# Patient Record
Sex: Female | Born: 1950 | Race: White | Hispanic: No | Marital: Single | Smoking: Former smoker
Health system: Southern US, Community
[De-identification: ages and names within clinical notes are randomized; demographics above are authoritative.]

## PROBLEM LIST (undated history)

## (undated) DIAGNOSIS — I639 Cerebral infarction, unspecified: Secondary | ICD-10-CM

## (undated) DIAGNOSIS — J189 Pneumonia, unspecified organism: Secondary | ICD-10-CM

## (undated) DIAGNOSIS — I10 Essential (primary) hypertension: Secondary | ICD-10-CM

## (undated) DIAGNOSIS — I219 Acute myocardial infarction, unspecified: Secondary | ICD-10-CM

## (undated) DIAGNOSIS — R092 Respiratory arrest: Secondary | ICD-10-CM

## (undated) DIAGNOSIS — M797 Fibromyalgia: Secondary | ICD-10-CM

## (undated) DIAGNOSIS — M353 Polymyalgia rheumatica: Secondary | ICD-10-CM

## (undated) DIAGNOSIS — Z9289 Personal history of other medical treatment: Secondary | ICD-10-CM

## (undated) DIAGNOSIS — I200 Unstable angina: Secondary | ICD-10-CM

## (undated) DIAGNOSIS — I2510 Atherosclerotic heart disease of native coronary artery without angina pectoris: Secondary | ICD-10-CM

## (undated) DIAGNOSIS — I509 Heart failure, unspecified: Secondary | ICD-10-CM

## (undated) DIAGNOSIS — J449 Chronic obstructive pulmonary disease, unspecified: Secondary | ICD-10-CM

## (undated) DIAGNOSIS — Z720 Tobacco use: Secondary | ICD-10-CM

## (undated) DIAGNOSIS — F329 Major depressive disorder, single episode, unspecified: Secondary | ICD-10-CM

## (undated) DIAGNOSIS — F32A Depression, unspecified: Secondary | ICD-10-CM

## (undated) DIAGNOSIS — I739 Peripheral vascular disease, unspecified: Secondary | ICD-10-CM

## (undated) DIAGNOSIS — K219 Gastro-esophageal reflux disease without esophagitis: Secondary | ICD-10-CM

## (undated) DIAGNOSIS — H9190 Unspecified hearing loss, unspecified ear: Secondary | ICD-10-CM

## (undated) HISTORY — DX: Peripheral vascular disease, unspecified: I73.9

## (undated) HISTORY — DX: Personal history of other medical treatment: Z92.89

## (undated) HISTORY — PX: EYE SURGERY: SHX253

## (undated) HISTORY — DX: Tobacco use: Z72.0

---

## 1993-03-14 HISTORY — PX: CORONARY ANGIOPLASTY: SHX604

## 1993-04-03 HISTORY — PX: CORONARY ARTERY BYPASS GRAFT: SHX141

## 1996-03-14 HISTORY — PX: CORONARY ANGIOPLASTY: SHX604

## 1997-03-14 HISTORY — PX: CORONARY ANGIOPLASTY: SHX604

## 1997-09-25 ENCOUNTER — Inpatient Hospital Stay (HOSPITAL_COMMUNITY): Admission: EM | Admit: 1997-09-25 | Discharge: 1997-09-27 | Payer: Self-pay | Admitting: Emergency Medicine

## 1998-07-27 ENCOUNTER — Encounter: Payer: Self-pay | Admitting: Gastroenterology

## 1998-07-27 ENCOUNTER — Ambulatory Visit (HOSPITAL_COMMUNITY): Admission: RE | Admit: 1998-07-27 | Discharge: 1998-07-27 | Payer: Self-pay | Admitting: Gastroenterology

## 1998-07-28 ENCOUNTER — Encounter: Payer: Self-pay | Admitting: Gastroenterology

## 1998-07-28 ENCOUNTER — Ambulatory Visit (HOSPITAL_COMMUNITY): Admission: RE | Admit: 1998-07-28 | Discharge: 1998-07-28 | Payer: Self-pay | Admitting: Gastroenterology

## 2000-09-07 ENCOUNTER — Inpatient Hospital Stay (HOSPITAL_COMMUNITY): Admission: EM | Admit: 2000-09-07 | Discharge: 2000-09-09 | Payer: Self-pay | Admitting: Emergency Medicine

## 2000-09-08 HISTORY — PX: CORONARY ANGIOPLASTY: SHX604

## 2000-10-19 ENCOUNTER — Other Ambulatory Visit: Admission: RE | Admit: 2000-10-19 | Discharge: 2000-10-19 | Payer: Self-pay | Admitting: *Deleted

## 2001-07-18 ENCOUNTER — Encounter: Payer: Self-pay | Admitting: *Deleted

## 2001-07-18 ENCOUNTER — Encounter: Admission: RE | Admit: 2001-07-18 | Discharge: 2001-07-18 | Payer: Self-pay | Admitting: *Deleted

## 2001-10-24 ENCOUNTER — Encounter: Admission: RE | Admit: 2001-10-24 | Discharge: 2001-10-24 | Payer: Self-pay | Admitting: Pulmonary Disease

## 2002-06-06 ENCOUNTER — Encounter: Payer: Self-pay | Admitting: Specialist

## 2002-06-10 ENCOUNTER — Ambulatory Visit (HOSPITAL_COMMUNITY): Admission: RE | Admit: 2002-06-10 | Discharge: 2002-06-10 | Payer: Self-pay | Admitting: Specialist

## 2002-10-28 ENCOUNTER — Encounter (HOSPITAL_COMMUNITY): Admission: RE | Admit: 2002-10-28 | Discharge: 2003-01-26 | Payer: Self-pay | Admitting: Cardiovascular Disease

## 2002-12-03 ENCOUNTER — Ambulatory Visit (HOSPITAL_COMMUNITY): Admission: RE | Admit: 2002-12-03 | Discharge: 2002-12-04 | Payer: Self-pay | Admitting: Cardiovascular Disease

## 2002-12-03 HISTORY — PX: CORONARY ANGIOPLASTY: SHX604

## 2004-06-15 ENCOUNTER — Emergency Department (HOSPITAL_COMMUNITY): Admission: EM | Admit: 2004-06-15 | Discharge: 2004-06-15 | Payer: Self-pay | Admitting: Emergency Medicine

## 2004-09-15 ENCOUNTER — Observation Stay (HOSPITAL_COMMUNITY): Admission: EM | Admit: 2004-09-15 | Discharge: 2004-09-18 | Payer: Self-pay | Admitting: Emergency Medicine

## 2004-09-17 HISTORY — PX: CORONARY ANGIOPLASTY: SHX604

## 2004-11-19 ENCOUNTER — Encounter: Admission: RE | Admit: 2004-11-19 | Discharge: 2004-11-19 | Payer: Self-pay | Admitting: Internal Medicine

## 2004-12-06 ENCOUNTER — Ambulatory Visit: Payer: Self-pay | Admitting: Gastroenterology

## 2004-12-10 ENCOUNTER — Ambulatory Visit: Payer: Self-pay | Admitting: Gastroenterology

## 2004-12-10 ENCOUNTER — Encounter (INDEPENDENT_AMBULATORY_CARE_PROVIDER_SITE_OTHER): Payer: Self-pay | Admitting: Specialist

## 2004-12-17 ENCOUNTER — Ambulatory Visit: Payer: Self-pay | Admitting: Gastroenterology

## 2004-12-21 ENCOUNTER — Ambulatory Visit: Payer: Self-pay | Admitting: Gastroenterology

## 2005-02-22 ENCOUNTER — Ambulatory Visit: Payer: Self-pay | Admitting: Gastroenterology

## 2005-04-02 ENCOUNTER — Emergency Department (HOSPITAL_COMMUNITY): Admission: EM | Admit: 2005-04-02 | Discharge: 2005-04-02 | Payer: Self-pay | Admitting: *Deleted

## 2005-04-19 ENCOUNTER — Ambulatory Visit: Payer: Self-pay | Admitting: Gastroenterology

## 2005-09-27 ENCOUNTER — Other Ambulatory Visit: Admission: RE | Admit: 2005-09-27 | Discharge: 2005-09-27 | Payer: Self-pay | Admitting: Obstetrics and Gynecology

## 2005-10-17 ENCOUNTER — Encounter
Admission: RE | Admit: 2005-10-17 | Discharge: 2005-10-17 | Payer: Self-pay | Admitting: Physical Medicine and Rehabilitation

## 2005-11-22 ENCOUNTER — Encounter
Admission: RE | Admit: 2005-11-22 | Discharge: 2005-11-22 | Payer: Self-pay | Admitting: Physical Medicine and Rehabilitation

## 2005-11-29 ENCOUNTER — Encounter
Admission: RE | Admit: 2005-11-29 | Discharge: 2005-11-29 | Payer: Self-pay | Admitting: Physical Medicine and Rehabilitation

## 2006-03-18 ENCOUNTER — Emergency Department (HOSPITAL_COMMUNITY): Admission: EM | Admit: 2006-03-18 | Discharge: 2006-03-18 | Payer: Self-pay | Admitting: Emergency Medicine

## 2006-03-21 ENCOUNTER — Inpatient Hospital Stay (HOSPITAL_COMMUNITY): Admission: EM | Admit: 2006-03-21 | Discharge: 2006-03-23 | Payer: Self-pay | Admitting: Emergency Medicine

## 2006-03-22 HISTORY — PX: CORONARY ANGIOPLASTY: SHX604

## 2006-07-17 ENCOUNTER — Ambulatory Visit: Payer: Self-pay | Admitting: Gastroenterology

## 2006-07-17 LAB — CONVERTED CEMR LAB
ALT: 17 units/L (ref 0–40)
AST: 26 units/L (ref 0–37)
Alkaline Phosphatase: 100 units/L (ref 39–117)
BUN: 9 mg/dL (ref 6–23)
Bilirubin, Direct: 0.1 mg/dL (ref 0.0–0.3)
Eosinophils Relative: 0.6 % (ref 0.0–5.0)
Glucose, Bld: 202 mg/dL — ABNORMAL HIGH (ref 70–99)
HCT: 36.6 % (ref 36.0–46.0)
Lymphocytes Relative: 42.7 % (ref 12.0–46.0)
MCHC: 34.8 g/dL (ref 30.0–36.0)
Monocytes Absolute: 0.7 10*3/uL (ref 0.2–0.7)
Monocytes Relative: 8.6 % (ref 3.0–11.0)
Neutro Abs: 3.8 10*3/uL (ref 1.4–7.7)
Platelets: 415 10*3/uL — ABNORMAL HIGH (ref 150–400)
Potassium: 4.2 meq/L (ref 3.5–5.1)
Sodium: 138 meq/L (ref 135–145)
TSH: 5.02 microintl units/mL (ref 0.35–5.50)
Total Bilirubin: 0.6 mg/dL (ref 0.3–1.2)

## 2006-07-19 ENCOUNTER — Ambulatory Visit: Payer: Self-pay | Admitting: Gastroenterology

## 2006-07-26 ENCOUNTER — Ambulatory Visit: Payer: Self-pay | Admitting: Gastroenterology

## 2006-08-08 ENCOUNTER — Ambulatory Visit: Payer: Self-pay | Admitting: Gastroenterology

## 2006-08-14 ENCOUNTER — Ambulatory Visit: Payer: Self-pay | Admitting: Cardiovascular Disease

## 2006-09-12 ENCOUNTER — Ambulatory Visit (HOSPITAL_COMMUNITY): Admission: RE | Admit: 2006-09-12 | Discharge: 2006-09-12 | Payer: Self-pay | Admitting: Internal Medicine

## 2006-10-04 ENCOUNTER — Ambulatory Visit: Payer: Self-pay | Admitting: Gastroenterology

## 2006-10-12 ENCOUNTER — Ambulatory Visit (HOSPITAL_COMMUNITY): Admission: RE | Admit: 2006-10-12 | Discharge: 2006-10-12 | Payer: Self-pay | Admitting: Gastroenterology

## 2006-10-12 ENCOUNTER — Encounter: Payer: Self-pay | Admitting: Gastroenterology

## 2006-10-16 ENCOUNTER — Ambulatory Visit: Payer: Self-pay | Admitting: Gastroenterology

## 2007-02-13 ENCOUNTER — Ambulatory Visit: Payer: Self-pay | Admitting: Gastroenterology

## 2007-04-10 ENCOUNTER — Ambulatory Visit (HOSPITAL_COMMUNITY): Admission: AD | Admit: 2007-04-10 | Discharge: 2007-04-11 | Payer: Self-pay | Admitting: Cardiovascular Disease

## 2007-04-10 HISTORY — PX: CARDIAC CATHETERIZATION: SHX172

## 2007-04-20 ENCOUNTER — Inpatient Hospital Stay (HOSPITAL_COMMUNITY): Admission: EM | Admit: 2007-04-20 | Discharge: 2007-04-21 | Payer: Self-pay | Admitting: Emergency Medicine

## 2007-04-20 HISTORY — PX: CORONARY ANGIOPLASTY: SHX604

## 2008-07-17 ENCOUNTER — Inpatient Hospital Stay (HOSPITAL_COMMUNITY): Admission: AD | Admit: 2008-07-17 | Discharge: 2008-07-19 | Payer: Self-pay | Admitting: Cardiovascular Disease

## 2008-07-18 HISTORY — PX: CORONARY ANGIOPLASTY: SHX604

## 2008-07-24 ENCOUNTER — Telehealth: Payer: Self-pay | Admitting: Gastroenterology

## 2008-07-29 ENCOUNTER — Encounter (INDEPENDENT_AMBULATORY_CARE_PROVIDER_SITE_OTHER): Payer: Self-pay | Admitting: *Deleted

## 2008-08-05 ENCOUNTER — Encounter: Payer: Self-pay | Admitting: Gastroenterology

## 2009-01-08 ENCOUNTER — Encounter: Admission: RE | Admit: 2009-01-08 | Discharge: 2009-01-08 | Payer: Self-pay | Admitting: Cardiovascular Disease

## 2009-04-20 IMAGING — CR DG CHEST 2V
2 series · 2 of 2 positions shown · non-contrast
Comparison: 03/21/2006

CLINICAL DATA: Precardiac catheterization. Dyspnea. Chest pain.

CHEST - 2 VIEW

[view not recorded (1 of 2)]
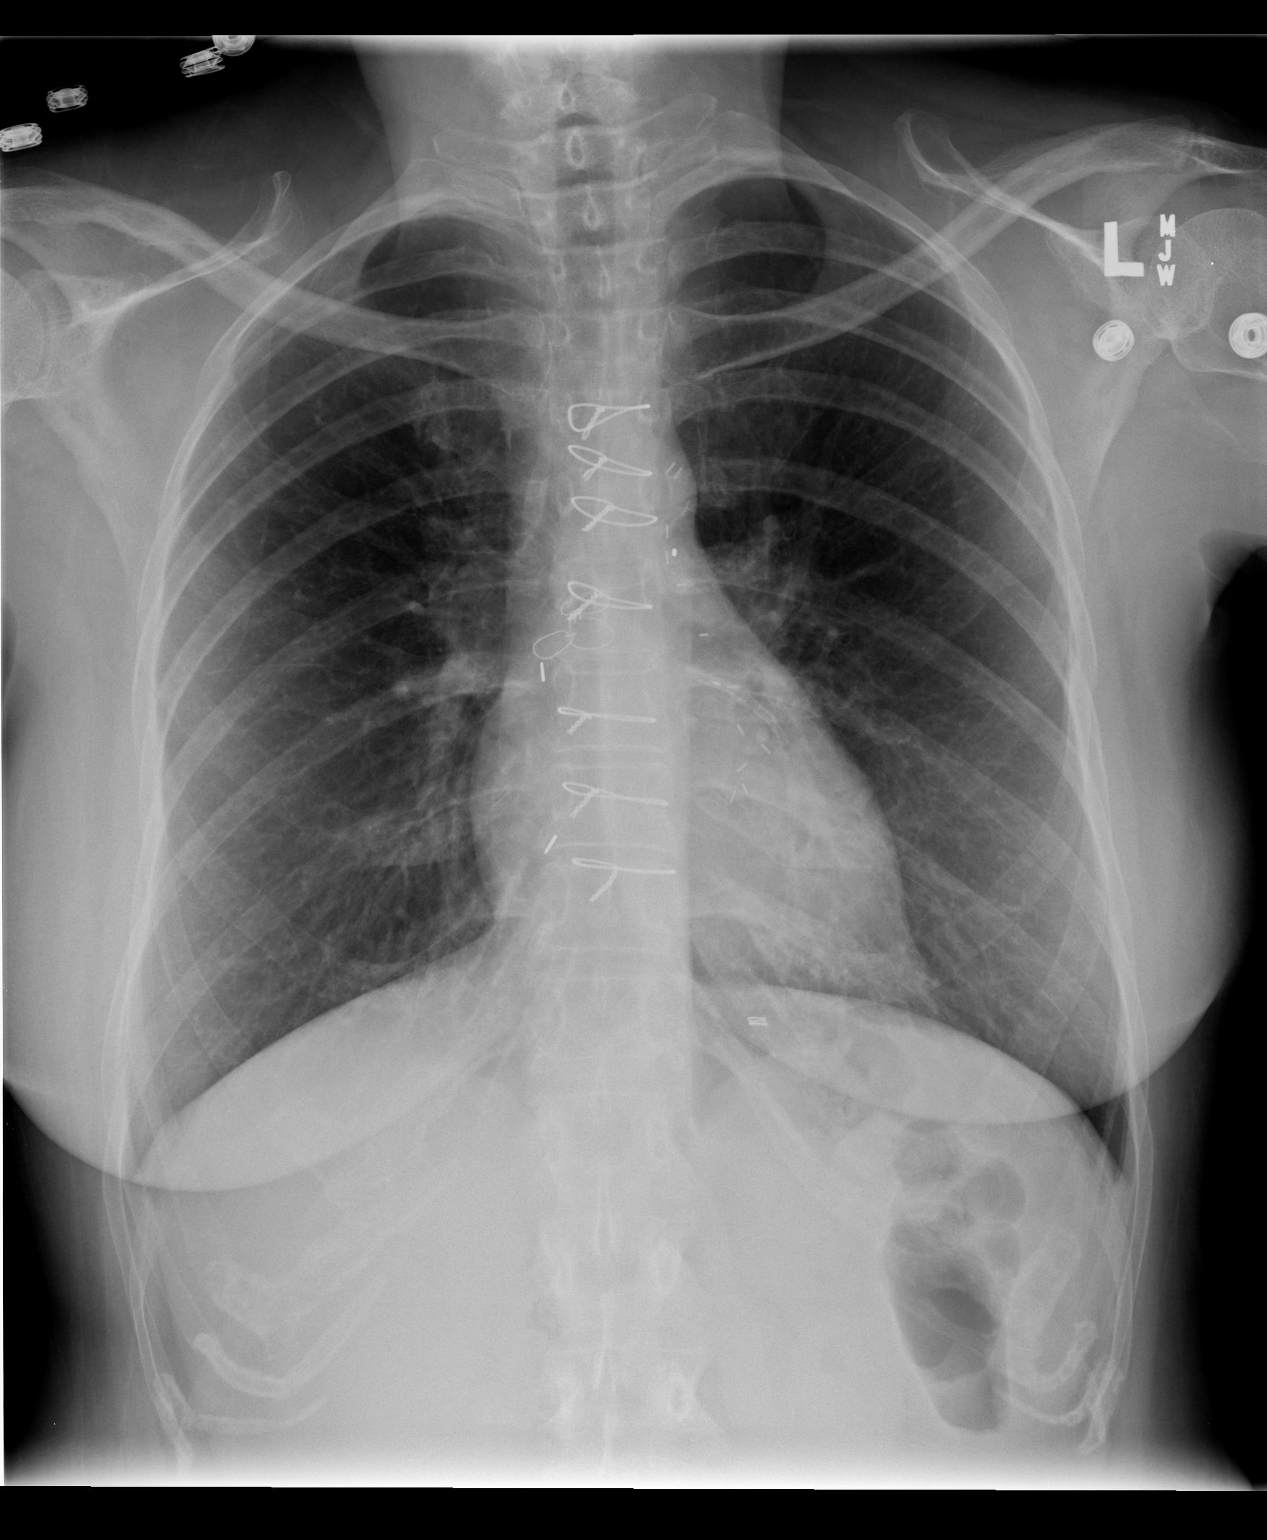

[view not recorded (2 of 2)]
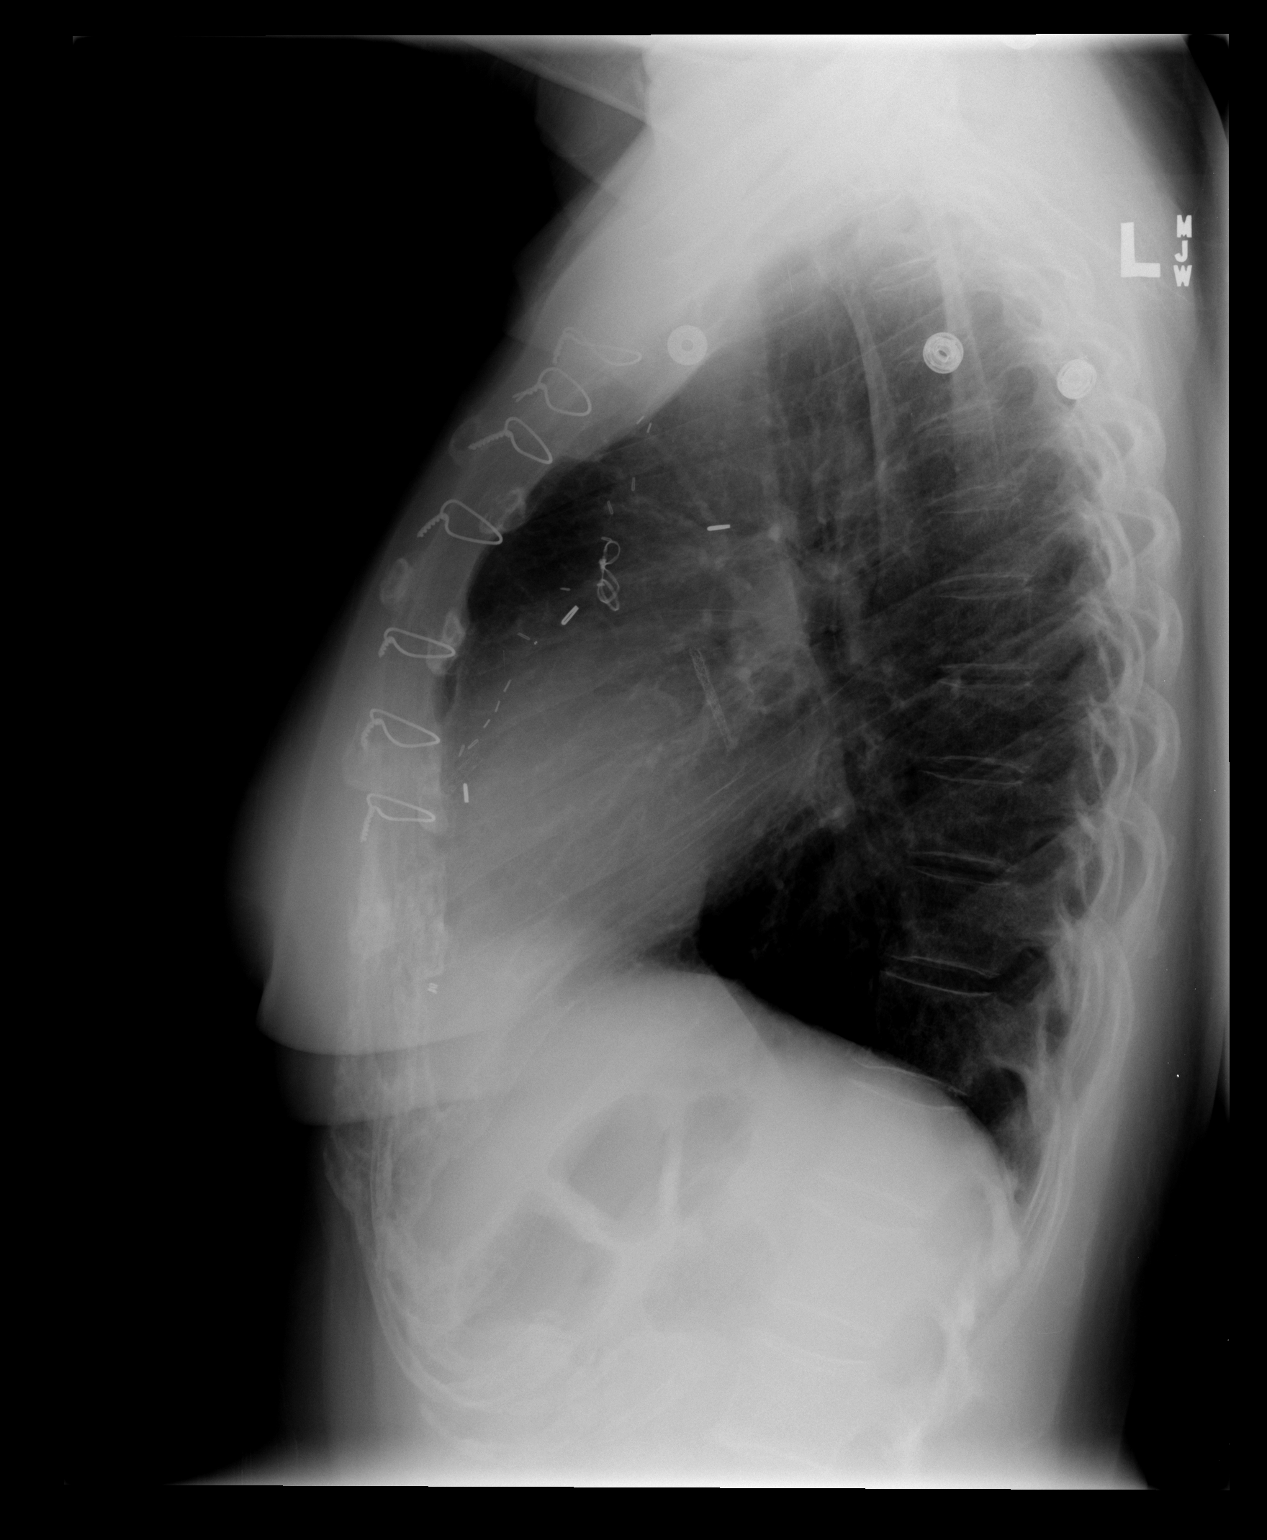

[2 of 2 positions shown; findings below may reference images not displayed]

FINDINGS: Prior median sternotomy and CABG. Mild peribronchial thickening.
Minimal scar at the left lung base.

Midline trachea. Normal heart size and mediastinal contours. Sharp costophrenic
angles. No pneumothorax or congestive failure. 

IMPRESSION

1. No acute cardiopulmonary disease.

## 2009-05-14 ENCOUNTER — Ambulatory Visit (HOSPITAL_COMMUNITY): Admission: RE | Admit: 2009-05-14 | Discharge: 2009-05-14 | Payer: Self-pay | Admitting: Orthopedic Surgery

## 2009-05-15 ENCOUNTER — Encounter: Admission: RE | Admit: 2009-05-15 | Discharge: 2009-08-07 | Payer: Self-pay | Admitting: Orthopedic Surgery

## 2009-11-26 ENCOUNTER — Encounter: Admission: RE | Admit: 2009-11-26 | Discharge: 2009-11-26 | Payer: Self-pay | Admitting: Endocrinology

## 2009-12-02 ENCOUNTER — Emergency Department (HOSPITAL_COMMUNITY): Admission: EM | Admit: 2009-12-02 | Discharge: 2009-12-02 | Payer: Self-pay | Admitting: Emergency Medicine

## 2010-04-04 ENCOUNTER — Encounter: Payer: Self-pay | Admitting: Pulmonary Disease

## 2010-04-04 ENCOUNTER — Encounter: Payer: Self-pay | Admitting: Cardiovascular Disease

## 2010-04-04 ENCOUNTER — Encounter: Payer: Self-pay | Admitting: Physical Medicine and Rehabilitation

## 2010-04-15 NOTE — Letter (Signed)
Summary: Office Note/[facility]  Office Note/[facility]   Imported By: Sherian Rein 08/12/2008 10:28:03  _____________________________________________________________________  External Attachment:    Type:   Image     Comment:   External Document

## 2010-05-19 ENCOUNTER — Emergency Department (HOSPITAL_COMMUNITY): Payer: Medicare Other

## 2010-05-19 ENCOUNTER — Inpatient Hospital Stay (HOSPITAL_COMMUNITY)
Admission: EM | Admit: 2010-05-19 | Discharge: 2010-05-21 | DRG: 287 | Disposition: A | Discharge status: Home / Self Care | Payer: Medicare Other | Attending: Cardiovascular Disease | Admitting: Cardiovascular Disease

## 2010-05-19 DIAGNOSIS — I10 Essential (primary) hypertension: Secondary | ICD-10-CM | POA: Diagnosis present

## 2010-05-19 DIAGNOSIS — I2510 Atherosclerotic heart disease of native coronary artery without angina pectoris: Principal | ICD-10-CM | POA: Diagnosis present

## 2010-05-19 DIAGNOSIS — H409 Unspecified glaucoma: Secondary | ICD-10-CM | POA: Diagnosis present

## 2010-05-19 DIAGNOSIS — Z7902 Long term (current) use of antithrombotics/antiplatelets: Secondary | ICD-10-CM

## 2010-05-19 DIAGNOSIS — F17200 Nicotine dependence, unspecified, uncomplicated: Secondary | ICD-10-CM | POA: Diagnosis present

## 2010-05-19 DIAGNOSIS — F329 Major depressive disorder, single episode, unspecified: Secondary | ICD-10-CM | POA: Diagnosis present

## 2010-05-19 DIAGNOSIS — Z951 Presence of aortocoronary bypass graft: Secondary | ICD-10-CM

## 2010-05-19 DIAGNOSIS — E1149 Type 2 diabetes mellitus with other diabetic neurological complication: Secondary | ICD-10-CM | POA: Diagnosis present

## 2010-05-19 DIAGNOSIS — E785 Hyperlipidemia, unspecified: Secondary | ICD-10-CM | POA: Diagnosis present

## 2010-05-19 DIAGNOSIS — I200 Unstable angina: Secondary | ICD-10-CM | POA: Diagnosis present

## 2010-05-19 DIAGNOSIS — F3289 Other specified depressive episodes: Secondary | ICD-10-CM | POA: Diagnosis present

## 2010-05-19 DIAGNOSIS — E1142 Type 2 diabetes mellitus with diabetic polyneuropathy: Secondary | ICD-10-CM | POA: Diagnosis present

## 2010-05-19 DIAGNOSIS — F411 Generalized anxiety disorder: Secondary | ICD-10-CM | POA: Diagnosis present

## 2010-05-19 DIAGNOSIS — Z7982 Long term (current) use of aspirin: Secondary | ICD-10-CM

## 2010-05-19 LAB — COMPREHENSIVE METABOLIC PANEL
Alkaline Phosphatase: 67 U/L (ref 39–117)
BUN: 14 mg/dL (ref 6–23)
CO2: 29 mEq/L (ref 19–32)
Chloride: 99 mEq/L (ref 96–112)
Glucose, Bld: 362 mg/dL — ABNORMAL HIGH (ref 70–99)
Potassium: 3.9 mEq/L (ref 3.5–5.1)
Total Bilirubin: 0.2 mg/dL — ABNORMAL LOW (ref 0.3–1.2)

## 2010-05-19 LAB — POCT CARDIAC MARKERS: Myoglobin, poc: 36.4 ng/mL (ref 12–200)

## 2010-05-19 LAB — CBC
Hemoglobin: 11.8 g/dL — ABNORMAL LOW (ref 12.0–15.0)
MCH: 28.6 pg (ref 26.0–34.0)
MCHC: 34.3 g/dL (ref 30.0–36.0)
Platelets: 411 10*3/uL — ABNORMAL HIGH (ref 150–400)
RBC: 4.12 MIL/uL (ref 3.87–5.11)
WBC: 11.8 10*3/uL — ABNORMAL HIGH (ref 4.0–10.5)

## 2010-05-19 LAB — DIFFERENTIAL
Eosinophils Relative: 0 % (ref 0–5)
Lymphocytes Relative: 41 % (ref 12–46)
Neutro Abs: 6.1 10*3/uL (ref 1.7–7.7)

## 2010-05-20 LAB — LIPID PANEL: HDL: 89 mg/dL (ref >39)

## 2010-05-20 LAB — CK TOTAL AND CKMB (NOT AT ARMC)
Relative Index: INVALID (ref 0.0–2.5)
Total CK: 40 U/L (ref 7–177)

## 2010-05-20 LAB — GLUCOSE, CAPILLARY
Glucose-Capillary: 486 mg/dL — ABNORMAL HIGH (ref 70–99)
Glucose-Capillary: 370 mg/dL — ABNORMAL HIGH (ref 70–99)

## 2010-05-20 LAB — HEPATIC FUNCTION PANEL
ALT: 14 U/L (ref 0–35)
Alkaline Phosphatase: 66 U/L (ref 39–117)
Total Protein: 6.1 g/dL (ref 6.0–8.3)

## 2010-05-20 LAB — CARDIAC PANEL(CRET KIN+CKTOT+MB+TROPI)
Relative Index: INVALID (ref 0.0–2.5)
Troponin I: 0.02 ng/mL (ref 0.00–0.06)

## 2010-05-20 LAB — MAGNESIUM: Magnesium: 2.2 mg/dL (ref 1.5–2.5)

## 2010-05-20 LAB — POCT CARDIAC MARKERS: Myoglobin, poc: 42.2 ng/mL (ref 12–200)

## 2010-05-20 LAB — TSH: TSH: 3.484 u[IU]/mL (ref 0.350–4.500)

## 2010-05-20 LAB — TROPONIN I: Troponin I: 0.03 ng/mL (ref 0.00–0.06)

## 2010-05-20 LAB — PROTIME-INR: INR: 0.99 (ref 0.00–1.49)

## 2010-05-21 HISTORY — PX: CORONARY ANGIOPLASTY: SHX604

## 2010-05-21 LAB — CBC
Hemoglobin: 11.9 g/dL — ABNORMAL LOW (ref 12.0–15.0)
MCHC: 35.7 g/dL (ref 30.0–36.0)
Platelets: 366 10*3/uL (ref 150–400)
RDW: 13.1 % (ref 11.5–15.5)

## 2010-05-21 LAB — BASIC METABOLIC PANEL
BUN: 17 mg/dL (ref 6–23)
Calcium: 8.8 mg/dL (ref 8.4–10.5)
GFR calc non Af Amer: >60 mL/min (ref >60)
Glucose, Bld: 302 mg/dL — ABNORMAL HIGH (ref 70–99)

## 2010-05-21 LAB — PROTIME-INR
INR: 0.96 (ref 0.00–1.49)
Prothrombin Time: 13 seconds (ref 11.6–15.2)

## 2010-05-26 NOTE — Procedures (Signed)
NAMESAMARIYA, ROCKHOLD                   ACCOUNT NO.:  [id number]  MEDICAL RECORD NO.:  [id number]           PATIENT TYPE:  I  LOCATION:  3741                         FACILITY:  [facility]  PHYSICIAN:  Italy Hilty, MD         DATE OF BIRTH:  05/08/1950  DATE OF PROCEDURE:  05/21/2010 DATE OF DISCHARGE:  05/21/2010                           CARDIAC CATHETERIZATION   PROCEDURE:  This is a left heart catheterization.  OPERATOR:  Italy Hilty, MD  INDICATION:  Chest pain.  HISTORY OF PRESENT ILLNESS:  Ms. Reller is a 60 year old female with a significant history of coronary disease status post CABG approximately 15 years ago with  LIMA to LAD, saphenous vein graft to OM1, saphenous vein graft to a diagonal, and saphenous vein graft to the right coronary artery.  She had undergone cardiac catheterization in 2010, which showed essentially patent grafts.  Today, she is again referred for cardiac catheterization.  PROCEDURE:  The patient was brought to the cardiac catheterization lab, sterilely prepped and draped in usual fashion after procedural and radiation time-out.  Area around the right femoral artery was identified and anesthetized with about a 5 mL of 1% lidocaine.  The patient was given 1 mg of Versed and 50 mcg fentanyl for sedation.  The 5-French femoral artery was accessed with a straight wire and needle and after placement of a 5-French sheath, sequential catheterization using a pigtail catheter, 5-French JR-4, JL-4, and the internal mammary catheter was performed.  A left ventriculogram was obtained.  Estimated blood loss was less than 10 mL.  There were no acute complications.  The patient did receive 0.22 tablets of 0.4 mg sublingual nitro for a hypertensive blood pressure of 180 systolic.  FINDINGS:  Native vessels - 1. Left main - patent. 2. LAD - occluded at distal end of the proximal stent. 3. Left circumflex - flush occluded at left main. 4. Right coronary artery - proximal  100% occluded. 5. LVEDP = 14 mmHg. 6. LV gram - EF 60%, 1+ MR, no wall motion abnormalities.  Grafts - 1. LIMA to LAD - patent with good distal runoff to a small vessel     toward the apex of the left ventricle. 2. SVG to OM1 - patent with good distal runoff. 3. SVG to D1 - patent, good distal runoff. 4. SVG to RCA - patent with a mid graft stent and no significant in-     stent restenosis with good distal runoff.  The distal PDA has a     long tubular 50% lesion of the vessel that is approximately 1.5 mm     and unable to be intervened upon.  IMPRESSION: 1. A patent LIMA, SVG to OM1, RCA, D1. 2. LVEDP = 14 mmHg. 3. Hypertensive with blood pressure 180 systolic.  PLAN:  I suspect her pain is likely due to hypertension and microvascular angina.  We will attempt to improve blood pressure control and would suggest a trial of Ranexa 500 b.i.d. provided she has not been intolerant to this medication in the past.     Italy Hilty, MD  CH/MEDQ  D:  05/21/2010  T:  05/22/2010  Job:  [id number]  cc:   Gerlene Burdock A. Alanda Amass, M.D. Edward L. Juanetta Gosling, M.D.  Electronically Signed by Kirtland Bouchard. HILTY M.D. on 05/26/2010 08:11:01 AM

## 2010-05-26 NOTE — Discharge Summary (Signed)
NAMESANTANA, GOSDIN                   ACCOUNT NO.:  [id number]  MEDICAL RECORD NO.:  [id number]           PATIENT TYPE:  I  LOCATION:  3741                         FACILITY:  [facility]  PHYSICIAN:  Italy Staphanie Harbison, MD         DATE OF BIRTH:  03-16-1950  DATE OF ADMISSION:  05/19/2010 DATE OF DISCHARGE:  05/21/2010                              DISCHARGE SUMMARY   DISCHARGE DIAGNOSES: 1. Unstable angina, negative cardiac enzymes. 2. History of CAD with five-vessel coronary artery bypass grafting in     1995. 3. Diabetes mellitus type 2. 4. Tobacco abuse. 5. Depression. 6. Anxiety disorder. 7. Glaucoma.  HOSPITAL COURSE:  Ms. [name] is a 60 year old Caucasian female with significant coronary history including five-vessel coronary artery bypass grafting in 1995 as well as multiple cardiac catheterizations since.  She also has a history of current tobacco use, diabetes mellitus type 2, depression, anxiety, glaucoma.  Ms. Soderman presents to the [facility] ED via EMS after having crushing 10/10 substernal chest pain with radiation to her jaw.  She describes two episodes.  First lasted 5 minutes and was decreased with sublingual nitro x2.  Second episode lasted 35-40 minutes, promptly ED evaluation.  A 2-D echocardiogram was ordered as well as admission cardiac enzymes were cycled.  Hemoglobin A1c was checked with lipid assay.  Smoking cessation was ordered.  She was started on subcu Lovenox for DVT prophylaxis.  She was scheduled for left heart catheterization on March 9.  A 2-D echocardiogram showed an ejection fraction of 55%-60% stage II diastolic dysfunction.  No regional wall motion abnormalities and normal systolic function.  Peak PA pressure 34 mmHg.  Left heart cath showed patent LIMA graft, SVG to the OM, RCA diagonal.  Left ventricular end-diastolic pressure was 14 mmHg and she was hypertensive with a systolic pressure of 180.  SVG to the diagonal and RCA were all patent.  Suspected this was a  case of microvascular angina.  At this time, the patient feels much better.  She wants to go home.  She had been seen by Dr. Rennis Golden, who feel she is stable to do so.  She will follow up with Dr. Alanda Amass in approximately 2 weeks for adjustment of blood pressure medications.  DISCHARGE LABS:  WBC is 14.7, hemoglobin 11.9, hematocrit 33.3, platelets 366.  PT 13.0, INR 0.96.  Sodium 134, potassium 4.3, chloride 96, carbon dioxide 32, glucose 302, BUN 17, creatinine 0.92, total bilirubin 0.3, direct bilirubin less than 0.1, alk phos 66, AST 18, ALT 14, total protein 6.1, albumin 3.6, calcium 8.8, magnesium 2.2, hemoglobin A1c 10.5, mean plasma glucose 255.  Cardiac enzymes negative. BNP was 109.  Total cholesterol 181, triglycerides 117, HDL 89, LDL 69, total cholesterol-HDL ratio was 2.0.  Thyroid stimulating hormone was 3.484.  She was MRSA by PCR negative.  STUDIES/PROCEDURES: 1. Chest x-ray, March 7, bibasilar atelectasis or scarring. 2. Echocardiogram, May 20, 2010, left ventricle cavity size was     normal with mild concentric hypertrophy.  Systolic function was     normal.  Ejection fraction was in the range of  55%-60%.  Wall     motion was normal.  There was no wall motion abnormalities.     Doppler parameters were consistent with an abnormal left     ventricular relaxation (stage II diastolic dysfunction).  The E/E     prime is greater than 15 suggesting elevated LV filling pressure.     Aortic valve sclerosis without significant stenosis.  Peak and mean     gradients across the valve are 15 and 9 mmHg respectively.  There     was trace of mild aortic insufficiency.  Mitral valve showed     calcified annulus and mild regurgitation.  Left atrium was mildly     dilated.  Pulmonary artery peak pressure was 34 mmHg.  The inferior     vena cava vessel was normal in size. 3. Cardiac catheterization, May 21, 2010, shows patent LIMA to the     LAD as well as patent SVGs to the OM1, D1,  and the RCA, also patent     stent in the RCA.  DISCHARGE MEDICATIONS: 1. Aspirin 325 mg enteric-coated 1 tablet by mouth daily. 2. Lumigan ophthalmic solution 0.03% 1 drop in both eyes daily. 3. Famciclovir 500 mg enteric-coated, 1 tablet by mouth three times a     day. 4. Pantoprazole 40 mg enteric-coated, 1 tablet by mouth twice daily     before meals. 5. Simvastatin 40 mg 1 tablet by mouth daily at bedtime. 6. Timolol 0.25%  1 drop in both eyes daily. 7. Amitriptyline 100 mg 1 tablet by mouth every evening. 8. Calcium carbonate over-the-counter 1 tablet by mouth daily. 9. Coreg 25 mg 1 tablet by mouth twice daily. 10.Diovan 40 mg 1 tablet by mouth daily at bedtime. 11.Fish oil over-the-counter 1 tablet by mouth daily. 12.Humulin R sliding scale 5-12 units subcutaneously three times a day     before meals as needed. 13.Vicodin 7.5/325 mg 1 tablet by mouth every 4 hours as needed for     pain. 14.Levemir 26 units in the morning, 10 units in the evening     subcutaneously. 15.Imdur XR 60 mg 1 tablet by mouth daily. 16.Lasix 20 mg 1 tablet by mouth daily. 17.Lyrica 50 mg 1 tablet by mouth twice daily. 18.Multivitamin over-the-counter 1 tablet by mouth daily. 19.Plavix 75 mg 1 tablet by mouth daily. 20.Prednisone 10 mg 3 tablets by mouth daily.  The patient is on a     dose taper pack. 21.Xanax 0.5 mg 1 tablet by mouth daily as needed for anxiety. 22.Zoloft 50 mg 1 tablet mouth daily.  DISPOSITION:  Ms. Schauer will be discharged home in stable condition.  She is to increase her activity slowly.  She may shower and bathe.  No lifting or driving for 2 days.  She is to eat a low-sodium heart-healthy diet.  If the catheter site becomes red, painful, swollen, or discharges fluids, she is to call our office, [phone].  She will follow up with Dr. Alanda Amass on Monday March 19 at 1:30 p.m.    ______________________________ Wilburt Finlay, PA   ______________________________ [place]  Leala Bryand, MD   BH/MEDQ  D:  05/21/2010  T:  05/22/2010  Job:  [id number]  cc:   Gerlene Burdock A. Alanda Amass, M.D.  Electronically Signed by Wilburt Finlay PA on 05/25/2010 03:23:52 PM Electronically Signed by Kirtland Bouchard. Tavoris Brisk M.D. on 05/26/2010 08:11:07 AM

## 2010-05-27 LAB — DIFFERENTIAL
Basophils Relative: 0 % (ref 0–1)
Monocytes Absolute: 1 10*3/uL (ref 0.1–1.0)
Monocytes Relative: 4 % (ref 3–12)
Neutro Abs: 19.4 10*3/uL — ABNORMAL HIGH (ref 1.7–7.7)

## 2010-05-27 LAB — BASIC METABOLIC PANEL
CO2: 24 mEq/L (ref 19–32)
GFR calc non Af Amer: >60 mL/min (ref >60)
Glucose, Bld: 265 mg/dL — ABNORMAL HIGH (ref 70–99)
Potassium: 3.3 mEq/L — ABNORMAL LOW (ref 3.5–5.1)
Sodium: 132 mEq/L — ABNORMAL LOW (ref 135–145)

## 2010-05-27 LAB — URINALYSIS, ROUTINE W REFLEX MICROSCOPIC
Bilirubin Urine: NEGATIVE
Hgb urine dipstick: NEGATIVE
Specific Gravity, Urine: 1.009 (ref 1.005–1.030)
pH: 7 (ref 5.0–8.0)

## 2010-05-27 LAB — CBC
HCT: 36.2 % (ref 36.0–46.0)
Hemoglobin: 13.2 g/dL (ref 12.0–15.0)
MCHC: 36.5 g/dL — ABNORMAL HIGH (ref 30.0–36.0)

## 2010-05-27 LAB — POCT CARDIAC MARKERS
CKMB, poc: <1 ng/mL — ABNORMAL LOW (ref 1.0–8.0)
Myoglobin, poc: 31.9 ng/mL (ref 12–200)
Troponin i, poc: <0.05 ng/mL (ref 0.00–0.09)

## 2010-06-02 LAB — BASIC METABOLIC PANEL
CO2: 28 mEq/L (ref 19–32)
Calcium: 9.4 mg/dL (ref 8.4–10.5)
GFR calc Af Amer: >60 mL/min (ref >60)
GFR calc non Af Amer: >60 mL/min (ref >60)
Sodium: 133 mEq/L — ABNORMAL LOW (ref 135–145)

## 2010-06-02 LAB — CBC
Hemoglobin: 11.9 g/dL — ABNORMAL LOW (ref 12.0–15.0)
RBC: 3.73 MIL/uL — ABNORMAL LOW (ref 3.87–5.11)
WBC: 11.6 10*3/uL — ABNORMAL HIGH (ref 4.0–10.5)

## 2010-06-04 LAB — PROTIME-INR: INR: 1.01 (ref 0.00–1.49)

## 2010-06-04 LAB — GLUCOSE, CAPILLARY
Glucose-Capillary: 233 mg/dL — ABNORMAL HIGH (ref 70–99)
Glucose-Capillary: 257 mg/dL — ABNORMAL HIGH (ref 70–99)

## 2010-06-04 LAB — APTT: aPTT: 34 seconds (ref 24–37)

## 2010-06-04 LAB — URINALYSIS, ROUTINE W REFLEX MICROSCOPIC
Nitrite: NEGATIVE
Protein, ur: NEGATIVE mg/dL
Specific Gravity, Urine: 1.006 (ref 1.005–1.030)
Urobilinogen, UA: 0.2 mg/dL (ref 0.0–1.0)

## 2010-06-22 LAB — CBC
HCT: 35.3 % — ABNORMAL LOW (ref 36.0–46.0)
MCHC: 34.1 g/dL (ref 30.0–36.0)
MCV: 92.9 fL (ref 78.0–100.0)
MCV: 93.4 fL (ref 78.0–100.0)
Platelets: 313 10*3/uL (ref 150–400)
RBC: 3.8 MIL/uL — ABNORMAL LOW (ref 3.87–5.11)
RBC: 3.93 MIL/uL (ref 3.87–5.11)
RDW: 12.9 % (ref 11.5–15.5)
RDW: 13.1 % (ref 11.5–15.5)
WBC: 10.2 10*3/uL (ref 4.0–10.5)
WBC: 10.5 10*3/uL (ref 4.0–10.5)

## 2010-06-22 LAB — CARDIAC PANEL(CRET KIN+CKTOT+MB+TROPI)
CK, MB: 0.8 ng/mL (ref 0.3–4.0)
CK, MB: 0.9 ng/mL (ref 0.3–4.0)
Relative Index: INVALID (ref 0.0–2.5)
Total CK: 42 U/L (ref 7–177)
Troponin I: 0.03 ng/mL (ref 0.00–0.06)
Troponin I: 0.03 ng/mL (ref 0.00–0.06)

## 2010-06-22 LAB — BASIC METABOLIC PANEL
BUN: 3 mg/dL — ABNORMAL LOW (ref 6–23)
BUN: 6 mg/dL (ref 6–23)
Calcium: 8.7 mg/dL (ref 8.4–10.5)
Calcium: 9.4 mg/dL (ref 8.4–10.5)
Chloride: 103 mEq/L (ref 96–112)
Creatinine, Ser: 0.57 mg/dL (ref 0.4–1.2)
Creatinine, Ser: 0.59 mg/dL (ref 0.4–1.2)
GFR calc non Af Amer: >60 mL/min (ref >60)
Glucose, Bld: 143 mg/dL — ABNORMAL HIGH (ref 70–99)
Glucose, Bld: 286 mg/dL — ABNORMAL HIGH (ref 70–99)
Potassium: 3.8 mEq/L (ref 3.5–5.1)
Potassium: 4.3 mEq/L (ref 3.5–5.1)

## 2010-06-22 LAB — COMPREHENSIVE METABOLIC PANEL
AST: 24 U/L (ref 0–37)
Albumin: 3.5 g/dL (ref 3.5–5.2)
Alkaline Phosphatase: 68 U/L (ref 39–117)
CO2: 27 mEq/L (ref 19–32)
Chloride: 97 mEq/L (ref 96–112)
Creatinine, Ser: 0.64 mg/dL (ref 0.4–1.2)
GFR calc Af Amer: >60 mL/min (ref >60)
GFR calc non Af Amer: >60 mL/min (ref >60)
Potassium: 3.9 mEq/L (ref 3.5–5.1)
Total Bilirubin: 0.5 mg/dL (ref 0.3–1.2)

## 2010-06-22 LAB — LIPID PANEL
Cholesterol: 203 mg/dL — ABNORMAL HIGH (ref 0–200)
HDL: 62 mg/dL (ref >39)
LDL Cholesterol: 106 mg/dL — ABNORMAL HIGH (ref 0–99)
Total CHOL/HDL Ratio: 3.3 RATIO

## 2010-06-22 LAB — GLUCOSE, CAPILLARY
Glucose-Capillary: 125 mg/dL — ABNORMAL HIGH (ref 70–99)
Glucose-Capillary: 127 mg/dL — ABNORMAL HIGH (ref 70–99)
Glucose-Capillary: 330 mg/dL — ABNORMAL HIGH (ref 70–99)
Glucose-Capillary: 398 mg/dL — ABNORMAL HIGH (ref 70–99)
Glucose-Capillary: 75 mg/dL (ref 70–99)
Glucose-Capillary: 96 mg/dL (ref 70–99)

## 2010-06-22 LAB — HEPARIN LEVEL (UNFRACTIONATED): Heparin Unfractionated: 0.78 IU/mL — ABNORMAL HIGH (ref 0.30–0.70)

## 2010-06-22 LAB — APTT: aPTT: >200 seconds (ref 24–37)

## 2010-06-28 ENCOUNTER — Emergency Department (HOSPITAL_COMMUNITY): Payer: Medicare Other

## 2010-06-28 ENCOUNTER — Inpatient Hospital Stay (HOSPITAL_COMMUNITY)
Admission: EM | Admit: 2010-06-28 | Discharge: 2010-07-04 | DRG: 871 | Disposition: A | Discharge status: Home Health | Payer: Medicare Other | Attending: Cardiovascular Disease | Admitting: Cardiovascular Disease

## 2010-06-28 DIAGNOSIS — F341 Dysthymic disorder: Secondary | ICD-10-CM | POA: Diagnosis present

## 2010-06-28 DIAGNOSIS — Z9861 Coronary angioplasty status: Secondary | ICD-10-CM

## 2010-06-28 DIAGNOSIS — I059 Rheumatic mitral valve disease, unspecified: Secondary | ICD-10-CM | POA: Diagnosis present

## 2010-06-28 DIAGNOSIS — I214 Non-ST elevation (NSTEMI) myocardial infarction: Secondary | ICD-10-CM | POA: Diagnosis present

## 2010-06-28 DIAGNOSIS — F17200 Nicotine dependence, unspecified, uncomplicated: Secondary | ICD-10-CM | POA: Diagnosis present

## 2010-06-28 DIAGNOSIS — E1142 Type 2 diabetes mellitus with diabetic polyneuropathy: Secondary | ICD-10-CM | POA: Diagnosis present

## 2010-06-28 DIAGNOSIS — I2510 Atherosclerotic heart disease of native coronary artery without angina pectoris: Secondary | ICD-10-CM | POA: Diagnosis present

## 2010-06-28 DIAGNOSIS — Z7982 Long term (current) use of aspirin: Secondary | ICD-10-CM

## 2010-06-28 DIAGNOSIS — J189 Pneumonia, unspecified organism: Secondary | ICD-10-CM | POA: Diagnosis present

## 2010-06-28 DIAGNOSIS — R040 Epistaxis: Secondary | ICD-10-CM | POA: Diagnosis not present

## 2010-06-28 DIAGNOSIS — D72829 Elevated white blood cell count, unspecified: Secondary | ICD-10-CM | POA: Diagnosis present

## 2010-06-28 DIAGNOSIS — I10 Essential (primary) hypertension: Secondary | ICD-10-CM | POA: Diagnosis present

## 2010-06-28 DIAGNOSIS — Z794 Long term (current) use of insulin: Secondary | ICD-10-CM

## 2010-06-28 DIAGNOSIS — E1149 Type 2 diabetes mellitus with other diabetic neurological complication: Secondary | ICD-10-CM | POA: Diagnosis present

## 2010-06-28 DIAGNOSIS — E785 Hyperlipidemia, unspecified: Secondary | ICD-10-CM | POA: Diagnosis present

## 2010-06-28 DIAGNOSIS — Z951 Presence of aortocoronary bypass graft: Secondary | ICD-10-CM

## 2010-06-28 DIAGNOSIS — H9190 Unspecified hearing loss, unspecified ear: Secondary | ICD-10-CM | POA: Diagnosis present

## 2010-06-28 DIAGNOSIS — H409 Unspecified glaucoma: Secondary | ICD-10-CM | POA: Diagnosis present

## 2010-06-28 DIAGNOSIS — A419 Sepsis, unspecified organism: Principal | ICD-10-CM | POA: Diagnosis present

## 2010-06-28 DIAGNOSIS — Z7902 Long term (current) use of antithrombotics/antiplatelets: Secondary | ICD-10-CM

## 2010-06-28 DIAGNOSIS — Z79899 Other long term (current) drug therapy: Secondary | ICD-10-CM

## 2010-06-28 DIAGNOSIS — I5031 Acute diastolic (congestive) heart failure: Secondary | ICD-10-CM | POA: Diagnosis present

## 2010-06-28 LAB — BASIC METABOLIC PANEL
BUN: 16 mg/dL (ref 6–23)
Calcium: 8.5 mg/dL (ref 8.4–10.5)
GFR calc non Af Amer: 57 mL/min — ABNORMAL LOW (ref >60)
Glucose, Bld: 507 mg/dL — ABNORMAL HIGH (ref 70–99)
Potassium: 4.4 mEq/L (ref 3.5–5.1)

## 2010-06-28 LAB — COMPREHENSIVE METABOLIC PANEL
BUN: 19 mg/dL (ref 6–23)
CO2: 25 mEq/L (ref 19–32)
Calcium: 8.8 mg/dL (ref 8.4–10.5)
Creatinine, Ser: 1.05 mg/dL (ref 0.4–1.2)
GFR calc Af Amer: >60 mL/min (ref >60)
GFR calc non Af Amer: 54 mL/min — ABNORMAL LOW (ref >60)
Glucose, Bld: 324 mg/dL — ABNORMAL HIGH (ref 70–99)
Total Bilirubin: 0.7 mg/dL (ref 0.3–1.2)

## 2010-06-28 LAB — POCT CARDIAC MARKERS
CKMB, poc: 2.1 ng/mL (ref 1.0–8.0)
Myoglobin, poc: 352 ng/mL (ref 12–200)
Troponin i, poc: 0.29 ng/mL (ref 0.00–0.09)

## 2010-06-28 LAB — URINALYSIS, ROUTINE W REFLEX MICROSCOPIC
Ketones, ur: NEGATIVE mg/dL
Leukocytes, UA: NEGATIVE
Nitrite: NEGATIVE
Protein, ur: NEGATIVE mg/dL
Urobilinogen, UA: 0.2 mg/dL (ref 0.0–1.0)
pH: 5.5 (ref 5.0–8.0)

## 2010-06-28 LAB — PROTIME-INR: INR: 1.08 (ref 0.00–1.49)

## 2010-06-28 LAB — HEPARIN LEVEL (UNFRACTIONATED): Heparin Unfractionated: >2 IU/mL — ABNORMAL HIGH (ref 0.30–0.70)

## 2010-06-28 LAB — MRSA PCR SCREENING: MRSA by PCR: NEGATIVE

## 2010-06-28 LAB — GLUCOSE, CAPILLARY
Glucose-Capillary: 389 mg/dL — ABNORMAL HIGH (ref 70–99)
Glucose-Capillary: 285 mg/dL — ABNORMAL HIGH (ref 70–99)
Glucose-Capillary: 193 mg/dL — ABNORMAL HIGH (ref 70–99)
Glucose-Capillary: 182 mg/dL — ABNORMAL HIGH (ref 70–99)

## 2010-06-28 LAB — MAGNESIUM: Magnesium: 1.9 mg/dL (ref 1.5–2.5)

## 2010-06-28 LAB — POCT I-STAT, CHEM 8
Chloride: 100 mEq/L (ref 96–112)
HCT: 32 % — ABNORMAL LOW (ref 36.0–46.0)
Hemoglobin: 10.9 g/dL — ABNORMAL LOW (ref 12.0–15.0)
Potassium: 4.5 mEq/L (ref 3.5–5.1)
Sodium: 131 mEq/L — ABNORMAL LOW (ref 135–145)

## 2010-06-28 LAB — DIFFERENTIAL
Basophils Absolute: 0.1 10*3/uL (ref 0.0–0.1)
Eosinophils Relative: 0 % (ref 0–5)
Lymphocytes Relative: 8 % — ABNORMAL LOW (ref 12–46)
Lymphs Abs: 1.6 10*3/uL (ref 0.7–4.0)
Neutrophils Relative %: 87 % — ABNORMAL HIGH (ref 43–77)

## 2010-06-28 LAB — CBC
HCT: 29.8 % — ABNORMAL LOW (ref 36.0–46.0)
MCV: 85.1 fL (ref 78.0–100.0)
Platelets: 326 10*3/uL (ref 150–400)
RBC: 3.5 MIL/uL — ABNORMAL LOW (ref 3.87–5.11)
RDW: 14 % (ref 11.5–15.5)
WBC: 20.8 10*3/uL — ABNORMAL HIGH (ref 4.0–10.5)

## 2010-06-28 LAB — BRAIN NATRIURETIC PEPTIDE: Pro B Natriuretic peptide (BNP): 240 pg/mL — ABNORMAL HIGH (ref 0.0–100.0)

## 2010-06-28 LAB — CK TOTAL AND CKMB (NOT AT ARMC): CK, MB: 2.7 ng/mL (ref 0.3–4.0)

## 2010-06-28 LAB — CARDIAC PANEL(CRET KIN+CKTOT+MB+TROPI): Troponin I: 6.42 ng/mL (ref 0.00–0.06)

## 2010-06-29 ENCOUNTER — Inpatient Hospital Stay (HOSPITAL_COMMUNITY): Payer: Medicare Other

## 2010-06-29 LAB — CBC
Hemoglobin: 9.3 g/dL — ABNORMAL LOW (ref 12.0–15.0)
MCH: 29.3 pg (ref 26.0–34.0)
MCV: 84.2 fL (ref 78.0–100.0)
RBC: 3.17 MIL/uL — ABNORMAL LOW (ref 3.87–5.11)
WBC: 18.4 10*3/uL — ABNORMAL HIGH (ref 4.0–10.5)

## 2010-06-29 LAB — BASIC METABOLIC PANEL
BUN: 15 mg/dL (ref 6–23)
CO2: 24 mEq/L (ref 19–32)
Chloride: 99 mEq/L (ref 96–112)
Creatinine, Ser: 0.79 mg/dL (ref 0.4–1.2)
GFR calc Af Amer: >60 mL/min (ref >60)
Potassium: 3.5 mEq/L (ref 3.5–5.1)

## 2010-06-29 LAB — GLUCOSE, CAPILLARY
Glucose-Capillary: 271 mg/dL — ABNORMAL HIGH (ref 70–99)
Glucose-Capillary: 141 mg/dL — ABNORMAL HIGH (ref 70–99)
Glucose-Capillary: 253 mg/dL — ABNORMAL HIGH (ref 70–99)
Glucose-Capillary: 172 mg/dL — ABNORMAL HIGH (ref 70–99)

## 2010-06-29 LAB — URINE CULTURE
Culture  Setup Time: 201204161242
Culture: NO GROWTH

## 2010-06-29 LAB — CARDIAC PANEL(CRET KIN+CKTOT+MB+TROPI)
Relative Index: 3.2 — ABNORMAL HIGH (ref 0.0–2.5)
Total CK: 265 U/L — ABNORMAL HIGH (ref 7–177)
Troponin I: 4.5 ng/mL (ref 0.00–0.06)

## 2010-06-29 LAB — HEPARIN LEVEL (UNFRACTIONATED): Heparin Unfractionated: 0.14 IU/mL — ABNORMAL LOW (ref 0.30–0.70)

## 2010-06-30 ENCOUNTER — Inpatient Hospital Stay (HOSPITAL_COMMUNITY): Payer: Medicare Other

## 2010-06-30 LAB — GLUCOSE, CAPILLARY
Glucose-Capillary: 139 mg/dL — ABNORMAL HIGH (ref 70–99)
Glucose-Capillary: 162 mg/dL — ABNORMAL HIGH (ref 70–99)
Glucose-Capillary: 202 mg/dL — ABNORMAL HIGH (ref 70–99)
Glucose-Capillary: 346 mg/dL — ABNORMAL HIGH (ref 70–99)

## 2010-06-30 LAB — BASIC METABOLIC PANEL
CO2: 25 mEq/L (ref 19–32)
Chloride: 101 mEq/L (ref 96–112)
GFR calc Af Amer: >60 mL/min (ref >60)
Potassium: 4.1 mEq/L (ref 3.5–5.1)
Sodium: 135 mEq/L (ref 135–145)

## 2010-06-30 LAB — HEPARIN LEVEL (UNFRACTIONATED): Heparin Unfractionated: 0.44 IU/mL (ref 0.30–0.70)

## 2010-06-30 LAB — CBC
HCT: 26.1 % — ABNORMAL LOW (ref 36.0–46.0)
Hemoglobin: 9.2 g/dL — ABNORMAL LOW (ref 12.0–15.0)
MCHC: 35.2 g/dL (ref 30.0–36.0)
RBC: 3.09 MIL/uL — ABNORMAL LOW (ref 3.87–5.11)

## 2010-07-01 LAB — CBC
HCT: 25.9 % — ABNORMAL LOW (ref 36.0–46.0)
Hemoglobin: 8.9 g/dL — ABNORMAL LOW (ref 12.0–15.0)
MCV: 84.9 fL (ref 78.0–100.0)
RBC: 3.05 MIL/uL — ABNORMAL LOW (ref 3.87–5.11)
RDW: 13.8 % (ref 11.5–15.5)
WBC: 10.7 10*3/uL — ABNORMAL HIGH (ref 4.0–10.5)

## 2010-07-01 LAB — GLUCOSE, CAPILLARY
Glucose-Capillary: 346 mg/dL — ABNORMAL HIGH (ref 70–99)
Glucose-Capillary: 125 mg/dL — ABNORMAL HIGH (ref 70–99)
Glucose-Capillary: 239 mg/dL — ABNORMAL HIGH (ref 70–99)

## 2010-07-02 ENCOUNTER — Inpatient Hospital Stay (HOSPITAL_COMMUNITY): Payer: Medicare Other

## 2010-07-02 LAB — CBC
HCT: 24.7 % — ABNORMAL LOW (ref 36.0–46.0)
MCH: 29.5 pg (ref 26.0–34.0)
MCHC: 34.8 g/dL (ref 30.0–36.0)
RDW: 13.7 % (ref 11.5–15.5)

## 2010-07-02 LAB — GLUCOSE, CAPILLARY
Glucose-Capillary: 177 mg/dL — ABNORMAL HIGH (ref 70–99)
Glucose-Capillary: 271 mg/dL — ABNORMAL HIGH (ref 70–99)
Glucose-Capillary: 180 mg/dL — ABNORMAL HIGH (ref 70–99)
Glucose-Capillary: 99 mg/dL (ref 70–99)
Glucose-Capillary: 195 mg/dL — ABNORMAL HIGH (ref 70–99)

## 2010-07-02 LAB — BASIC METABOLIC PANEL
BUN: 15 mg/dL (ref 6–23)
CO2: 27 mEq/L (ref 19–32)
Calcium: 8.8 mg/dL (ref 8.4–10.5)
Creatinine, Ser: 0.79 mg/dL (ref 0.4–1.2)
GFR calc Af Amer: >60 mL/min (ref >60)
Glucose, Bld: 232 mg/dL — ABNORMAL HIGH (ref 70–99)

## 2010-07-03 ENCOUNTER — Inpatient Hospital Stay (HOSPITAL_COMMUNITY): Payer: Medicare Other

## 2010-07-03 LAB — IRON AND TIBC
Iron: 42 ug/dL (ref 42–135)
Saturation Ratios: 17 % — ABNORMAL LOW (ref 20–55)
TIBC: 251 ug/dL (ref 250–470)
UIBC: 209 ug/dL

## 2010-07-03 LAB — BASIC METABOLIC PANEL
BUN: 12 mg/dL (ref 6–23)
Calcium: 8.9 mg/dL (ref 8.4–10.5)
Creatinine, Ser: 0.71 mg/dL (ref 0.4–1.2)
GFR calc Af Amer: >60 mL/min (ref >60)

## 2010-07-03 LAB — CBC
MCH: 29.7 pg (ref 26.0–34.0)
MCHC: 35.5 g/dL (ref 30.0–36.0)
Platelets: 431 10*3/uL — ABNORMAL HIGH (ref 150–400)
RDW: 13.5 % (ref 11.5–15.5)

## 2010-07-03 LAB — HEPARIN LEVEL (UNFRACTIONATED): Heparin Unfractionated: 0.4 IU/mL (ref 0.30–0.70)

## 2010-07-03 LAB — FERRITIN: Ferritin: 251 ng/mL (ref 10–291)

## 2010-07-03 LAB — GLUCOSE, CAPILLARY
Glucose-Capillary: 161 mg/dL — ABNORMAL HIGH (ref 70–99)
Glucose-Capillary: 138 mg/dL — ABNORMAL HIGH (ref 70–99)

## 2010-07-03 MED ORDER — IOHEXOL 300 MG/ML  SOLN: 100.0000 mL | Freq: Once | INTRAMUSCULAR | Status: AC | PRN

## 2010-07-04 LAB — CROSSMATCH: Unit division: 0

## 2010-07-04 LAB — CBC
HCT: 26.8 % — ABNORMAL LOW (ref 36.0–46.0)
MCH: 30 pg (ref 26.0–34.0)
MCHC: 35.4 g/dL (ref 30.0–36.0)
MCV: 84.5 fL (ref 78.0–100.0)
Platelets: 484 10*3/uL — ABNORMAL HIGH (ref 150–400)
RDW: 13.6 % (ref 11.5–15.5)

## 2010-07-04 LAB — CULTURE, BLOOD (ROUTINE X 2)
Culture  Setup Time: 201204161617
Culture: NO GROWTH

## 2010-07-06 NOTE — H&P (Signed)
NAMEADRIANNA, [name]                   ACCOUNT NO.:  [id number]  MEDICAL RECORD NO.:  [id number]           PATIENT TYPE:  E  LOCATION:  [facility]                         FACILITY:  [facility]  PHYSICIAN:  Thurmon Fair, MD     DATE OF BIRTH:  [birth year]  DATE OF ADMISSION:  06/28/2010 DATE OF DISCHARGE:                             HISTORY & PHYSICAL   CHIEF COMPLAINT:  Weakness and chest pain.  HISTORY OF PRESENT ILLNESS:  [name] is a 60 year old female who has been followed by Dr. Alanda Amass and Dr. Adrian Prince.  She has a long history of coronary artery disease.  She had bypass grafting in 1995. She has had several catheterizations since.  Her last intervention was a stent to the vein graft to the RCA in January 2009.  She was just restudied in March 2012.  At that time, the LIMA to the LAD was patent, the SVG to the first diagonal was patent, the SVG to the OM was patent, and the SVG to the RCA was patent with a patent stent.  Plan is for medical therapy for microvascular angina.  Symptoms have always been hard to sort out, she has chronic anxiety and depression and chronic chest pain.  She did have good LV function by echocardiogram in March 2012.  Since discharge, she has had some intermittent weakness, but not a lot of angina.  Over the last 48 hours, she has become more weak. Over the weekend, she had some chills.  This morning, she called out to her roommate that she needed help.  When her roommate got there, she was short of breath and having chest pain and looking pale.  She was given aspirin and then nitroglycerin and then more nitroglycerin and then became diaphoretic.  EMS was called.  When they arrived, she was barely responsive.  There was an initial attempt at an airway placement, but then she regained consciousness enough to avoid that.  She is now in the emergency room.  She is mildly hypotensive, but fairly comfortable at the moment, although she is short of breath with  any activity.  Her initial chest x-ray shows heart failure.  She is febrile with a temperature of 100 and a white count of 20,000.  She is admitted now for further evaluation.  PAST MEDICAL HISTORY:  Remarkable for: 1. 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 2. She has a history of anxiety and depression as noted above. 3. She has hypertension and was hypertensive in the hospital when she     had her catheterization in March 2012. 4. She has diastolic dysfunction by echo. 5. She has dyslipidemia. 6. She has treated glaucoma. 7. She had left shoulder surgery in March 2011, and tolerated this     well. 8. 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 Ezzard Standing for this.  It is not clear with the etiology     was, it apparently was possibly viral.  She did take a course of     antiviral medications as well as steroids, she has not had steroids     for a month.  Her current medications as of discharge are: 1. Aspirin 325 a day. 2. Lumigan ophthalmic drops. 3. Timolol ophthalmic drops. 4. Protonix 40 b.i.d. 5. Zocor 40 mg nightly. 6. Elavil 100 mg nightly. 7. Coreg 25 mg b.i.d. 8. Diovan 40 mg a day. 9. Levemir insulin 26 units in the morning, 10 units in the evening. 10.Imdur 60 mg a day. 11.Lasix 20 mg a day. 12.Plavix. 13.Zoloft.  She is intolerant to [facility] and other STATINS.  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  She lives with her significant other, Mardy.  She has no children.  She has been a smoker, although she smokes less than a half pack a day.  FAMILY HISTORY:  Unremarkable.  REVIEW OF SYSTEMS:  Essentially unremarkable except for noted above, she denies any dysuria.  PHYSICAL EXAMINATION:  VITAL SIGNS:  Blood pressure 87/56, pulse 96, temperature 100. GENERAL:  She is a pale, ill-appearing female in no acute  distress. HEENT:  Normocephalic.  She has some perioral dry blood from a traumatic intubation attempt. NECK:  Without JVD. CHEST:  Crackles halfway up bilaterally. CARDIAC:  Regular rate and rhythm without obvious murmur or rub. ABDOMEN:  Not distended, nontender.  There is no obvious hepatosplenomegaly. EXTREMITIES:  Trace edema bilaterally. NEUROLOGIC:  Grossly intact.  She is awake and alert and cooperative. SKIN:  Cool and dry.  EKG showed sinus rhythm with T-wave inversion in lead III and aVF as well as V4, V5, and V6 which apparently is new from her previous EKG. Sodium is 129, potassium 4.4, BUN 16, creatinine 1.0.  White count 20,000.8, hemoglobin 10.4, hematocrit 29.8, platelets 326, troponin 0.29.  Chest x-ray shows pulmonary edema.  IMPRESSION: 1. Unstable angina, possible non-ST elevation myocardial infarction. 2. Fever and leukocytosis, rule out sepsis. 3. Known coronary artery disease with coronary artery bypass grafting     in 1995, last intervention was a stent to the vein graft of the     right coronary artery in January 2009, this was patent as well as     the other grafts at catheterization in March 2012, with plans for     medical therapy. 4. Hypertension and diastolic dysfunction, the patient is now     hypotensive. 5. Insulin-dependent diabetes with neuropathy. 6. Preserved left ventricular function by echocardiogram in March     2012, with diastolic dysfunction. 7. Anxiety and depression. 8. Treated dyslipidemia with a history of intolerance to other     statins. 9. History of glaucoma.  PLAN:  The patient is seen by Dr. Royann Shivers and myself in the emergency room.  She will be admitted to the intensive care unit for further evaluation.     Abelino Derrick, P.A.   ______________________________ Thurmon Fair, MD    LKK/MEDQ  D:  06/28/2010  T:  06/28/2010  Job:  [id number]  cc:   Gerlene Burdock A. Alanda Amass, M.D. Tera Mater. Evlyn Kanner, M.D.  Electronically  Signed by Corine Shelter P.A. on 06/28/2010 04:12:41 PM Electronically Signed by Thurmon Fair M.D. on 07/06/2010 01:48:26 PM

## 2010-07-27 NOTE — Discharge Summary (Signed)
[name], [name]                   ACCOUNT NO.:  [id number]   MEDICAL RECORD NO.:  [id number]          PATIENT TYPE:  INP   LOCATION:  6529                         FACILITY:  [facility]   PHYSICIAN:  Richard A. Alanda Amass, M.D.DATE OF BIRTH:  1951/02/05   DATE OF ADMISSION:  04/10/2007  DATE OF DISCHARGE:  04/11/2007                               DISCHARGE SUMMARY   DISCHARGE DIAGNOSES:  1. Crescendo angina.  2. Coronary artery disease  with history of bypass grafting now with      instant restenosis of the saphenous vein graft to the RCA      undergoing PTCA and stenting balloon angioplasty for other stenosis      and the saphenous vein graft to the RCA.  3. On maximal medical therapy as much as tolerable.      a.     Unable to titrate beta blockers any higher due to extreme       fatigue.      b.     Increased LFTs.  4. He has had ECT but insurance has not approved further __________      appointments.  5. Diabetes mellitus, currently controlled secondary to next number.  6. For recent pruritus is on steroids and has had elevated      glycohemoglobin secondary to steroids.  7. Statin intolerance.  We will __________  8. Hyperlipidemia.  9. __________   PROCEDURES:  __________   By Dr. Cristy Hilts. Jacinto Halim, MD.  Normal LV function EF 60 percent on __________   HISTORY OF PRESENT ILLNESS:  A 60 year old single white female lives  with her significant other in __________  with a quite complex coronary  artery disease with remote interventions, remote bypass grafting, and  now with new crescendo angina.  Pleas see Dr. Kandis Cocking chart H and P  for complete cardiac interventions.   Over the last several weeks, [name] has increased frequency and severity  of her anginal attacks.  They were coming very minimal __________  worse  in cold weather.  Described as substernal chest pressure with some  radiation to neck with associated with shortness of breath and weakness.  Instantly relieved with  1 to 3 nitroglycerin and she will take an extra  1 to 3 baby aspirin along with that and also had to stop her regular  exercise secondary to the angina.  She managed this at home as long as  she could. We did attempt to increase her Imdur from 60 in the morning  and 30 in the evening to 60 b.i.d. to see if that would help but she  continued with increasing pain.  Dr. Alanda Amass saw her in the office  __________  and arranged for cardiac catheterization __________   PAST MEDICAL HISTORY:  __________  she does have a history of colitis,  recently on steroids which elevated glycohemoglobin. She had negative  biopsies on colonoscopy in August 2008.  She does continue to smoke,  only  3 or 4 cigarettes but she knows __________  Intolerance to  statins.  Dr. Alanda Amass has discussed that  with her on several times but  he will again re-attempt __________   For the rest of family history, social history, review of systems see  dictated H and P.   DISCHARGE MEDICATIONS:  1. Plavix 75 mg 1 daily.  2. Coreg 25 mg 1 twice a day.  3. Aspirin  325 mg daily until she is seen by Dr. Alanda Amass.  4. Nexium 40 mg daily.  5. Imdur 60 mg once in the morning and 30 in the evening.  6. Xanax 0.5 mg p.r.n.  7. Amitriptyline 100 mg at bedtime.  8. Zoloft 50 mg daily.  9. Diovan increased to 80 mg daily.  10.Levemir insulin subcutaneous daily as before 25 units.  11.Novolin R AC 5 units 3 times a day with meals.  12.Lumigan eye drops 1 drop both eyes at bedtime.  13.Pravachol 20 mg daily.  14.She uses nitroglycerin sublingually as needed at home though it is      not on her discharge instructions she does have that.   ALLERGIES:  1. CODEINE.  2. BIAXIN.  3. ALTACE.  4. LIPITOR.  5. __________  6. FLAGYL.  NOT SO MUCH ALLERGY TO STATINS BUT INTOLERANCE.   DISCHARGE INSTRUCTIONS:  1. Follow up with Dr.  Alanda Amass April 24, 2007 at 59:15 a.m.  2. Follow up with Dr.  Evlyn Kanner.  3. Low-sodium heart  healthy diabetic diet.  4. Increase activity slowly.  5. May shower and bathe but no lifting for 2 days.  6. No driving for 2 days.   HOSPITAL COURSE:  Ms. Bonenfant was brought in for cardiac catheterization  and intervention as needed which she underwent PCI and saphenous vein  graft to the RCA __________  did well.  Her troponin did peak at 0.13.  That was felt to be just an __________  procedure and insignificant.  She had actually felt very good at discharge.  No chest pain or if she  did it was nothing compared to what she has had and what her chronic  pain is.  She was stable and ready for discharge to home.   OTHER LABORATORY VALUES:  Hemoglobin 11.1, hematocrit 32.5, WBC 9.8,  platelets 293, MCV 91.  Chemistry:  Sodium 138, potassium 3.5, she did  get 20 of potassium before discharge.  Chloride 103, CO2 of 30, BUN 6,  creatinine 0.60, glucose 123. Troponin initially post procedure 0.11 up  to 0.13 but CKs were all negative 51 and 66 and __________  and 2.7.  Calcium 8.9, glycohemoglobin was elevated at 9.1 which we discussed.   VITAL SIGNS AT DISCHARGE:  Blood pressure 121/54.  Pulse 67.  Respirations  16. Temperature 97.7.  SAO2 at 98 percent on room air.   Accu-Cheks ranged from 115 to 314 here in the hospital.   Chest x-ray, no acute cardiopulmonary  disease.   HOSPITAL COURSE:  As stated.  She was seen and discharged home by Dr.  Elsie Lincoln. She will follow up as an outpatient.   I did go ahead and decrease her Imdur back to pre crescendo angina dose  so that if she did develop further problems, we would have medication  therapy for her.  Hopefully she can tolerate the Pravachol as well.      Darcella Gasman. [place], N.P.      Richard A. Alanda Amass, M.D.  Electronically Signed    LRI/MEDQ  D:  04/11/2007  T:  04/12/2007  Job:  [id number]   cc:   Gerlene Burdock A. Alanda Amass, M.D.  Tera Mater. Evlyn Kanner, M.D.  Valetta Fuller, MD  Oneal Deputy. Juanetta Gosling, M.D.

## 2010-07-27 NOTE — Assessment & Plan Note (Signed)
[name]                         GASTROENTEROLOGY OFFICE NOTE   TORRI, MICHALSKI                          MRN:          [id number]  DATE:07/19/2006                            DOB:          [birth year]    PRIMARY CARE PHYSICIAN:  Dr. Kari Baars.   GASTROINTESTINAL PROBLEM LIST:  Intermittent diarrhea began Fall 2006.  Stool cultures negative including C. difficile x2, TSH normal, Giardia  and Cryptosporidium negative, no white cells.  Colonoscopy, September  2006, showed normal-appearing colon, biopsies questionable for  lymphocytic colitis.  The patient had responded to empiric vancomycin,  as there was a suspicion of C. difficile prior to the colonoscopy.  The  patient recurred with voluminous watery diarrhea after stopping  vancomycin, but she quickly responded to repeat course of vancomycin,  tried Entocort and after one pill, her blood sugars went up into the  600s, so she stopped.   INTERVAL HISTORY:  I last saw [name] over a year ago.  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  She also did not respond to Xifaxan and her Cipro that could may have  helped if she had bacterial overgrowth.  I tried her empirically on  tincture of opium and she said it made her too euphoric and she stopped.  She was going to see me again 2 or 3 weeks after that last appointment,  but she did not return for visit until now.  She has been apparently  doing very well for many months.  She thinks Flora-Q was the treatment  that finally helped her.  However, for the past 4 weeks, she has had a  return of watery, voluminous, non-bloody diarrhea.  She is feeling  fatigued.  She has tried the [employer] again and that is not helping.  She  is on Imodium and Lomotil and those are not helping.  She had labs  tested just prior to this visit showing a normal  CBC, a blood glucose of  202, otherwise normal basic metabolic profile and liver tests, normal  thyroid testing.  Stool studies were also done and those are pending.   CURRENT MEDICINES:  Zoloft, Plavix, aspirin, Imdur, Xanax, Darvocet,  Diovan, Coreg, Nexium, amitriptyline.   PHYSICAL EXAM:  Weight 142 pounds.  Blood pressure 102/60, pulse 84.  CONSTITUTIONAL:  Chronically ill-appearing.  LUNGS:  Clear to auscultation bilaterally.  HEART:  Regular rate and rhythm.  ABDOMEN:  Soft, nontender and non-distended.  Normal bowel sounds.   ASSESSMENT AND PLAN:  [age]-year-old woman with chronic  intermittent watery diarrhea.   She has had no recent antibiotics.  Looking back at her biopsies done in  2006, pathologists were fairly specific that she may have microscopic  colitis.  I tried her on Entocort at one point, but she said her blood  sugars shot up very quickly after a single pill; she had been reluctant  to restart that.  Now, however, I think we should try it again with her  just simply monitoring her blood sugars very closely over the next  several days.  She will therefore start Entocort 9 mg a day.  She will  return to see me in 1 week.  Obviously, if stool cultures show anything,  we will proceed accordingly.  I did not mention above, but she has also  had rare intermittent solid food dysphagia and she should be evaluated  by esophagogastroduodenoscopy at some point.  I would like to get her  diarrhea under control before proceeding with that.     Erika Fee, MD  Electronically Signed    DPJ/MedQ  DD: 07/19/2006  DT: 07/19/2006  Job #: [id number]   cc:   Ramon Dredge L. Juanetta Gosling, M.D.  Tera Mater. Evlyn Kanner, M.D.

## 2010-07-27 NOTE — Assessment & Plan Note (Signed)
[facility]                         GASTROENTEROLOGY OFFICE NOTE   SKILAR, MARCOU                          MRN:          [id number]  DATE:02/13/2007                            DOB:          Feb 07, 1951    PRIMARY CARE PHYSICIAN:  Dr. Kari Baars.   ENDOCRINOLOGIST:  Dr. Adrian Prince.   CARDIOLOGIST:  Susa Griffins.   GI PROBLEM LIST:  1. Intermittent diarrhea began Fall 2006.  Stool cultures negative      including C. difficile x2, TSH normal, Giardia and Cryptosporidium      negative, no white cells.  Colonoscopy, September 2006, showed      normal-appearing colon, biopsies questionable for lymphocytic      colitis.  The patient had responded to empiric vancomycin, as there      was a suspicion of C. difficile prior to the colonoscopy.  The      patient recurred with voluminous watery diarrhea after stopping      vancomycin, but she quickly responded to repeat course of      vancomycin, tried Entocort and after one pill, her blood sugars      went up into the 600s, so she stopped.  Recurrence of dramatic      diarrhea late April 2008, responded very quickly to Entocort 9 mg      daily.  She was having intermittent abdominal pains, so CT scan      with IV normal contrast was arranged.  This showed some      questionable mild thickening of the distal ileal wall, subtle.      Colonoscopy, July 2008, was normal exam to the terminal ileum.      Random biopsies showed no mucosal abnormalities.   INTERVAL HISTORY:  I last saw Jessina at the time of her colonoscopy 4  months ago.  She has had some heart issues and has not been able to  followup since then.  She weaned herself off of the Entocort completely,  and has used it episodically when she has flares of diarrhea.  She has  had 2 to 3 flares each lasting 7 to 8 days.  She describes these as  perfuse, watery diarrhea that is extremely responsive to the Entocort.  She will usually give it a  couple of days before committing to Entocort.  She will take 3 pills for 2 to 3 days, 2 pills for 2 to 3 days, and then  1 pill for 2 to 3 days.  In doing this episodic regimen, she noticed  that her diarrhea gets under very good control.  She does, of course,  have issues with her blood sugars at that time.  She has had no  bleeding.  She actually feels very well from a cardiac perspective with  careful balancing of her medicines that Dr. Alanda Amass has accomplished.   CURRENT MEDICINES:  1. Zoloft.  2. Plavix.  3. Aspirin.  4. Imdur.  5. Xanax.  6. Darvocet.  7. Diovan.  8. Coreg.  9. Amitriptyline.  10.Levemir.  11.Novolin.  12.Nitroglycerin.  13.Entocort periodically.  14.Eye drops.  15.Lorazepam.   PHYSICAL EXAMINATION:  Weight 139 pounds, which is stable from 4 months  ago.  Blood pressure 110/60, pulse 86.  CONSTITUTIONAL:  Very well appearing.  She does appear chronically ill,  but in much spirits then when I have normally seen her.  NEUROLOGIC:  Alert and oriented x3.  ABDOMEN:  Soft, nontender, nondistended.  Normal bowel sounds.   ASSESSMENT AND PLAN:  A 60 year old woman with intermittent diarrhea,  serious coronary artery disease, and difficult-to-manage diabetes.   The biopsies I performed from normal colon mucosa in July of this year  were all normal.  That being said, she continues to be very responsive  to Entocort when she does have diarrhea episodes.  I recommended she  continue to do the periodic Entocort treatments, and I think 2 to 3 days  of 3 mg tapering down quickly, if it works, is a great solution to her  diarrhea problem.  She had some trouble getting an appointment with me  for unclear reasons, and I do not see any documentation of difficulties  with our front office, but I have given her my nurse's phone number for  her to have more direct access with squeeze-in appointments as she  needs.  She will, otherwise, return to see me in 2 months'  time.     Rachael Fee, MD  Electronically Signed    DPJ/MedQ  DD: 02/13/2007  DT: 02/13/2007  Job #: [id number]   cc:   Ramon Dredge L. Juanetta Gosling, M.D.  Tera Mater. Evlyn Kanner, M.D.  Richard A. Alanda Amass, M.D.

## 2010-07-27 NOTE — Assessment & Plan Note (Signed)
[facility]                         GASTROENTEROLOGY OFFICE NOTE   [name], [name]                          MRN:          [id number]  DATE:08/08/2006                            DOB:          23-Jul-1950    PRIMARY CARE PHYSICIAN:  Dr. Mikey Bussing.   ENDOCRINOLOGIST:  Dr. Adrian Prince.   CARDIOLOGIST:  Dr. Susa Griffins.   GI PROBLEM LIST:  Intermittent diarrhea began Fall 2006.  Stool cultures  negative including C. difficile x2, TSH normal, Giardia and  Cryptosporidium negative, no white cells.  Colonoscopy, September 2006,  showed normal-appearing colon, biopsies questionable for lymphocytic  colitis.  The patient had responded to empiric vancomycin, as there was  a suspicion of C. difficile prior to the colonoscopy.  The patient  recurred with voluminous watery diarrhea after stopping vancomycin, but  she quickly responded to repeat course of vancomycin, tried Entocort and  after one pill, her blood sugars went up into the 600s, so she stopped.  Recurrence of dramatic diarrhea late April 2008, responded very quickly  to Entocort 9 mg daily.   INTERVAL HISTORY:  I last saw [name] 2 weeks ago.  I began to taper her  off her Entocort to 6 mg a day, and she, within a week or so of this,  started having a recurrence of the watery diarrhea so we went back up to  9 mg a day.  After several days of this, she actually became  constipated.  She began having more and more abdominal discomfort.  She  backed herself down to 6 mg a day.  Over the same time, she is  increasingly uncomfortable in her right lower quadrant.  She has had no  fevers or chills.  She is having some recurrent chest discomforts and is  due to see her cardiologist shortly.  She is having difficulty  maintaining her blood sugars and starting the Entocort, and will be  seeing Dr. Evlyn Kanner soon as well.   CURRENT MEDICATIONS:  1. Zoloft.  2. Plavix.  3. Aspirin.  4. Imdur.  5.  Xanax.  6. Darvocet.  7. Diovan.  8. Coreg.  9. Amitriptyline.  10.Levemir.  11.Novolin.  12.Nitroglycerin.  13.Entocort.   PHYSICAL EXAMINATION:  Weight 138 pounds, blood pressure 122/54, pulse  92.  CONSTITUTIONAL:  Chronically ill appearing.  ABDOMEN:  Soft.  Mildly tender in the right lower quadrant.  Nondistended.  Normal bowel sounds.  EXTREMITIES:  No lower extremity edema.   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   Hopefully, her diarrhea will stay resolved on 6 mg a day.  If so, after  about a month, I will back her down to 3 mg a day.  This is a not much  absorbed steroid, but enough for her to give her quite a lot of trouble  with her already difficult to control diabetes.  She is working very  closely with her endocrinologist to keep her sugars under best control  as possible.  She will get a CT scan, IV  and oral contrast, to look into  this new right lower quadrant discomfort.     Rachael Fee, MD  Electronically Signed    DPJ/MedQ  DD: 08/08/2006  DT: 08/08/2006  Job #: [id number]   cc:   Ramon Dredge L. Juanetta Gosling, M.D.  Tera Mater. Evlyn Kanner, M.D.  Richard A. Alanda Amass, M.D.

## 2010-07-27 NOTE — H&P (Signed)
[name], [name]                   ACCOUNT NO.:  [id number]   MEDICAL RECORD NO.:  [id number]          PATIENT TYPE:  INP   LOCATION:  1827                         FACILITY:  [facility]   PHYSICIAN:  Richard A. Alanda Amass, M.D.DATE OF BIRTH:  Jun 13, 1950   DATE OF ADMISSION:  04/20/2007  DATE OF DISCHARGE:                              HISTORY & PHYSICAL   CHIEF COMPLAINT:  Chest pain, pressure 10/10 radiating to the left arm,  neck, choking sensation in the throat, severe shortness of breath and  nausea with clamminess.   HISTORY OF PRESENT ILLNESS:  This is a 60 year old Caucasian female, a  long-time patient of Dr. Kandis Cocking who was recently discharged from  [name] after she underwent stenting of the SVG to the RCA,  who presented back to the emergency room this morning.   The patient had a long history of coronary artery disease and is status  post CABG in 1995.  Recently, she was admitted to [name], and  after discharge has been doing great.  She says for the last three  years, she says that right after the stent she had been feeling the best  she has ever felt.  At the end of the last three mornings, she  experienced a set of chest pains that were relieved by nitroglycerin.  Usually, this pain occurs with exertion.  The patient at times takes few  nitroglycerin sublingual to relieve the pain.  Yesterday, she was on a  stationary bicycle due to her bursitis when she experienced onset of  chest discomfort, nausea, clamminess.  She took a few nitroglycerin,  went to bed, pain dissipated but later it kept coming back, and the  patient had to take extra nitroglycerin.   This morning, she woke up and ate her breakfast but then a little  exertion made her pain reoccur.  By this time, the pain was the worst  she had during those three days.  Along with that, she experienced a  jerking sensation in her throat.  She was severely short of breath,  nauseated, clammy.  The  pain radiated to the neck and left arm.  She  called EMS and was delivered to [name].  In route, the  patient was given morphine injection with some relief of her pain.   PAST MEDICAL HISTORY:  1. Coronary artery disease, and she status post CABG x4 in 1995 done      by Dr. Tyrone Sage, outlined in the last dictation, and to my best      understanding, they are as follows:  Lumen to the LAD, SVG to RCA      and PDA.  SVG __________ is marginal, SVG to diagonal 1.  She had      multiple interventions in the past, and he examined the  the SVG to      RCA.  The last one was done on January 27, and the patient received      bare metal stent.  2. History of insulin-dependent diabetes mellitus and diabetic      neuropathy.  3. Severe hyperlipidemia with statin in tolerance.  4. Anxiety/depression.  5. History significant for hypertension.   SOCIAL HISTORY:  She does not drink alcohol.  Tries to exercise as much  as she can.  Unfortunately continues to smoke.   ALLERGIES:  CODEINE, FLAGYL, BIAXIN, DEMEROL.   MEDICATIONS:  1. Coreg 25 mg b.i.d.  2. Zoloft 50 mg q.i.d.  3. Nexium 40 mg daily.  4. Plavix 75 mg daily.  5. Aspirin 325 mg daily.  6. Imdur 30 mg daily.  7. Amitriptyline 100 mg q.h.s.  8. Xanax 0.5 mg q.12-48 h p.r.n.  9. Lumigan 1 gtt OU.  10.Lantus 25 units p.m. subcu.  11.Humulin R 5 units q.a.c.  12.Sliding scale insulin is needed.  13.Diovan 80 mg q.24 h.   REVIEW OF SYSTEMS:  As in HPI.   PHYSICAL EXAMINATION:  VITAL SIGNS:  Temperature 97.6, blood pressure  initially 148/62, repeat measurement 122/74, heart rate 72, respirations  16, O2 saturation 100% on 2 liters.  HEENT:  Normocephalic, atraumatic.  Extraocular movements intact.  NECK:  Without any JVD or carotid bruits.  LUNGS:  Clear to auscultation bilaterally.  HEART:  Regular rate and rhythm.  There are no murmurs, gallops, rubs.  ABDOMEN:  Soft, nontender, nondistended with positive bowel  sounds x4.  EXTREMITIES:  No signs of edema.  NEUROLOGICAL: Alert, oriented x3.  No focal abnormalities.   STUDIES:  EKG shows sinus rhythm without any abnormalities.  No  significant changes compared to the previous tracing.  Labs are pending  at this time.   IMPRESSION:  Unstable angina pectoris.  Will admit and rule out MI  protocol to CCU.  The patient will be taken to cath lab to rule out  early stent re-stenosis.  Will start her on heparin, nitroglycerin,  continue all other medications except with Imdur on hold while the  patient is on IV nitroglycerin, and will recycle enzymes, all basic  labs.  Also, the recommendations per M.D.      Raymon Mutton, P.A.      Richard A. Alanda Amass, M.D.  Electronically Signed    MK/MEDQ  D:  04/20/2007  T:  04/20/2007  Job:  [id number]   cc:   [name]  Richard A. Alanda Amass, M.D.

## 2010-07-27 NOTE — Cardiovascular Report (Signed)
NAMESITARA, CASHWELL NO.:  [id number]   MEDICAL RECORD NO.:  [id number]          PATIENT TYPE:  INP   LOCATION:  2107                         FACILITY:  [facility]   PHYSICIAN:  Ritta Slot, MD     DATE OF BIRTH:  11-08-1950   DATE OF PROCEDURE:  DATE OF DISCHARGE:  04/21/2007                            CARDIAC CATHETERIZATION   DIAGNOSTIC CARDIAC CATHETERIZATION   PROCEDURES PERFORMED:  1. Retrograde coronary angiography by the Judkins technique.  2. Selective coronary angiography of the native coronary artery.  3. Selective visualization of multiple saphenous vein grafts.  4. Selective visualization of the left internal mammary artery to the      left anterior descending.   PATIENT PROFILE:  This is a 60 year old lady status post PCI to the  saphenous vein graft to the RCA last week by Dr. Yates Decamp.  She has a  past medical history of significant coronary artery disease with  coronary artery bypass grafting by Dr. Tyrone Sage x5 September, 1995.  She  has had multiple PCIs to both the native LAD and RCA in the past but  most recently underwent stenting by my colleague, Dr. Jacinto Halim, with a perm  stent to the SVG to the PDA.  She comes back in now complaining of  increasing angina on exertion; specifically, today it was not relieved  by nitroglycerin.  She reported to the emergency room where she was  continuing to be in pain.  She had no ECG changes.  However, it was  decided for definitive diagnosis to bring her straight to Cardiac  Catheterization Laboratory.  After informed consent the patient was  brought to the 2nd Floor of [facility] to the Cardiac  Catheterization Laboratory.  The patient's right groin was prepped and  draped in the usual sterile fashion.  For sedation, she was given 2 mg  of Versed, 25 mcg of fentanyl.  For local anesthesia, 15 mL of 1%  lidocaine was used through the RFA.   APPROACH:  RFA.   COMPLICATIONS:  None.   PROCEDURE  PERFORMED:  The right femoral artery was accessed using a Cook  needle.  Through the [facility] needle was passed a J-wire and over which was  placed a 6-French sheath.  The 6-French was placed into RFA.  Through  the 6-French sheath was passed the JL4 catheter.  This was inserted in  the left main coronary artery.  Visualization of the left coronary  artery was taken in the LAO cranial view and the RAO cranial view.  The  JL4 catheter was then exchanged out for the JR4 catheter.  The JR4  catheter was attempted to be inserted into the RCA.  This was  unsuccessful but selective visualization of the saphenous vein grafts to  the OM and the diagonal were obtained using RCA catheter.  The RCA  catheter was then exchanged over the exchange wire for the AR2 catheter.  The AR2 catheter was then successfully engaged into the saphenous vein  graft to the RCA.  Selective visualization of the vein graft to the  RCA  was taken in RAO cranial and LAO cranial views.  The AR2 catheter was  then used to engage into the native right coronary artery.  Visualization of the RCA was obtained in the RAO view.  The AR2 catheter  was then exchanged over the regular J-wire for the JR4 catheter.  The  JR4 catheter was then inserted into the left subclavian artery.  An  exchange wire was placed over the JR4 catheter, it was then exchanged  out over the exchange wire.  Over the exchange wire was then placed the  LIMA catheter.  This was inserted into the left internal mammary artery  without difficulty.  Selective visualization of the LIMA to the LAD was  taken in the AP view.   FINDINGS:  1. Pressures.  AO pressure was 140/62.  2. Coronaries.  The native right coronary artery has 100% occlusion      which was old.  The native anterior descending artery had 100      percent in-stent stenosis, thought this was old too.  There was      100% occlusion of the circumflex to its origin, this was old as      well.  Saphenous  vein graft to the diagonal was patent and free of      any disease.  Saphenous vein graft to the obtuse marginal-1 was      also patent and free of any disease, although the obtuse marginal      was diffusely diseased distally.  Saphenous vein graft to the right      coronary artery was patent and free of any disease.  In particular,      the stents that were placed last week were free of any disease and      had good TIMI-3 flow.  There is no evidence of significant      obstructive atherosclerosis that would be causing her angina.      Films were reviewed by Dr. Jacinto Halim.   FINAL DIAGNOSES:  1. Severe three vessel native coronary artery disease.  2. Status post coronary artery bypass grafting with patent saphenous      vein graft to the diagonal, patent saphenous vein graft to obtuse      marginal, patent saphenous vein graft to the right coronary artery,      and particularly patent stents in the saphenous vein graft to the      right coronary artery.  Patent left internal mammary artery to the      left anterior descending.   PLAN:  She will be admitted for overnight observation and then  discharged home the following day.      Ritta Slot, MD  Electronically Signed     HS/MEDQ  D:  04/20/2007  T:  04/22/2007  Job:  [id number]

## 2010-07-27 NOTE — Cardiovascular Report (Signed)
NAMEJADEYN, [name] NO.:  [id number]   MEDICAL RECORD NO.:  [id number]          PATIENT TYPE:  INP   LOCATION:  2506                         FACILITY:  [facility]   PHYSICIAN:  Cristy Hilts. Jacinto Halim, MD       DATE OF BIRTH:  February 23, 1951   DATE OF PROCEDURE:  07/18/2008  DATE OF DISCHARGE:                            CARDIAC CATHETERIZATION   INDICATIONS:  She was admitted to the hospital with chest pains typical  for unstable angina.  She has undergone multiple cardiac  catheterizations in the past.  She has stents in the saphenous vein  graft to the right coronary artery.  She also has native vessel stents;  however, all the native vessels are occluded except for a AV groove  circumflex.  Because of her high-risk features and due to symptoms  suggestive of unstable angina, she was actually started on heparin and  Integrilin and she was ruled out for myocardial infarction, now brought  to the cardiac catheterization lab to reevaluate his coronary anatomy.   HEMODYNAMIC DATA:  The left ventricular pressure was 160/5 with end-  diastolic pressure of 17 mmHg.  Aortic pressure was 148/56 with a mean  of 85 mmHg.  There is no significant pressure gradient across the aortic  valve.   ANGIOGRAPHIC DATA:  Left ventricle:  Left ventricular systolic function  was normal with an ejection fraction of 60%.  There was no regional wall  motion abnormality.  There was no significant mitral regurgitation.   Native left main coronary artery and right coronary artery are occluded.  The left main coronary artery continues as a small circumflex AV nodal  branch.   SVG to RV acute marginal and PDA.  The SVG to PDA and acute marginal is  widely patent.  The previously placed stents are widely patent.  There  are extensive collaterals noted to the left system from the right  coronary artery.   SVG to D1 is widely patent.   SVG to obtuse marginal 1 is widely patent.  Again, extensive  collaterals  were noted through the obtuse marginal 1 to the left and the right  system.   LIMA to LAD:  LIMA to LAD is widely patent.   IMPRESSION:  Widely patent saphenous vein graft to acute marginal branch  and posterior descending artery, saphenous vein graft to obtuse marginal  1, saphenous vein graft to diagonal 1, and left internal mammary artery  to left anterior descending.  There was significant improvement in the  flow with intracoronary nitroglycerin administration.   RECOMMENDATIONS:  The patient states that she is unable to tolerate  Ranexa.  For coronary vasospasm, we will add Norvasc to her present  regimen.  She will be discharged home in the morning if she remains  stable.   A total of 100 mL of contrast was utilized for diagnostic angiography.   TECHNIQUE OF PROCEDURE:  A 6-French multipurpose B2 catheter was  advanced over a J-wire into the left ventricle, left ventriculography  was performed both in LAO and RAO projection.  Same catheter was  utilized to  engage the left main coronary artery and also right coronary artery and  also the same catheter was utilized to engage selectively the saphenous  vein graft and left internal mammary artery.  The catheter then pulled  out of the body in the usual fashion.  The patient tolerated the  procedure well.  No immediate complications were noted.      Cristy Hilts. Jacinto Halim, MD  Electronically Signed     JRG/MEDQ  D:  07/18/2008  T:  07/19/2008  Job:  [id number]   cc:   Ramon Dredge L. Juanetta Gosling, M.D.

## 2010-07-27 NOTE — Discharge Summary (Signed)
[name], GOATLEY NO.:  [id number]   MEDICAL RECORD NO.:  [id number]          PATIENT TYPE:  INP   LOCATION:  2107                         FACILITY:  [facility]   PHYSICIAN:  Ritta Slot, MD     DATE OF BIRTH:  01-Aug-1950   DATE OF ADMISSION:  04/20/2007  DATE OF DISCHARGE:  04/21/2007                               DISCHARGE SUMMARY   DISCHARGE DIAGNOSES:  1. Unstable angina, status post cath yesterday, no early in-stent      restenosis.  No significant change in the catheter.  No significant      change in the coronaries according to catheter.  2. Known history of coronary artery disease, status post recent      percutaneous coronary intervention to the saphenous vein graft      right coronary artery in January 2009.  3. A known history of coronary artery bypass graft surgery x5 in 1995.  4. Hyperlipidemia with statin intolerance.  5. Diabetes mellitus, suboptimally controlled.  6. Diabetes neuropathy.  7. Ongoing tobacco use.  8. Abnormally elevated liver enzymes.  9. Anxiety.  10.Depression.   HISTORY:  This is a 60 year old Caucasian female patient of Dr.  Alanda Amass who presented to the emergency room with increasing chest  pain.  And she has been having this discomfort for 3 days and it was  increasing over time, occurred with exertion.  The patient had severe  shortness of breath, nausea, clamminess, complaints of a choking  sensation in her throat.  Dr. Lynnea Ferrier evaluated the patient in the  emergency room and decided to take her to the cath lab for a coronary  angiography to rule out early in-stent restenosis.   HOSPITAL COURSE:  The patient was taken emergently to the cath lab and  underwent catheterization. It did not show any significant changes  compared to the previous studies and the newly inserted stent to  the  SVG to RCA was patent.   LABORATORY:  Her sodium was 132, potassium 4.5, chloride 99, CO2 26, BUN  3, creatinine 0.7 and glucose  142.  Hemoglobin 11.3, hematocrit 32.9,  white blood cell count 9.1, platelet count 350,000.  TSH 3.1023.  Magnesium 1.9.  Cardiac markers negative.   DISCHARGE MEDICATIONS:  1. Coreg 25 mg b.i.d.  2. Nexium 40 mg b.i.d.  3. Plavix 75 mg daily.  4. Aspirin 325 mg daily.  5. Zoloft 50 mg daily.  6. Imdur 60 mg b.i.d.  7. Amitriptyline 100 mg at bedtime.  8. Xanax 0.5 mg q.12 h p.r.n.  9. Levemir 25 units daily.  10.Lumigan as before.  11.Humulin 5 units t.i.d.  12.Diovan 80 mg daily.  13.Sublingual nitroglycerin p.r.n.   The patient was instructed not to drive or lift heavy weights for 3 days  post catheter.  Increase activity slowly.  She will return to see Dr.  Alanda Amass as scheduled after her previous visit before the her previous  fistulization      Raymon Mutton, P.A.      Ritta Slot, MD  Electronically Signed    MK/MEDQ  D:  04/21/2007  T:  04/23/2007  Job:  [id number]   cc:   Gerlene Burdock A. Alanda Amass, M.D.

## 2010-07-27 NOTE — Cardiovascular Report (Signed)
[name], [name]                   ACCOUNT NO.:  [id number]   MEDICAL RECORD NO.:  [id number]          PATIENT TYPE:  OIB   LOCATION:  6529                         FACILITY:  [facility]   PHYSICIAN:  Richard A. Alanda Amass, M.D.DATE OF BIRTH:  05-Apr-1950   DATE OF PROCEDURE:  04/10/2007  DATE OF DISCHARGE:                            CARDIAC CATHETERIZATION   PROCEDURE:  Retrograde central aortic catheterization, selective  coronary angiography by Judkins technique, selective saphenous vein  graft angiography, pre and post intracoronary nitroglycerin  administration, selective LIMA, LV angiogram RAO/LAO projection,  abdominal aortic angiogram, PA projection, hand injection.   OPERATING PHYSICIAN:  Alanda Amass.   COMPLICATIONS:  None.   Dictation ended at this point.      Richard A. Alanda Amass, M.D.  Electronically Signed     RAW/MEDQ  D:  04/10/2007  T:  04/11/2007  Job:  [id number]   cc:   Gerlene Burdock A. Alanda Amass, M.D.  CP Lab  Eldora R. Jacinto Halim, MD  Tera Mater Evlyn Kanner, M.D.  Edward L. Juanetta Gosling, M.D.  Rachael Fee, MD

## 2010-07-27 NOTE — Assessment & Plan Note (Signed)
[facility]                         GASTROENTEROLOGY OFFICE NOTE   [name], [name]                          MRN:          [id number]  DATE:07/26/2006                            DOB:          06/14/1950    PRIMARY CARE PHYSICIAN:  Dr. Kari Baars.   GASTROINTESTINAL PROBLEM LIST:  Intermittent diarrhea began Fall 2006.  Stool cultures negative including C. difficile x2, TSH normal, Giardia  and Cryptosporidium negative, no white cells.  Colonoscopy, September  2006, showed normal-appearing colon, biopsies questionable for  lymphocytic colitis.  The patient had responded to empiric vancomycin,  as there was a suspicion of C. difficile prior to the colonoscopy.  The  patient recurred with voluminous watery diarrhea after stopping  vancomycin, but she quickly responded to repeat course of vancomycin,  tried Entocort and after one pill, her blood sugars went up into the  600s, so she stopped.  Recurrence of dramatic diarrhea late April 2008,  responded very quickly to Entocort 9 mg daily.   INTERVAL HISTORY:  I last saw [name] one week ago. She was about 3 weeks  into a recurrence of extreme diarrhea. I had been concerned that she has  had clostridium difficile with perhaps bacterial overgrowth. 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 She had had a  bad response to Entocort in the past with her blood sugars shooting way  up, but I thought it was probably best to try this again. She started 9  mg of Entocort, and within 2 to 3 days, her diarrhea completely  resolved. 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 Her sugars are a concern. She has had blood sugars in the 400s  but is modifying her insulin to cope with that.   CURRENT MEDICATIONS:  1. Zoloft.  2. Plavix.  3. Aspirin.  4. Imdur.  5. Xanax.  6. Darvocet.  7. Diovan.  8. Coreg.  9. Amitriptyline.  10.Levemir.  11.Novolin.  12.Nitroglycerin.  13.Entocort.   PHYSICAL EXAMINATION:  Weight 147 pounds, blood pressure 120/52, pulse  60.  CONSTITUTIONAL:  Generally well appearing, obviously feeling much  better.  ABDOMEN:  Soft, nontender, nondistended. Normal bowel sounds.   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   Normally, I would keep someone on Entocort 9 mg a day for at least a  month with this condition and then start a slow taper, but her sugars  are of great concern. She is already having some blurry vision and  having trouble with her blood sugars, so I think I will decrease her to  2 pills a day; that will be 6 mg once a day for the next 2 weeks. She  will return to see me in 2 weeks' time. At that point, if she is still  doing well, I decrease her to 1 pill a day and hopefully off after a  week or two of that. Microscopic colitis can have a waxing and waning  course. Normally, it is very responsive to Entocort.  Rachael Fee, MD  Electronically Signed    DPJ/MedQ  DD: 07/26/2006  DT: 07/26/2006  Job #: [id number]   cc:   Ramon Dredge L. Juanetta Gosling, M.D.  Richard A. Alanda Amass, M.D.

## 2010-07-27 NOTE — Cardiovascular Report (Signed)
[name], [name]                   ACCOUNT NO.:  [id number]   MEDICAL RECORD NO.:  [id number]          PATIENT TYPE:  INP   LOCATION:  6529                         FACILITY:  [facility]   PHYSICIAN:  Cristy Hilts. Jacinto Halim, MD       DATE OF BIRTH:  1950-08-23   DATE OF PROCEDURE:  04/10/2007  DATE OF DISCHARGE:                            CARDIAC CATHETERIZATION   ATTENDING CARDIOLOGIST:  Gerlene Burdock A. Alanda Amass, M.D.   PROCEDURE PERFORMED:  Intravascular ultrasound-guided percutaneous  transluminal coronary angioplasty and stenting of saphenous vein graft  to the right coronary artery.   INDICATIONS:  [name] is a 60 year old female with significant  cardiac comorbidities, and has had coronary artery bypass grafting in  the past.  She underwent diagnostic cardiac catheterization for  recurrent chest discomfort by Dr. Susa Griffins, earlier, and was  found to have a borderline stenosis of the saphenous vein graft to the  right coronary artery.  Given her significant symptoms of chest  discomfort which was suggestive of angina pectoris, it was felt that  intravascular ultrasound interrogation, and probable angioplasty to this  lesion would be optimal to proceed.   After I reviewed the cine angiograms with Dr. Alanda Amass, and I did agree  that this lesion appeared to be hazy, and appeared to be around 60%-70%  within the stent, and also an eccentric plaque proximal to the stent by  about 60%-70%.   IVUS DATA:  The intravascular ultrasound interrogation of the saphenous  vein graft to the right coronary artery revealed the previously placed  stent in the mid-to-distal segment of the body was patent with a 3.0 mm  lumen.  However, in the mid segment of the stent, there was a calcified  63% stenosis.  The area lumen measured around 3.9-4.1 mm sq at the  tightest stenotic segment at the calcific spot.  Otherwise the stent,  itself, was widely patent.   Proximal to the previously placed stent,  in the mid-to-distal segment of  the saphenous vein graft, the eccentric plaque that appeared to be 60%,  was actually high-grade with around 60% stenosis followed by a  significant plaque burden that encroached upon the whole catheter.  The  stenosis at least was around 85%.  Hence, I decided to proceed with  stenting for the saphenous vein graft to the right coronary artery.   INTERVENTION DATA:  Successful PTCA and stenting of the saphenous vein  graft to the right coronary artery with implantation of a 3.0 x 23 mm  Promus stent deployed at the 14 atmospheres pressure and postdilated to  a 3.25 x 15-mm Quantum Maverick at 16 atmospheres of pressure.  This  stent, when deployed, overlapped with the previously placed stent.  Post  stent implantation had excellent results noted.  However, the stenosis  with the previously placed old stent from 2006 appeared to be have  significant 70% stenosis.  I felt that we should proceed with the  intervention to this, hence the same balloon was utilized, and a 16  atmosphere pressure inflation for 45 seconds was performed.  Post  balloon angioplasty, angiography revealed excellent results.   Post angioplasty IVUS interrogation revealed excellent wall apposition  of the stents with no residual stenosis.   RECOMMENDATIONS:  The patient will be transferred to 6500 and will be  observed overnight.  She will be continued on Integrilin for at least a  period of 12-18 hours.  Further management is per Dr. Susa Griffins.   TECHNIQUE AND PROCEDURE:  Please see diagnostic cardiac catheterization  report by Dr. Susa Griffins.  Exchanging the 6-French sheath to a 7-  [place] sheath.  A 7-French AL-1 guide was utilized to cannulate the  saphenous vein graft to the right coronary artery.  Using the [facility] for a guidewire and heparin for anticoagulation, I was able to  wire the right coronary artery stenosis.  I performed an IVUS  interrogation  using Galaxy IVUS catheter.  After having performed the  interrogation, I decided to proceed with direct stenting.  A 3.0 x 23 mm  Promus stent was advanced, and overlapped with the previously placed  stent, and deployed at 14 atmospheres pressure for 60 seconds.   Following this a 3.25 x 15 mm Quantum Maverick balloon was utilized, and  balloon inflation was performed at 12 and 16 atmospheres of pressure for  around 30 seconds.  Having performed this, I realized that the calcified  stenosis within the previously placed stent was severe, and hence I  decided to balloon this also with the same Quantum balloon at 6.5%  pressure for 60 seconds and post balloon angioplasty angiography with  excellent results.   The balloon was withdrawn, and IVUS catheter was reintroduced and  intravascular ultrasound interrogation was performed.  Excellent results  were noted.  Then the guide was withdrawn, angiography, guide catheter  disengaged and pulled out of body.  The patient tolerated the procedure  well.  No immediate complication noted.      Cristy Hilts. Jacinto Halim, MD  Electronically Signed     JRG/MEDQ  D:  04/10/2007  T:  04/11/2007  Job:  [id number]   cc:   Ramon Dredge L. Juanetta Gosling, M.D.  Tera Mater. Evlyn Kanner, M.D.

## 2010-07-27 NOTE — Cardiovascular Report (Signed)
[name], [name]                   ACCOUNT NO.:  [id number]   MEDICAL RECORD NO.:  [id number]          PATIENT TYPE:  INP   LOCATION:  6529                         FACILITY:  [facility]   PHYSICIAN:  Richard A. Alanda Amass, M.D.DATE OF BIRTH:  June 23, 1950   DATE OF PROCEDURE:  DATE OF DISCHARGE:                            CARDIAC CATHETERIZATION   CONTINUATION:  As mentioned, this is a very difficult situation with  recurrent accelerated and unstable angina.  In some views, the eccentric  mid SVG to RCA lesion looks approximately 70% or greater.  There is  haziness throughout the DES RCA stent beyond the RV marginal and this  may be in part related to calcification and/or stenosis of this area.  My guess is that there is good residual lumen within the stent.  I am  concerned about the lesion proximal to this.  I cannot be certain  whether this is the cause of her unstable symptoms, but angiographically  it is the only new lesions visible.   I would recommend that she have IVUS interrogation of the SVG to the PLA  for further evaluation and then make final judgments about need for PCI  in this setting.   I have reviewed the case angiographically with Dr. Jacinto Halim, and he agrees  to go ahead with IVUS interrogation of the PLA.   Further report will be dictated by him, but preliminary reviewing the  IVUS, there is concentric calcification around the stent in the mid SVG  to RCA with about 50% narrowing in the reasonable residual lumen of  about 2.5 centimeters or greater in diameter.   There is, however significant eccentric stenosis of the mid graft of  probably greater than 70% to 80%.  In view of her clinical history and  these IVUS finding.  Dr. Jacinto Halim feels that we should proceed with PCI and  probable DES stenting of the midportion of the graft and medical therapy  of the noncritical in-stent restenosis.   CATHETERIZATION DIAGNOSES:  1. 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  2. Chronic coronary artery disease dating back to 1995.      a.     Emergency RCA PTCA with ongoing DMI.      b.     Abrupt occlusion of RCA treated with POBA and subsequent DCA       of RCA next day, Jul 24, 1993.      c.     Restenosis native RCA with PTCA July 1995.  3. PTCA of mid LAD 1995.  4. Progression of disease with subsequent multivessel CABG by Dr.      Tyrone Sage x5 September 1995.  5. Subsequent circumflex and LAD PTCA 1998 and 1999.  Redilatation      with stenting 2004 with a ventral restenosis and occlusion.  6. Last catheterization unchanged at last intervention September 17, 2004,      with a POBA to mid LAD with 2-0 balloon, good result and no long      term re-stenosis on this study  or subsequent.  7. DES stent mid SVG to RCA for high-grade stenosis 2.5/13 Cypher post      dilated with 3-0 balloon high-pressure September 17, 2004, no restenosis      on last study March 22, 2006, moderate restenosis, noncritical on      current study.  8. Progression of disease mid SVG to PLA proximal to RV branch      anastomosis.  Eccentric new lesion - possibly culprit lesion.  9. Patent LIMA to LAD with no LAD restenosis.  10.Patent SVG to bifurcating small diagonal, no significant disease.  11.Patent SVG to bifurcating OM, no change since last study.  12.Collaterals to PDA from SVG to OM unchanged.  13.Well-preserved LV function.  14.Accelerated hypertension, recent  15.IDDM.  16.Severe hyperlipidemia.  17.Multiple statin and medication intolerances as outlined above.  18.Normal renal arteries with normal renal function.  19.Chronic anxiety and depression.  20.Diabetic peripheral.      Richard A. Alanda Amass, M.D.  Electronically Signed     RAW/MEDQ  D:  04/10/2007  T:  04/11/2007  Job:  [id number]   cc:   Jeannett Senior A. Evlyn Kanner, M.D.  Edward L. Juanetta Gosling, M.D.  Cristy Hilts. Jacinto Halim, MD  Rachael Fee, MD

## 2010-07-27 NOTE — Assessment & Plan Note (Signed)
[facility]                         GASTROENTEROLOGY OFFICE NOTE   [name], HANOVER                          MRN:          [id number]  DATE:10/04/2006                            DOB:          1951-03-12    PRIMARY CARE PHYSICIAN:  Ramon Dredge L. Juanetta Gosling, M.D.  Endocrinologist, Dr.  Adrian Prince.  Cardiologist, Susa Griffins, M.D.   GI PROBLEM LIST:  1. Intermittent diarrhea began Fall 2006.  Stool cultures negative      including C. difficile x2, TSH normal, Giardia and Cryptosporidium      negative, no white cells.  Colonoscopy, September 2006, showed      normal-appearing colon, biopsies questionable for lymphocytic      colitis.  The patient had responded to empiric vancomycin, as there      was a suspicion of C. difficile prior to the colonoscopy.  The      patient recurred with voluminous watery diarrhea after stopping      vancomycin, but she quickly responded to repeat course of      vancomycin, tried Entocort and after one pill, her blood sugars      went up into the 600s, so she stopped.  Recurrence of dramatic      diarrhea late April 2008, responded very quickly to Entocort 9 mg      daily.  She was having intermittent abdominal pains, so CT scan      with IV normal contrast was arranged.  This showed some      questionable mild thickening of the distal ileal wall, subtle.   INTERVAL HISTORY:  I last saw Haasini 2 months ago.  She had a CT scan  summarized above and there is a question of some ileal thickening and  this raises the question of chronic underlying inflammation such as  Crohn's disease.  Of note, I did a colonoscopy on her 2 years ago and  her terminal ileum was normal.  She weaned herself of Anticort and was  off completely for several weeks until about 10 days ago when she had a  recurrence of the dramatic watery diarrhea and urgency.  She restarted  the Anticort at three pills a day and within 2-3 days, her symptoms had  responded so well that she was actually constipated and she was taking  Senokot.  She canceled an appointment with me 3 weeks ago.  Currently,  she is on one Anticort a day.   CURRENT MEDICATIONS:  Zoloft, Plavix, Imdur, Xanax, Darvocet, Diovan,  Coreg, amitriptyline, Levemir, Novolin, nitroglycerin, Anticort one pill  a day.   PHYSICAL EXAMINATION:  VITAL SIGNS:  Weight 139 pounds, which is up 1  pound since her last visit, blood pressure 132/60, pulse 72.  GENERAL:  Chronically, ill-appearing.  ABDOMEN:  Soft, mildly tender in right lower quadrant.  Nondistended.  Normal bowel sounds.   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   There is a question raised on her recent CT scan last month of terminal  ileal thickening.  With this and  her ongoing problems, I would like to  repeat her colonoscopy.  If she does have terminal ileitis, this raises  the real possibility that she actually as Crohn's disease which could be  treated quite differently than periodic steroid boluses like I have been  doing with her microscopic colitis.  I will get another look at colon  and repeat biopsies there as well.     Rachael Fee, MD  Electronically Signed    DPJ/MedQ  DD: 10/04/2006  DT: 10/05/2006  Job #: [id number]   cc:   Ramon Dredge L. Juanetta Gosling, M.D.  Tera Mater. Evlyn Kanner, M.D.  Richard A. Alanda Amass, M.D.

## 2010-07-30 NOTE — Cardiovascular Report (Signed)
Erika, [name]                   ACCOUNT NO.:  [id number]   MEDICAL RECORD NO.:  [id number]          PATIENT TYPE:  INP   LOCATION:  3316                         FACILITY:  [facility]   PHYSICIAN:  Richard A. Alanda Amass, M.D.DATE OF BIRTH:  May 24, 1950   DATE OF PROCEDURE:  09/17/2004  DATE OF DISCHARGE:                              CARDIAC CATHETERIZATION   PROCEDURE:  Retrograde central aortic catheterization, selective coronary  angiography via Judkins technique, pre and post IC nitroglycerin  administration, LV subselective RIMA, selective LIMA, saphenous vein graft  angiography, intragraft nitroglycerin, LV angiogram RAO/LAO projection,  abdominal aortic angiogram midstream PA projection, weight-adjusted heparin,  Aggrastat double bolus plus infusion, extra Plavix 150 mg, Cutting balloon  atherectomy and subsequent DES stenting 2.5/13 Cypher in the mid SVG - RCA,  small balloon 1.5 mm dilatation and subsequent 2.0 mm Cutting balloon mid  LAD through LIMA.   PROCEDURE:  Patient brought to second floor CP lab in a post absorptive  state after 5 mg Valium p.o. premedication. She is given intermittent Versed  5 mg and fentanyl 75 mcg during the procedure for sedation. One percent  Xylocaine was used for local anesthesia. The right coronary was prepped,  draped in the usual manner. One percent Xylocaine was used. The RCA was  entered with single anterior puncture using 18 thin wall needle and a 6-  [place] short Daig sidearm sheath was inserted without difficulty. Diagnostic  coronary angiography was done with 6-French 4-cm tapered preformed Cordis  coronary and pigtail catheters. Right coronary catheter was used to  selectively engage the SVG to the diagonal and SVG to the distal circumflex  marginal. A 5-French multipurpose catheter was used for selective SVG to RCA  injection. Right coronary catheter was used for subselective RIMA, and a 5-  [place] IMA catheter was used for selective  LIMA injection, all done by hand.  LV angiogram was done in the RAO and LAO projection, 20 cc, 14 cc per second  for each projection. Pullback pressure of the CA showed no gradient across  the aortic valve. Abdominal aortic angiogram was done in midstream PA  projection at 25 cc, 20 cc per second with visualization to the right SFA  profunda area. The patient essentially had single normal renal arteries  bilaterally. Normal celiac and SMA axis. The infrarenal abdominal aorta had  no significant atherosclerotic disease, no stenosis or aneurysm and the  iliacs were patent. The hypogastrics were present. There was no external  iliac disease of significance.   Catheter was removed. IC nitroglycerin was given into the SVG to RCA and  into the LIMA selectively. The patient tolerated the diagnostic procedure  well.   PRESSURES:  LV:  172/0; LVEDP 18 - 20 mmHg.   CA:  172/67 mmHg.   There is no grade across the aortic valve on catheter pullback.   LV angiogram revealed mild hypokinesis of the distal third of the inferior  wall in the RAO projection and hypo-akinesis of the posterior apical  segment. EF, however, was still approximately 50 to 55% with angiographic  LVH present.  There was no mitral regurgitation present.   Fluoroscopy showed 3 to 4+ proximal LAD, circumflex and right coronary  calcification.   The native right coronary artery was totally occluded proximally with only a  very faint conus branch seen.   The left main was very small without significant stenosis.   The LAD was totally occluded after two minuscule sized diagonal branches  that were probably 1 mm. There were two small septal perforator branches  seen, and the LAD was occluded in the proximal third. There was no antegrade  flow.   The circumflex demonstrated previously placed stent from the ostia down to  the junction of the middle third. There was 40% proximal narrowing, 70% mid  narrowing and 100% narrowing  of the distal portion of the circumflex stent  (December 03, 2002) with only some very faint flow just beyond it. The ABG  branch trifurcated distally and was widely patent and actually had good  flow. With some very faint collaterals to the distal right.   Saphenous vein graft to the diagonal branch was widely patent, excellent  anastomosis and good flow to a bifurcating small diagonal branch without  significant stenosis.   Saphenous vein graft to the distal marginal was widely patent with no  significant stenosis and excellent anastomosis to a distal marginal branch  that trifurcated beyond the graft and did supply some collaterals to the  small PDA of the RCA.   The saphenous vein sequential graft to the RV marginal branch and distal RCA  showed excellent end to side anastomosis to the RV marginal branch. The RV  marginal branch, however, had segmental 70-80% stenosis in the proximal  third but good flow, and it trifurcated distally to some long, thin  branches.   Beyond this was 99% concentric stenosis of the body of the RCA graft. The  graft was anastomosed to the distal RCA by predominately the PLA which  trifurcated. There were thin, long branches without significant focal  stenosis, and good flow was maintained.   The LIMA was widely patent. This subclavian was intact. The vertebral was  antegrade. The LIMA had an excellent anastomosis to the mid LAD and filled  the LAD down to the apex. The LAD was extremely small, however, and  diffusely diseased throughout. There was, however, high-grade focal near  daylight stenosis of greater than 95% in the LAD beyond the insertion and  beyond a small septal perforator branch. This represented progression of  disease.   DISCUSSION:  This is a very complex situation. 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 She lives with her significant other, [name], in [place]. She has IDDM, chronic, and severe chronic  history of  coronary artery disease. She had emergency RCA PTCA with ongoing the DMI Jul 23, 1993, suffered abrupt occlusion. She had assisted [facility] the following day  to maintain patency (Jul 24, 1993). She then had elective LAD PTCA August 26, 1993 and a repeat RCA PTCA September 13, 1993. She had her LAD redilated June 1995  by Dr. Aleen Campi. She subsequently had multivessel CABG x5 by Dr. Tyrone Sage on  November 16, 1993. Since that time, she has had circumflex intervention for  total occlusion January 1998, October 1998. Her last intervention was  September 2004 after receiving the non-DES stents and subbrachytherapy of  the proximal circumflex. She had Cutting balloon atherectomy and DES  stenting of the proximal circumflex on December 03, 2002. At that  time, her  grafts were obviously also patent and EF was good.   She has chronic stable angina that is exertional. She has had the ERCP  therapy in the past, and she has been on maximal medical therapy. She has  had difficult time tolerating medications, and she is even been on Renexa  recently but developed elevated LFTs with this which had to be discontinued.  She has been intolerant to all statins in the past and to increased dose of  beta blockers as well, and medical therapy has been a challenge   Over the last several months, she has had progressive angina that has become  unstable prompting hospital admission on September 15, 2004. She has had emergency  room visits in the last several months as well for prolonged chest pain. We  have been trying to treat her medically but felt that repeat angiography  would be necessary. Therefore, infarction was ruled out by serial enzymes  and EKGs.   She actually has culprit lesions at present, and her high-grade SVG to the  distal RCA and mid LAD are suitable for intervention, although the LAD is a  very small diffusely diseased vessel.   We elected to proceed with intervention in this setting. The  patient was  given weight-adjusted heparin of 4,000 units, double bolus Aggrastat plus  infusion, extra 150 of Plavix which she had been on this chronically, Pepcid  20 IV.   The SVG to RCA was intubated with a JR-4 6-French Scimed guiding catheter.  We then able to cross the stenosis with a 0.014-inch Asahi soft wire. The  distal graft was not large enough for a filter wire. The lesion was dilated  with a 2.59 Maverick. However, there was slipping on the lesion, so this was  abandoned. A 2.5/10 Scimed Cutting balloon was then positioned across the  lesion which was dilated 06-38, 06-24.   This was exchanged for a 2.5/13 Cypher stent which was positioned across the  lesion and dilated at 16 - 37 and 19 - 35. The balloon was pulled back.  There was some minimal residual narrowing, so we elected to upgrade the  balloon to a 2.75 12 Quantum Maverick. I changed that to a 2.758 PowerSail noncompliant balloon, and this was dilated and the stent was dilated to 20 -  43. Stenosis was reduced from 99% to essentially 0% with good TIMI III flow  and no reflow and no evidence of distal embolization. The patient  tolerated this well.   We then intubated the LIMA with a 6-French side hole Cordis guiding  catheter. An Asahi 0.014-inch light wire was used to traverse the tortuous  LIMA. A 1.5/12 Voyager balloon was then advanced over the guidewire with the  balloon wire combination. We were able to cross the stenosis and free in the  distal LAD. The lesion beyond the graft insertion was dilated to 10-47, 15-  47 and 20-33.   We then exchanged for a upgraded to 0.0/10-mm Cutting balloon, recognizing  that this was undersized for the vessel size but not for the remainder of  the lumens. This was positioned and dilated at 06-49 seconds. The Cutting  balloon was pulled back. IC nitroglycerin was given, and final injection  showed stenosis reduction of 95% to less than 20. There was residual 50-70%   disease throughout the entire small LAD, but there was good flow down to the  apex with no side branch occlusions. The patient tolerated this well. The  dilatation system was removed. Side-arm sheath was flushed, secured to the  skin. The patient was transferred to the holding area for postoperative care  in stable condition. She tolerated the procedures well.   She has had successful Cutting balloon and DES stenting of the SVG to the  PDA. She has had successful Cutting balloon atherectomy small vessel of the  LAD beyond the LIMA insertion. There is good residual function, patent SVG  to diagonal, patent SVG to the distal circumflex. Recommend continued  medical therapy. We may try Lescol as the only statin available that she has  not been able to take that she has not tried in the past. Continued diabetic  control and follow up by Dr. Ardyth Harps.   CATHETERIZATION DIAGNOSIS:  1.  Arteriosclerotic heart disease  - unstable angina without myocardial      infarction.  2.  Successful Cutting balloon and DES stenting, SVG to PLATELETS.  3.  Successful Cutting balloon small vessel mid-LAD beyond LIMA insertion      successful.  4.  Patent SVG to small bifurcating diagonal; patent SVG to distal      trifurcating small marginal.  5.  Occluded native coronary arteries proximal LAD, after several small      septal perforator and diagonal; occluded native circumflex after PAVG      branch proximal; proximal occlusion RCA.  6.  Well-preserved LV function, EF 50-55%, segmental wall motion      abnormality.  7.  Systemic hypertension, normal renal arteries.  8.  IDDM.  9.  Hyperlipidemia - multiple statin intolerance.  10. Elevated LFTs on Renexa for angina.  11. Chronic back pain.  12. Gastroparesis - gastroesophageal reflux disease.  13. Multiple prior percutaneous coronary interventions.      1.  Emergency RCA PTCA Jul 23, 1993.     2.  Abrupt reocclusion PTCA and [facility] RCA Jul 24, 1993.       3.  PTCA for restenosis RCA September 13, 1993.      4.  Mid-LAD PTCA August 21, 1993.      5.  Multivessel CABG 95/95, Dr. Jaynie Collins x5.      6.  Circumflex and LAD PTCA in 1998/1999 and December 03, 2002 as          outlined above.       RAW/MEDQ  D:  09/17/2004  T:  09/17/2004  Job:  [id number]   cc:   Jeannett Senior A. Evlyn Kanner, M.D.  [address]  [place]  [place] [address]  Fax: [phone]

## 2010-07-30 NOTE — Discharge Summary (Signed)
NAMEVERIA, STRADLEY NO.:  [id number]   MEDICAL RECORD NO.:  [id number]          PATIENT TYPE:  INP   LOCATION:  2038                         FACILITY:  [facility]   PHYSICIAN:  Richard A. Alanda Amass, M.D.DATE OF BIRTH:  Oct 19, 1950   DATE OF ADMISSION:  03/21/2006  DATE OF DISCHARGE:  03/23/2006                               DISCHARGE SUMMARY   Ms. Ailen Strauch is a 60 year old white female with a history significant  for coronary disease, seen in the office, and she had crescendo angina;  thus, she was admitted for further evaluation.  She was put on IV  heparin and the decision was made that she should undergo cardiac  catheterization.  This was performed on March 22, 2006 by Dr. Lenise Herald.  She had no significant progression of her disease.  She does  have severe CAD with small vessel diabetic disease distal to her SVG and  LIMA grafts.  Her last procedure had been done by Dr. Alanda Amass on September 17, 2004, at which time she had a cutting balloon to her mid LAD beyond  the LIMA insertion site.  She had a DE stent placed to SVG to her PDA.  The stent in her SVG to her PDA only had 40% restenosis and a POBA site  had about a 50% restenosis.  Medical therapy was decided to be  continued.  Her EF is 60% on March 23, 2006.  She was seen by Dr.  Susa Griffins.  She again did not want EECP.  She has not been on a  statin because she apparently has stated before she could not tolerate  it but she decided to try Crestor 2.5 mg daily.  She stated she was  unable to take Ranexa because of elevated LFTs.  She was asked to follow  up with Dr. Christella Hartigan for possible esophageal stricture, which she has had  in the past.  She will see Dr. Alanda Amass in about a month.  He thought  she was stable to be discharged home.  She had no further angina and her  blood pressure was 123/63, heart rate 65, respirations 20, temperature  97.7, O2 saturation 96%.   DISCHARGE MEDICATIONS:  1.  Aspirin 81 mg a day.  2. Imdur 60 mg a day.  3. Nexium 40 mg a day.  4. Plavix 75 mg a day.  5. Coreg 25 mg twice a day.  6. Amitriptyline 100 mg at bedtime.  7. Metamucil 2 tablespoons at bedtime.  8. Xanax 0.5 mg p.r.n.  9. Diovan 80 mg 1/2 everyday.  10.Zoloft 25 mg a day.  11.Humulin R 5 units before meals.  12.I believe she is on Levemir or Lantus 23 units in the morning.  13.Lumigan eye drops.  14.Crestor 5 mg 1/2 everyday.   FOLLOWUP:  She will followup with Dr. Alanda Amass in a month and Dr.  Christella Hartigan, she will call to make an appointment.   DISCHARGE ACTIVITIES:  She is to do no strenuous activity, exercise,  pushing or pulling for about a week.   DISCHARGE DIAGNOSES:  1. Chronic angina.  2. Known coronary disease with small vessel diabetic disease distal to      her graft, status post cardiac catheterization this admission with      last stent to her saphenous vein graft to her posterior descending      artery patent, and plain old balloon angioplasty site also patent      in her left anterior descending distal to her left internal mammary      artery to her left anterior descending.  She has an old total      occluded saphenous vein graft to I believe either a circumflex or a      ramus.  3. Insulin-dependent diabetes mellitus, not under good control with a      hemoglobin A1c of 8.8.  4. Anxiety.  5. Hyperlipidemia, has been intolerant to statins in the past.  6. Hypertension with normal renal arteries.  7. Gastroparesis, secondary to diabetes.  8. Chronic back pain.  9. History of elevated liver function tests on Ranexa.      Lezlie Octave, N.P.      Richard A. Alanda Amass, M.D.  Electronically Signed    BB/MEDQ  D:  03/23/2006  T:  03/23/2006  Job:  [id number]   cc:   Rachael Fee, MD  Tera Mater. Evlyn Kanner, M.D.

## 2010-07-30 NOTE — Discharge Summary (Signed)
[place]. [facility]  Patient:    [name], [name]                          MRN: [id number] Adm. Date:  [id number] Disc. Date: [id number] Attending:  Darlin Priestly Dictator:   Halford Decamp. Delanna Ahmadi, R.N., N.P. CC:         Tera Mater. Evlyn Kanner, M.D.   Discharge Summary  HISTORY OF PRESENT ILLNESS:  [name] is a 60 year old unfortunate white female who has diabetes and severe coronary artery disease.  She came to the emergency room by EMS secondary to chest pain unrelieved with nitroglycerin. In the past she has had multiple PTCAs and then a CABG x 5 by Dr. Tyrone Sage in 1995.  She has had subsequent proximal native left circumflex marginal with a stent placed in 1998.  Her last catheterization was in 1999.  She does have a history of chronic unstable angina at home, some anxiety and depression.  She was seen by Dr. Jenne Campus in the ER.  She was placed on IV heparin and IV nitrox.  Dr. Evlyn Kanner was asked to consult and to follow her for her diabetes.  HOSPITAL COURSE:  On September 08, 2000, she underwent cardiac catheterization by Richard A. Alanda Amass, M.D.  Her LV function was well preserved with an EF of approximately 50%.  She had no MR.  Her renals were normal.  She had an occluded native LAD after a diagonal 1, occluded proximal RCA, occluded circumflex OM, and she had chronic severe restenosis of her circumflex proximal stent.  She had a patent LIMA to her LAD, a patent SVG to her RCA, AM and RCA distal.  She had a patent SVG to her diagonal and a patent SVG to her OM.  She did have distal stenosis past her grafts of her SVG to her distal RCA which was 90 to 95% in the native vessel.  She had a 99% occlusion of her LAD after the insertion site of her SVG to her diagonal.  Her LIMA to LAD after insertion site was 70 to 80% and this was a very small vessel.  Dr. Alanda Amass reviewed the films with his colleagues and then recommended possible outpatient EECP.  She was seen by  Lenise Herald, M.D. on September 09, 2000, and considered stable to be discharged home with outpatient EEP and follow up with Dr. Alanda Amass as an outpatient.  DISCHARGE MEDICATIONS: 1. Enteric-coated aspirin 325 mg once per day. 2. Protonix 40 mg twice per day. 3. Elavil 100 mg at bedtime. 4. Toprol XL 25 mg at bedtime. 5. Toprol 50 mg in the morning. 6. Imdur 30 mg twice per day. 7. Xanax 0.5 mg in the evening. 8. Insulin as taken previously. 9. Plavix 75 mg once per day.  ACTIVITY:  She is to do no strenuous activity, no lifting and no driving for four days.  She will be on a diabetic diet.  If she has any problems with her groin, she will give [place] a call.  She will be referred for EECP and follow-up with Dr. Alanda Amass in approximately two weeks.  DISCHARGE DIAGNOSES: 1. Angina, chronic stable class III with severe coronary artery disease. 2. Coronary artery disease, status post cardiac catheterization, no    intervention done this admission, we will try EECP as an outpatient.    She has chronically occluded circumflex stent.  She has distal stenosis    after her insertion sites  of her grafts to her distal right coronary artery    to her left internal mammary artery to her left anterior descending and her    saphenuos vein graft to her diagonal. 3. Preserved ejection fraction of 55%. 4. Diabetes mellitus on insulin. 5. Gastroesophageal reflux disease. 6. Diabetic gastroparesis. 7. Diabetic neuropathy. 8. Depression and anxiety. 9. Glaucoma. DD:  10/12/00 TD:  10/12/00 Job: [id number]

## 2010-07-30 NOTE — Op Note (Signed)
[place]  Patient:    [name], [name]                            MRN: [id number] Proc. Date: 09/08/00 Attending:  Gerlene Burdock A. Alanda Amass, M.D. CC:         Tera Mater. Evlyn Kanner, M.D.  Lenise Herald, M.D.   Operative Report  PROCEDURE:  Retrograde central aortic catheterization, selective coronary angiography, pre and post IC nitroglycerin administration, LV angiogram, RAO/LAO projection, selective LIMA, saphenous vein graft angiography, abdominal aortic angiogram, midstream PA projection.  DESCRIPTION OF PROCEDURE:  The above procedure was done with 6-French 4 cm ______ preformed Cordis coronary pigtail catheter using ______ procedure. The patient was hydrated preoperatively with normal creatinine of less than 1. She had had intermittent chest pain throughout the day and came to the laboratory on IV nitroglycerin and heparin was held on call to the laboratory. She had been on aspirin and beta-blockers, antianxiety agents, antidepressants prior to the aspirin and Plavix, prior to the procedure.  Following coronary angiography, selective saphenous vein graft angiography was done with the right coronary catheter.  Selective LIMA was done with the right coronary catheter.  LV angiogram was done in the RAO/LAO projection as 25 cc/14 cc per second, 20 cc/12 cc per second.  Pullback pressure was performed showing a gradient across the aortic valve.  Abdominal aortic angiogram was done in the midstream PA projection at 25 cc by hand injection and again above the iliac bifurcation by hand injection.  PRESSURES: 1. LV:  163/6; LVVP 18 mmHG. 2. CA:  163/66 mmHg.  There was no gradient across the aortic valve or catheter pullback.  The previously placed near-stent in the mid circumflex artery was visualized fluoroscopically.  The renals were seen bilaterally, widely patent.  There was a patent IMA and patent proximal celiac and SMA access.  There was 30%  narrowing of the RICA that was smooth.  The hypogastrics were intact and there was good runoff below the external iliac that appeared normal but the SFA bifurcation was not visualized on this study.  There was no significant renal atherosclerotic disease, aneurysm, or stenosis.  There was 1-2+ coronary calcification.  It was mild to moderate.  No intracardiac or valvular calcification.  LV angiogram showed mild hypokinesis in the mid anterolateral wall and the basilar inferior wall and posteroapical segment.  Overall EF, however, was well preserved at about 55%.  There was no mitral regurgitation with sinus rhythm.  Native coronary angiography.  The main left coronary was small but normal without significant stenosis.  The very proximal and ostial LAD had 99% stenosis before small four septal perforator and the jeopardized small diagonal of approximately 1 mm.  The LAD was totally occluded beyond the septal with no antegrade flow.  The circumflex artery had a native artery, had a previously placed 2.5/32 Nir stent that had been placed December 13, 1996 for angina postoperatively.  She had restenosis and was redilated April 03, 1996 with balloon angioplasty alone (upgraded 2.5 balloon).  She subsequently had functional reocclusion September 26, 1997 within the stent involving the native circumflex across the PABG branch and she was treated medically at that time.  The circumflex itself was about a 2.5 mm vessel that trifurcated distally. There was only ghost filling of one small marginal from the mid circumflex by collaterals.  The right coronary artery was totally occluded in the very proximal third after a  small conus branch.  Findings in the native coronary arteries were essentially unchanged from prior study of September 26, 1997.  The native coronary is patent and the native circumflex is patent but high-grade chronic ISR is present.  Saphenous vein graft to the distal OM is an  excellent graft, which is widely patent, with an excellent end-to-end anastomosis and a moderately large marginal that bifurcates with no significant disease.  SVG to the diagonal branch shows an excellent end-to-end anastomosis with a superior and inferior bifurcation branch.  The inferior branch has segmental 99% stenosis and both branches are about 1.5 mm or less.  The superior branch has no significant stenosis.  Saphenous vein sequential grafts to the RV marginal branch of the RCA and in the distal RCA is an excellent graft with no significant disease.  The RV branch coming anastomosed to the mid portion of the SVG has a 95% stenosis but there is antegrade filling.  The distal RCA PLA has 90-95% segmental narrowing in a small vessel and there is a small PVA.  There was also collateral filling of a small PVA from the graft to the OM.  The LIMA is widely patent, relatively small, but not atretic, and anastomosed end-to-side into the mid LAD.  There was good antegrade filling of her very small LAD down the apex, approximately 2 mm vessel, and there is 70-80% segmental narrowing of the LAD beyond the anastomosis.  There was backfilling of one small septal.  [name] has a history of severe IDDM, hyperlipidemia, and chronic angina. She has had prior remote angioplasties (see old records) and ultimately for restenosis.  She had multivessel CABG x 5 by Dr. Tyrone Sage November 16, 1993.  She developed recurrent chest pain and had stenting of her native protected circumflex on December 13, 1996 with a long 2.5/32 Nir stent; however, she developed restenosis April 03, 1996 and was redilated and then restenosis at her last study September 26, 1997 and this was treated medically.  She has a chronic chest pain syndrome but was admitted this time with more symptoms of unstable angina.  Myocardial infarction has thus far been ruled out by serial  enzymes and EKGs.  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  She has very small vessels beyond her graft.  The best graft is the one to the distal marginal branch.  There is significant disease of the RCA beyond the right coronary graft, beyond the inferior branch of the diagonal graft, and disease of the LAD, although it remains patent.  She is a candidate, I believe, to best optimize medical therapy and consider for outpatient EECP.  Unfortunately, she is requiring narcotic analgesics in the hospital now on a fairly regular basis, along with high-dose antianxiety agents for treatment.  Mrs. Eilers is single.  She has a significant other [name]) whom she lives with. 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  CATHETERIZATION DIAGNOSES: 1. Arteriosclerotic heart disease.    a. Status post remote percutaneous transluminal coronary angioplasties with       restenosis.    b. Subsequent coronary artery bypass graft x 5 November 16, 1993;       Dr. Tyrone Sage.    c. Native circumflex culprit lesion stenting.  1998 subsequent in-stent       reocclusion redilatation April 03, 1996; subsequent repeat stenting       native circumflex December 13, 1996; subsequent diffuse  restenosis       functional occlusion circumflex stent September 26, 1997 study. 2. Patent grafts.    a. Patent left internal mammary artery to left anterior descending.    b. Patent saphenous vein graft to DX2.    c. Patent saphenous vein graft to third obtuse marginal.    d. Patent saphenous vein graft sequential RV branch of right coronary       artery and distal right coronary artery.    e. Well-preserved left ventricular function despite focal wall motion       abnormalities, ejection fraction 50-55%, no mitral regurgitation. 3. Chronic angina, exertional, now unstable angina without myocardial    infarction. 4. Depression, reactive. 5. Insulin-dependent diabetes with  neuropathy and skin lesions of NDL. 6. Systemic hypertension. 7. Gastroesophageal reflux disease. 8. Normal renal function with normal renal arteries.  IMPRESSION:  Very difficult management problem.  Once again, I think the best choice at present is to optimize medical therapy.  Consider EECP associated reassurance and support for her psychological overlay in this severe, chronic illness.  She does not appear to be a suitable candidate for reoperation and not a suitable candidate for PCI with very small distal vessels and diffuse disease beyond all of her grafts except that to the CX/OM.  Cineangiograms will be reviewed with my colleague with further recommendations pending. DD:  09/08/00 TD:  09/09/00 Job: [id number]

## 2010-07-30 NOTE — Cardiovascular Report (Signed)
NAMESALLEE, HOGREFE NO.:  [id number]   MEDICAL RECORD NO.:  [id number]          PATIENT TYPE:  INP   LOCATION:  2038                         FACILITY:  [facility]   PHYSICIAN:  Darlin Priestly, MD  DATE OF BIRTH:  [birth year]   DATE OF PROCEDURE:  DATE OF DISCHARGE:                            CARDIAC CATHETERIZATION   PROCEDURES:  1. Left heart catheterization.  2. Coronary angiography.  3. Left ventriculogram.  4. Saphenous vein graft.  5. Left intramammary angiography.   ATTENDING:  Dr. Lenise Herald   COMPLICATIONS:  None.   INDICATION:  [name] is a 60 year old female patient of Dr. Franchot Heidelberg and Dr. Ardyth Harps with a history of diabetes, CAD status  post coronary artery bypass surgery in 1995 consisting of LIMA to LAD,  vein graft to diagonal, vein graft to OM and sequential vein graft to  acute marginal and RCA.  She has had multiple cardiac catheterizations  since that time with POBA of the LAD through the LIMA as well as a stent  to the mid-vein graft to the acute marginal and PDA just after the  insertion on the acute marginal.  She was recently admitted with a  recurrent chest pain, is now sent for repeat catheterization to reassess  her grafts.   DESCRIPTION OF OPERATION:  After obtaining informed written consent, the  patient was brought to the cardiac cath lab, the right groin was shaved,  prepped and draped in the usual sterile fashion.  Anesthesia monitor  established.  Using the modified Seldinger technique, a 6-French  intraarterial sheath was inserted into the right femoral artery.  A 6-  [place] diagnostic catheter was used to perform diagnostic angiography.   The left main is a small-to-medium-sized vessel with a 60% mid-vessel  stenosis.   The LAD is totally occluded at its ostium.  The mid and distal LAD  filled the patent LIMA which inserts in the early mid-portion of the  LAD.  The IMA is widely patent.  The mid-LAD at  the site of the previous  POBA appears to have approximately 50% stenosis, though the entire LAD  is a small vessel 1.5 mm.  There are faint left-to-right collaterals to  the distal RCA.   There is a patent saphenous vein graft to a small first diagonal.  The  vein graft is widely patent.  The diagonal again is a small diffusely  diseased vessel up to 99% in the lower bifurcation.   The left circumflex is a small vessel with course to the AV groove and  gives rise to 1 totally occluded obtuse marginal which is noted to have  a stent in its proximal portion which is totally occluded.   There is a patent vein graft to an obtuse marginal with no disease in  the vein graft or distal graft insertion.  Again, there are faint left-  to-right collaterals to the distal PDA from the OM.   The RCA is totally occluded in its ostium.  There is a patent sequential  vein graft to an acute RV  marginal as well as the RCA.  There is a stent  noted in the mid-portion of the vein graft just beyond the touchdown of  the acute marginal.  The stent appears to have approximately 40% instant  restenosis.  The remainder of the graft has no significant disease.   The RV marginal branch is noted to have a 90% proximal stenosis which is  chronic.   The distal RCA as well as the PDA and posterolateral branch are small  with diffusely diseased vessels.   The left ventriculogram reveals a preserved EF of 60%.   Hemodynamic:  Systemic arterial pressure 94/47, LV systemic pressure  99/2, LVEDP of 11.   CONCLUSION:  1. Significant left main three-vessel CAD.  2. Patent LIMA to the LAD with 50% mid-LAD stenosis at the site of her      previous POBA.  The LAD is a small 1.5 mm vessel.  3. Patent saphenous vein graft to the small diagonal branch which is      diffusely diseased.  4. Patent saphenous vein graft to the obtuse marginal with no disease      beyond the graft and distal graft insertion.  5. Patent  sequential vein graft to RV acute marginal as well as the      RCA.  The vein graft has a stent noted in its mid-portion with 40%      instant restenosis.  PDA is a small diffusely diseased vessel      beyond the touchdown.  The RV marginal is noted to have a 90%      proximal stenosis which is chronic.  6. Normal left ventricular systolic function.      Darlin Priestly, MD  Electronically Signed     RHM/MEDQ  D:  03/22/2006  T:  03/22/2006  Job:  [id number]   cc:   Tera Mater. Evlyn Kanner, M.D.  Richard A. Alanda Amass, M.D.

## 2010-07-30 NOTE — Discharge Summary (Signed)
NAMEANALYSE, ANGST NO.:  [id number]   MEDICAL RECORD NO.:  [id number]          PATIENT TYPE:  INP   LOCATION:  3316                         FACILITY:  [facility]   PHYSICIAN:  Nanetta Batty, M.D.   DATE OF BIRTH:  1950-09-07   DATE OF ADMISSION:  09/15/2004  DATE OF DISCHARGE:  09/18/2004                                 DISCHARGE SUMMARY   DISCHARGE DIAGNOSIS:  1.  Crescendo angina.  2.  Significant coronary artery disease with cutting balloon and DES      stenting to the saphenous vein graft to the right coronary artery and      cutting balloon angioplasty small vessel mid left anterior descending      beyond the left internal mammary artery insertion site.  3.  Negative myocardial infarction.  4.  History of coronary artery disease with coronary artery bypass grafting      April 03, 1993.      1.  Chronic angina.      2.  Has undergone EECP as long as she could tolerate it but has not been          able to resume it.      3.  Ranexa due to intolerance with dizziness, light headedness, and off          balance.  5.  Well preserved left ventricular function, ejection fraction 50-55%.  6.  Systemic hypertension with normal renal arteries.  7.  Insulin dependent diabetes mellitus.  8.  Hyperlipidemia with multiple statin intolerances.  9.  Elevated liver function tests.  10. Angina.  11. Chronic back pain.  12. Gastroparesis and gastroesophageal reflux disease.   CONDITION ON DISCHARGE:  Improved.   PROCEDURES:  1.  September 17, 2004, right and left heart catheterization.  2.  September 17, 2004, cutting balloon angioplasty and DES stenting to the      saphenous vein graft to the RCA, cutting balloon angioplasty to small      vessel mid LAD beyond the LIMA insertion site.   DISCHARGE MEDICATIONS:  1.  Toprol XL 50 mg daily.  2.  Prevacid 30 mg b.i.d.  3.  Enteric coated aspirin 81 mg 2 tabs daily.  4.  Imdur stopped.  5.  Plavix 75 mg daily.  6.  Diovan 80 mg  1/2 tab daily.  7.  Zoloft 50 mg daily.  8.  Lantus insulin 25 units every morning.  9.  Humulin R 5 units before meals.  10. Xanax 0.25 mg at bedtime.  11. Amitriptyline 100 mg every evening.  12. Lescol XL 40 mg daily.  13. Eyedrops as before.  14. Maxzide 1 daily as before.  15. Nitroglycerin as needed.   DISCHARGE INSTRUCTIONS:  1.  Low fat, low salt, diabetic diet.  2.  Increase activities slowly.  3.  Wash cath site with soap and water, call if any bleeding, swelling, or      drainage.  4.  Follow up with Dr. Alanda Amass October 08, 2004.   HISTORY OF PRESENT ILLNESS:  60 year old white female cardiology  patient of  Dr. Alanda Amass with severe coronary disease and coronary insufficiency with  multiple interventions since her bypass grafting in 1995.  Her previous  cath, the last one done December 03, 2002, she was noted to have small  vessels, four anastomosis grafts with diffuse disease.  She did undergo, at  that time, angioplasty of the proximal LAD with a DES stent to the proximal  and mid circumflex, angioplasty of the AV groove.  Last month, she was seen  in the office with progressively worse angina, tried on Ranexa and increased  dose of Toprol, though became intolerant of the Ranexa as well as elevated  LFTs.  We attempted increasing her Imdur and if this did not work, she would  need the heart cath.  On September 15, 2004, the patient was admitted to [facility] ER.  She had chest pain at 9:30 that morning and burning bilateral neck to jaws  and down both arms with diaphoresis, shortness of breath, and nausea.  Her  partner called EMS, two nitro were given, pain was relieved when EMS  arrived.  However, it recurred and she was brought to the hospital.   PAST MEDICAL HISTORY:  As previously described under discharge diagnosis.   OUTPATIENT MEDICATIONS:  Essentially the same as discharge meds except we  stopped her Imdur and she is off Naprosyn.   ALLERGIES:  Codeine, Biaxin,  Altace, Lipitor, Demerol, statins.  We are  trying the Lescol XL to see if she can tolerate it.   For social history, family history, and review of systems, see H&P.   DISCHARGE PHYSICAL EXAMINATION:  Alert and oriented white female.  Blood  pressure 110/50, pulse 65, respirations stable, oxygen saturation 95%.  Lungs clear to auscultation posteriorly.  Heart regular rate and rhythm  without murmur.  Abdomen soft, positive bowel sounds.  Lower extremities  without edema, pedal pulses are 2+, groin cath site was stable.  The patient  had difficulty with motion, very weak.   Our plan had been to repeat a CBC and discharge her home.  We attempted the  CBC and they stuck her 3-4 times, unable to get, the patient was discharged  home without CBC and that will be followed as an outpatient.   LABORATORY DATA:  On admission, hemoglobin 11.5, hematocrit 33, WBC 14,  platelets 261, neutrophils 72, lymphs 22, monos 6, eos 1, baso 0.  White  count came down at discharge to 7.3, hemoglobin 10, hematocrit 28.5,  platelets 328.  Protime 14.5, INR 1.1.  Heparin levels were therapeutic.  Sodium 138, potassium 4.1, chloride 105, CO2 26, glucose 268, BUN 7,  creatinine 0.7, calcium 8.7, total protein 6.1, albumin 3.5, AST 19, ALT 15,  ALP 64, total bilirubin 0.4, magnesium 1.9.  These remained stable.  Glucose  was up and down.  Cardiac CKs ranged 57 to 63, MBs 0.6 to 0.9, troponin I  0.01.  Total cholesterol 198, triglycerides 165, HDL 60, LDL 105.  TSH  1.226.  UA was clear.  Urine culture no growth.  Chest x-ray showed no  congestive heart failure, suspicion for COPD, history of CABG, coronary  artery stents visible, left base linear atelectasis versus scarring, note  that the patient had chest x-ray June 06, 2002, reporting no evidence of  active disease, but did report metallic stent in the coronary arteries.  EKG the day before admission was sinus rhythm, no significant change.  July 7,  sinus  rhythm, nonspecific ST abnormality.   HOSPITAL  COURSE:  Ms. Scaletta was admitted by Dr. Allyson Sabal on call for Dr.  Alanda Amass September 15, 2004, secondary to chest pain and increasing angina and  crescendo angina.  She was admitted and placed on IV heparin and IV  nitroglycerin and was in the transitional care unit on 3300.  The plan was  for her to undergo cardiac catheterization on September 16, 2004, but due to a  full schedule, that was held, the patient was continued in the 3300 unit.  Rescheduled her cath and we discussed Mevacor versus Lescol for the patient.  September 17, 2004, the patient underwent cardiac catheterization as described,  tolerated it well, and by September 18, 2004, she stable but very weak.  She was  seen by Dr. Launa Grill, who requested a CBC prior to discharge.  We  attempted several sticks, the patient was somewhat agitated with all the  sticks, and demanded to go home.  We agreed to let her go at that point and  we would follow her up as an outpatient for CBC.  If she had any increasing  weakness, she was to call us back.      [name]. Annie Paras, N.P.      Nanetta Batty, M.D.  Electronically Signed    LRI/MEDQ  D:  11/24/2004  T:  11/24/2004  Job:  [id number]   cc:   Gerlene Burdock A. Alanda Amass, M.D.  Fax: [phone]   Tera Mater. Evlyn Kanner, M.D.  Fax: [phone]

## 2010-07-30 NOTE — Cardiovascular Report (Signed)
NAME:  [name], [name]                             ACCOUNT NO.:  [id number]   MEDICAL RECORD NO.:  [id number]                   PATIENT TYPE:  OIB   LOCATION:  6523                                 FACILITY:  [facility]   PHYSICIAN:  Richard A. Alanda Amass, M.D.          DATE OF BIRTH:  05/13/1950   DATE OF PROCEDURE:  12/03/2002  DATE OF DISCHARGE:                              CARDIAC CATHETERIZATION   PROCEDURES PERFORMED:  1. Retrograde central aortic catheterization.  2. Selective coronary angiography pre- and post intracoronary nitroglycerin     administration.  3. Left ventricular angiogram, right anterior oblique and left anterior     oblique projections.  4. Saphenous vein graft angiography and left internal mammary artery.  5. Abdominal aortic angiogram, hand injection midstream posteroanterior     projection.  6. Aggrastat double bolus plus infusion.  7. Per os Plavix and aspirin.  8. Weight-adjusted heparin.  9. Double-wire technique percutaneous coronary intervention with:     A. Percutaneous transluminal coronary angioplasty, ostial left anterior        descending stenosis,     B. Cutting balloon atherectomy, diffuse high-grade segmental in-stent        restenosis proximal-mid circumflex,     C. Tandem stenting, proximal-mid circumflex, CYPHER drug eluding stent.        2.5/18 times two; and,     D. Side branch posterior AV groove percutaneous transluminal coronary        angioplasty through stent.   CARDIOLOGIST:  Richard A. Alanda Amass, M.D.   DESCRIPTION OF PROCEDURE:  Diagnostic procedure was done with 6 French 4 cm  taper preformed Cordis coronary and pigtail catheters with Omnipaque dye  used throughout the procedure.  The patient was hydrated preoperatively.  Creatinine was 0.6.  She had been continued on aspirin therapy.  The common  right femoral artery was entered with a single anterior puncture with the  patient in the postabsorptive state after 5 mg of Valium p.o.  premedication.  A 6 French short Daig sidearm sheath was inserted without difficulty.  Six  French 4 cm taper Cordis preformed coronary catheters were initially used;  however, selective left coronary angiography required a 3.5 cm taper left  coronary catheter.  Saphenous vein graft angiography was done with the right  coronary catheter.  IMA catheter was used, 6 [place], for selective LIMA  injection.  A pigtail catheter was used for LV angiogram done in the RAO and  LAO projections with 20 mL at 12 mL per second for each projection.  Pullback pressure of the central aorta was performed and showed no gradient  across the aortic valve.  Abdominal angiogram was done above the level of  the renal arteries in PA projection by hand injection and this revealed  bilateral patent normal renal arteries.  There was essentially accessory  renal arteries to the lower poles on each side that were patent in  addition  to the main renal arteries.  The superior mesenteric artery and celiac axis  were intact proximally and the infrarenal abdominal aorta just below the  renals appeared normal.  (No history of claudication and normal ABIs).   The patient tolerated the diagnostic procedure well.  IC nitroglycerin 200  mcg was given during the procedure and also given in divided doses during  the PCI procedure.   PRESSURES:  LV is 140/0.  LVEDP 18 mmHg.  Central aorta 140/70 mmHg.  Systolic pressure varied from 161-096 mmHg during the procedure, but  relatively stable, and she maintained sinus rhythm.  There was no gradient  across the aortic valve on pullback.   FLUOROSCOPIC DATA:  Fluoroscopy showed previously placed stents in the  proximal and mid circumflex artery, and 1-2+ calcification of the right and  left coronary arteries.   ANGIOGRAPHIC DATA:  LV angiogram demonstrated mild hypokinesis of the mid  anterolateral wall, but with no other wall motion abnormality, no MR and EF  approximately 55%.    The main left coronary was normal.   The LAD was totally occluded beyond the very small first diagonal branch.  The first diagonal branch had an 80-90% proximal lesion, which was  essentially unchanged and was small, probably a millimeter vessel.  The LAD  had an ostial 90% stenosis, concentric, which was chronic and slightly  progressed.  There was a small septal perforator branch that filled  antegrade.  The LAD was occluded beyond the DX-1 with no antegrade filling.  The LIMA was widely patent and anastomosed beautifully to the mid LAD.  There was excellent antegrade flow to a very small diffusely diseased LAD  estimated to be greater than 50% throughout the LAD, but no focal high-grade  stenosis.  A small septal was also seen.   The native circumflex had a previously placed stent from December 13, 1996  (NIR 2.5/32).  There was diffuse 95-99% in-stent restenosis just beyond the  ostium of the LAD to the mid circumflex, across the proximal AV groove  branch.  There was another 50% narrowing of the circumflex beyond the  previously placed stent.  There was faint antegrade flow to a trifurcating  marginal.  The proximal AV groove branch was moderately large and gave off  an atrial branch and supplied some collaterals to the distal RCA.  There was  a widely patent SVG to a distal marginal branch with an excellent  anastomosis.  The marginal branch was small-to-moderate size and bifurcated  with no significant stenosis.   The native right coronary artery was totally occluded after its origin with  no antegrade filling.  There was an excellent sequential SVG to the acute  marginal in the mid RCA and a distal anastomosis to the distal RCA beyond  the acute marginal.  The RCA was diffusely diseased beyond this, probably 70-  80% narrowed and a very small vessel of probably a millimeter visualized  angiographically.  It did fill, however.  There was no focal high-grade stenosis and the PLA  did fill.  The PDA was occluded.  There were  collaterals to the PDA through the SVG to the distal OM.  The sequential  graft to the right had no significant stenosis.   The first anastomotic right ventricular branch had 90% and tandem 80%  lesions, bifurcated, but had antegrade flow and this was not significantly  changed from prior angiograms.   A saphenous vein graft to a very small diagonal branch  was widely patent.  It filled a very small diagonal branch that was about 1.5 mm with no  significant stenosis.   DISCUSSION:  Mrs. 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  She  has had chronic angina that has been refractory to medical therapy including  EECP times two.  She has had prior history of PCIs in the past and then  subsequent multivessel CABG times five by Dr. Tyrone Sage on November 16, 1997.  In 1998 she had culprit lesion stenting of her native proximal and  ostial circ, and then redilatation for restenosis January 1998, and  restenting October 1998.  She had diffuse in-stent restenosis and was felt  to be functionally occluded on September 26, 1997.  She has been treated  medically since that time.  She has had difficulty with tolerating statin  medications.  Other than that she has been on optimal medical therapy.  She  has severe angina despite medical therapy and EECP, and a recent Cardiolite  did not show significant ischemia, but this was felt, based on her past  angiography, to probably represent balanced ischemia.  Because of her  clinical symptoms and a recent severe episode she was brought in for  recatheterization to see if there was culprit lesion and/or salvage lesion  intervention that might help.   The patient has a patent circumflex stent with high-grade restenosis.  In  the past when this has been dilated she improves symptomatically.  It was  felt that it was worth a  trial of PCI for salvage procedure.  Her other  vessels essentially have not changed over many years, and particularly since  her last cath of September 08, 2000.   DESCRIPTION OF INTERVENTIONAL PROCEDURE:  The patient was given 300 mg of  Plavix (20 mcg/kg), Aggrastat bolus plus infusion was given and she was  given weight-adjusted heparin 3600 units and another thousand units, and  given intermittent Versed a total of 6 mg, and intermittent Nubain a total  of 10 mg during the procedure.  She was given intermittent IC nitroglycerin  as well, 200 mcg times four to five.   The left coronary was initially intubated with a JL-3.5 guiding catheter for  initial PTCA.  This was then changed because of poor backup to a 6 [place]  CSL-3.5 guiding catheter.  An Asahi 0.014 inch soft guide wire was used to  cross the high-grade in-stenosis restenosis of the circumflex and was free  in the distal circumflex artery.  A second guide wire (double-wire technique) was used to cross the ostial LAD and was an Asahi light 0.014  inch guide wire.  The ostial LAD was dilated with a 1.5 mm, 12 mm length ACS-  4 atrial balloon at 13-52.  We were unable to cross with a 2.0 cutting  balloon so the LAD ostium was crossed with a 2.0/12 Voyager balloon and  inflation was done at 10-42 and 10-32.  This opened the ostium of the LAD  with a fairly good result and stenosis reduction of 90 to less than 40% with  good flow to the small DX and septal perforator.  We the crossed into the  native circumflex with a 2.0/6 cutting balloon (unable to cross with a  2.0/12 cutting balloon).  The balloon was then positioned throughout the in-  stent restenosis and just distal to this, and multiple inflations were done  from five to  10 atmospheres at 26-60 seconds times 10 inflations.  The  cutting balloon was pulled back and the proximal and mid circumflex in-stent  restenosis was dilated with a 2.5/20 ACS Voyager balloon at 10-52,  10-44  within the stent and 5-29 just beyond it.  The circumflex was then stented  from distal to proximal with two overlapping 2.5/18 CYPHER stents.  The  first one in the mid circumflex was deployed at 12-36, the second one  overlapping was deployed at 14-49, and began just at the ostium of the LAD  without compromising it.  The overlap was dilated at 20 atmospheres for 30  seconds.   There was pinching of the proximal AV groove branch with 95% stenosis.  That  proximal AV groove branch was then crossed with a 0.014 inch wire.  A 1.5  Voyager balloon was used to cross between the patent struts and dilate the  proximal AV groove ostium at 15-48.  Then we were able to upgrade this to a  2.0/15 Maverick balloon and dilate a 10-50.  Final injections demonstrated  excellent angiographic result with widely patent tandem stents in the  proximal and mid circumflex segmentally covering the in-stent restenosis and  residual stenosis.  There was TIMI III flow to the distal circumflex and  TIMI III flow to the proximal AV groove branch with ostial stenosis reduced  from 90 to less than 30%.   The patient tolerated the procedure well and she had intermittent chest pain  with inflations relieved with deflations and some mild left upper extremity  discomfort at the end of the procedure.  ACTs were monitored throughout the  procedure and/or kept therapeutic.  The patient has had successful culprit  lesion salvage intervention  of her native circumflex with refractory  angina in a very difficult situation.  She has patent grafts, but very small  diffusely diseased vessels beyond the graft, particularly the LAD and the  RCA that did not appear to be amendable to intervention.  Hopefully, she  will benefit clinically from this procedure of her native circumflex and  proximal AV groove, and ostial LAD.   Continue medical therapy and a trial of Crestor to see if she can tolerate this and/or Zetia for her  known hyperlipidemia.  Continue medical therapy of  her IDDM and associated problems.   CATHETERIZATION DIAGNOSES:  1. Multiple prior percutaneous coronary interventions.  2. Subsequent multivessel coronary artery bypass graft times five, Dr.     Tyrone Sage. November 16, 1993.  3. Patent left internal mammary artery to left anterior descending with     diffuse left anterior descending beyond this.  4. Patent saphenous vein graft to right coronary artery with diffuse disease     beyond graft.  5. Patent saphenous vein graft to small diagonal-2, unchanged.  6. Patent saphenous vein graft to distal obtuse marginal and collaterals to     posterior descending artery.  7. High-grade in-stent restenosis, chronic, diffuse, ostial and proximal     circumflex, treated with cutting balloon atherectomy and tandem drug     eluding stent CYPHER stenting as outlined above, successful.  8. Successful ostial left anterior descending percutaneous transluminal     coronary angioplasty.  9. Successful proximal AV groove ostial side branch percutaneous     transluminal coronary angioplasty.  10.      Well-preserved left ventricular function, ejection fraction 55%.  11.      Insulin-dependent diabetes mellitus.  12.      Reactive  depression.  13.      Systemic hypertension.  14.      Hyperlipidemia.                                                 Richard A. Alanda Amass, M.D.    RAW/MEDQ  D:  12/03/2002  T:  12/03/2002  Job:  [id number]   cc:   Ramon Dredge L. Juanetta Gosling, M.D.  [address]  [place]  [place] [address]  Fax: [phone]   Tera Mater. Evlyn Kanner, M.D.  [address]  [place]  [place] [address]  Fax: [phone]   Cardiopulmonary Laboratory

## 2010-07-30 NOTE — Discharge Summary (Signed)
NAME:  [name], [name]                             ACCOUNT NO.:  [id number]   MEDICAL RECORD NO.:  [id number]                   PATIENT TYPE:  OIB   LOCATION:  6523                                 FACILITY:  [facility]   PHYSICIAN:  Richard A. Alanda Amass, M.D.          DATE OF BIRTH:  [birth year]   DATE OF ADMISSION:  12/03/2002  DATE OF DISCHARGE:  12/04/2002                                 DISCHARGE SUMMARY   HOSPITAL COURSE:  [name] is a patient of Dr. Alanda Amass who was seen in the  office on November 25, 2002, with ongoing complaints of chest pain and was  scheduled for elective coronary angiography and possible intervention with  newer, smaller DAS stents.  The patient presented to the short stay unit on  December 03, 2002, before elective procedure and was brought up to the  catheterization lab.   She underwent coronary angiography by Dr. Alanda Amass and a catheterization  showed patent saphenous vein graft to the RCA, patent SVG to the small  diagonal I and II, patent SVG to distal obtuse marginal, and collaterals to  posterior descending coronary artery, patent LIMA to the LAD and also  catheterization revealed high grade in-stent restenosis of chronic diffuse  ostial and proximal circumflex.  It was treated with cutting balloon  atherectomy and tandem drug eluting stent, CYPHER stenting and also she  underwent successful ostial LAD PTCA and successful proximal AV groove PTCA.   Catheterization also revealed well preserved left ventricular systolic  function with ejection fraction 55%.   The patient tolerated the procedure well and was transferred to the post  catheterization telemetry unit.  The next morning, was stable from  cardiovascular standpoint.  Vital signs stable.  Her post catheterization  CBC showed a white blood cell count 8.8, hemoglobin 10.4, hematocrit 30.1,  platelet count 315.  BMP showed sodium 134, potassium 4.2, chloride 101, CO2  26, glucose 165, BUN 6,  creatinine 0.6.  Lipid profile at the time of  discharge was pending.   The patient was assessed by Dr. Clarene Duke and found to be stable for discharge  home.  Vital signs revealed blood pressure 119/46.  She was afebrile.  Pulse  was 78, respiratory rate 22.   DISCHARGE DIAGNOSES:  1. Coronary artery disease status post coronary artery bypass grafting with     multiple interventions in the past.  2. Status post successful cutting balloon atherectomy and stenting with     CYPHER stent of the culprit lesion and the circumflex proximal to mid     portion.  Status post angioplasties to left anterior descending and     arteriovenous (AV) groove.  3. Insulin-dependent diabetes mellitus.  4. Systemic hypertension.  5. Hyperlipidemia.  6. Reactive depression.  7. Gastroesophageal reflux disease.   DISCHARGE MEDICATIONS:  1. Plavix 75 mg daily.  2. Aspirin 81 mg daily.  3. Crestor 5 mg daily.  4. Prevacid 30 mg daily.  5. Maxzide three times a week.  Resume home dose.  6. Imdur 90 mg daily.  7. Toprol 50 mg daily.  8. Diovan 80 mg daily.  9. Amitriptyline 100 mg q.h.s.  10.      Lantus insulin and Humulin as directed.  11.      Carafate one tablespoon swish and swallow t.i.d.   ACTIVITY:  No driving, no lifting greater than five pounds, no strenuous  activity for three days.   DISCHARGE DIET:  Low fat, low cholesterol, low carbohydrate diet.   FOLLOW UP:  Appointment scheduled with Dr. Alanda Amass on December 20, 2002, at  2:45 p.m.      Raymon Mutton, P.A.                    Richard A. Alanda Amass, M.D.    MK/MEDQ  D:  12/04/2002  T:  12/05/2002  Job:  [id number]   cc:   Jeannett Senior A. Evlyn Kanner, M.D.  [address]  [place]  [place] [address]  Fax: [phone]   Oneal Deputy. Juanetta Gosling, M.D.  [address]  [place]  [place] [address]  Fax: [phone]

## 2010-08-10 NOTE — Discharge Summary (Signed)
[name], [name]                   ACCOUNT NO.:  [id number]  MEDICAL RECORD NO.:  [id number]           PATIENT TYPE:  I  LOCATION:  2040                         FACILITY:  [facility]  PHYSICIAN:  Lang Zingg A. Alanda Amass, M.D.DATE OF BIRTH:  1950/10/05  DATE OF ADMISSION:  06/28/2010 DATE OF DISCHARGE:  07/04/2010                              DISCHARGE SUMMARY   DISCHARGE DIAGNOSES: 1. Non-ST-elevation myocardial infarction this admission secondary to     demand ischemia. 2. Sepsis, no clear etiology with negative urine culture, blood     culture and chest x-ray showing no significant pneumonia, but the     patient presented with hypotension, elevated white count and fever. 3. Acute diastolic heart failure on admission. 4. Hypotension on admission, resolved at discharge. 5. Preserved left ventricular function with an ejection fraction of 55-     60% with grade 2 diastolic dysfunction by echocardiogram this     admission. 6. History of coronary disease with coronary artery bypass grafting in     1995 with recent catheterization in March 2012 showing patent     grafts. 7. Type 2 insulin-dependent diabetes. 8. History of anxiety and depression. 9. Past history of smoking, but no history of chronic obstructive     pulmonary disease.  HOSPITAL COURSE:  [name] is a 60 year old female who has been followed by Dr. Alanda Amass and Dr. Laurene Footman.  She has a long history of coronary artery disease.  She has had multiple catheterizations.  Her last intervention was stent to the vein graft to the RCA in January 2009.  She was restudied in March 2012 and at that time the LIMA to the LAD was patent, the SVG to diagonal was patent, the SVG to the OM was patent and the SVG to the RCA was patent with a patent stent.  The plan was for medical therapy for microvascular angina at that time.  She presented to the emergency room on June 28, 2010.  She apparently had had some weakness over the 48 hours  prior to admission.  On the morning of admission, she called out to her roommate that she needed help.  When her roommate got there, she was short of breath and complaining of chest discomfort.  She was given nitroglycerin and then became unresponsive. EMS was called.  At the scene, they tried to nasally intubate her, but she awakened.  She was brought to the emergency room.  In the emergency room, her blood pressure was 80 and her white count was elevated at 20,000.  Her fever was 101.  Chest x-ray showed pulmonary edema.  She was seen by Dr. Royann Shivers and myself.  She did have some EKG changes as well with T-wave inversion in III and F as well as V4 and 6, which are new from her previous EKGs.  She was started on IV heparin and IV fluids.  She was admitted to the CCU.  We also started an IV antibiotics after cultures were obtained.  She was put on Rocephin and Zithromax. Chest x-ray showed congestive heart failure, but not necessarily pneumonia.  After  her blood pressure stabilized, we added low-dose IV nitroglycerin.  Her CKs came back positive with a CK of 295 and 16 MBs. Echocardiogram was done, which showed her EF to be 55-60% with diastolic dysfunction.  She was given Lasix for diuresis once her blood pressure stabilized.  We asked Dr. Evlyn Kanner to help with control of her diabetes, her sugar was over 500 on admission.  She was kept on heparin until July 03, 2010.  She developed epistaxis on July 03, 2010 and her heparin was discontinued.  CT scan done on July 03, 2010 showed no evidence of pulmonary emboli, although she did have some ground-glass opacities likely representing pulmonary edema.  Symptomatically, she is doing well.  On July 04, 2010, we stopped her antibiotics.  She is afebrile and her white count was down to 9.5.  She wants to go home today, although we suggested she might stay another 24 hours.  She is pretty much insisted on being discharged today and so we will go  ahead and discharge her.  Please see [facility] for complete discharge medications.  LABS AT DISCHARGE:  White count 7.5, hemoglobin 9.5, hematocrit 26.8, platelets 484.  Sodium 135, potassium 4.0, BUN 12, creatinine 0.7.  BNP was 226.  She was transfused 1 unit on July 03, 2010, packed RBCs.  CK is as noted above.  DISPOSITION:  The patient is discharged in stable condition and will follow up with Dr. Alanda Amass and Dr. Evlyn Kanner.     Abelino Derrick, P.A.   ______________________________ Pearletha Furl Alanda Amass, M.D.    Lenard Lance  D:  07/04/2010  T:  07/04/2010  Job:  [id number]  cc:   Jeannett Senior A. Evlyn Kanner, M.D.  Electronically Signed by Corine Shelter P.A. on 07/14/2010 04:35:13 PM Electronically Signed by Susa Griffins M.D. on 08/10/2010 12:46:47 PM

## 2010-08-23 ENCOUNTER — Other Ambulatory Visit: Payer: Self-pay | Admitting: Cardiovascular Disease

## 2010-08-23 DIAGNOSIS — M545 Low back pain: Secondary | ICD-10-CM

## 2010-08-30 ENCOUNTER — Other Ambulatory Visit: Payer: Medicare Other

## 2010-09-04 ENCOUNTER — Other Ambulatory Visit: Payer: Medicare Other

## 2010-09-06 ENCOUNTER — Ambulatory Visit
Admission: RE | Admit: 2010-09-06 | Discharge: 2010-09-06 | Disposition: A | Discharge status: Home / Self Care | Payer: Medicare Other | Source: Ambulatory Visit | Attending: Cardiovascular Disease | Admitting: Cardiovascular Disease

## 2010-09-06 DIAGNOSIS — M545 Low back pain: Secondary | ICD-10-CM

## 2010-11-02 ENCOUNTER — Other Ambulatory Visit: Payer: Self-pay | Admitting: Otolaryngology

## 2010-11-02 DIAGNOSIS — H905 Unspecified sensorineural hearing loss: Secondary | ICD-10-CM

## 2010-11-04 ENCOUNTER — Ambulatory Visit
Admission: RE | Admit: 2010-11-04 | Discharge: 2010-11-04 | Disposition: A | Discharge status: Home / Self Care | Payer: Medicare Other | Source: Ambulatory Visit | Attending: Otolaryngology | Admitting: Otolaryngology

## 2010-11-04 DIAGNOSIS — H905 Unspecified sensorineural hearing loss: Secondary | ICD-10-CM

## 2010-11-04 MED ORDER — GADOBENATE DIMEGLUMINE 529 MG/ML IV SOLN
13.0000 mL | Freq: Once | INTRAVENOUS | Status: AC | PRN
  Administered 2010-11-04: 13 mL via INTRAVENOUS

## 2010-12-02 LAB — CK TOTAL AND CKMB (NOT AT ARMC): CK, MB: 1.9

## 2010-12-02 LAB — TROPONIN I: Troponin I: 0.11 — ABNORMAL HIGH

## 2010-12-02 LAB — BASIC METABOLIC PANEL
BUN: 6
Chloride: 103
Glucose, Bld: 123 — ABNORMAL HIGH
Potassium: 3.5
Sodium: 138

## 2010-12-02 LAB — CARDIAC PANEL(CRET KIN+CKTOT+MB+TROPI)
Relative Index: INVALID
Troponin I: 0.13 — ABNORMAL HIGH

## 2010-12-02 LAB — CBC
HCT: 32.5 — ABNORMAL LOW
Hemoglobin: 11.1 — ABNORMAL LOW
MCV: 91.8
Platelets: 293
RDW: 12.4

## 2010-12-02 LAB — HEMOGLOBIN A1C: Hgb A1c MFr Bld: 9.1 — ABNORMAL HIGH

## 2010-12-03 LAB — DIFFERENTIAL
Basophils Absolute: 0.1
Eosinophils Relative: 1
Lymphocytes Relative: 28
Lymphs Abs: 3.1
Monocytes Absolute: 0.3
Monocytes Relative: 3
Neutro Abs: 7.6

## 2010-12-03 LAB — BASIC METABOLIC PANEL
CO2: 26
CO2: 27
Calcium: 8.7
Chloride: 100
Chloride: 99
GFR calc Af Amer: >60
Glucose, Bld: 195 — ABNORMAL HIGH
Potassium: 3.6
Sodium: 132 — ABNORMAL LOW
Sodium: 134 — ABNORMAL LOW

## 2010-12-03 LAB — COMPREHENSIVE METABOLIC PANEL
AST: 26
Albumin: 3.9
BUN: 6
Calcium: 9
Chloride: 99
Creatinine, Ser: 0.73
GFR calc Af Amer: >60
Total Protein: 6.9

## 2010-12-03 LAB — CBC
HCT: 34.6 — ABNORMAL LOW
Hemoglobin: 11.3 — ABNORMAL LOW
Hemoglobin: 11.4 — ABNORMAL LOW
MCHC: 34.3
MCV: 91.1
MCV: 91.4
Platelets: 373
RBC: 3.62 — ABNORMAL LOW
RBC: 3.64 — ABNORMAL LOW
RDW: 12.3
WBC: 11.2 — ABNORMAL HIGH
WBC: 8.6
WBC: 9.1

## 2010-12-03 LAB — POCT CARDIAC MARKERS
CKMB, poc: <1 — ABNORMAL LOW
Troponin i, poc: <0.05

## 2010-12-03 LAB — PROTIME-INR
INR: 0.9
INR: 1

## 2010-12-03 LAB — APTT: aPTT: 31

## 2010-12-03 LAB — TSH: TSH: 3.023

## 2010-12-13 HISTORY — PX: TRANSTHORACIC ECHOCARDIOGRAM: SHX275

## 2010-12-28 LAB — CREATININE, SERUM: Creatinine, Ser: 0.59

## 2011-07-30 ENCOUNTER — Emergency Department (HOSPITAL_COMMUNITY): Payer: Medicare Other

## 2011-07-30 ENCOUNTER — Encounter (HOSPITAL_COMMUNITY): Payer: Self-pay | Admitting: Neurology

## 2011-07-30 ENCOUNTER — Emergency Department (HOSPITAL_COMMUNITY)
Admission: EM | Admit: 2011-07-30 | Discharge: 2011-07-30 | Disposition: A | Discharge status: Home / Self Care | Payer: Medicare Other | Attending: Emergency Medicine | Admitting: Emergency Medicine

## 2011-07-30 DIAGNOSIS — R05 Cough: Secondary | ICD-10-CM | POA: Insufficient documentation

## 2011-07-30 DIAGNOSIS — E8779 Other fluid overload: Secondary | ICD-10-CM | POA: Insufficient documentation

## 2011-07-30 DIAGNOSIS — E119 Type 2 diabetes mellitus without complications: Secondary | ICD-10-CM | POA: Insufficient documentation

## 2011-07-30 DIAGNOSIS — R0602 Shortness of breath: Secondary | ICD-10-CM | POA: Insufficient documentation

## 2011-07-30 DIAGNOSIS — J4489 Other specified chronic obstructive pulmonary disease: Secondary | ICD-10-CM | POA: Insufficient documentation

## 2011-07-30 DIAGNOSIS — R0789 Other chest pain: Secondary | ICD-10-CM | POA: Insufficient documentation

## 2011-07-30 DIAGNOSIS — R059 Cough, unspecified: Secondary | ICD-10-CM | POA: Insufficient documentation

## 2011-07-30 DIAGNOSIS — I509 Heart failure, unspecified: Secondary | ICD-10-CM | POA: Insufficient documentation

## 2011-07-30 DIAGNOSIS — Z794 Long term (current) use of insulin: Secondary | ICD-10-CM | POA: Insufficient documentation

## 2011-07-30 DIAGNOSIS — Z79899 Other long term (current) drug therapy: Secondary | ICD-10-CM | POA: Insufficient documentation

## 2011-07-30 DIAGNOSIS — M7989 Other specified soft tissue disorders: Secondary | ICD-10-CM | POA: Insufficient documentation

## 2011-07-30 DIAGNOSIS — J449 Chronic obstructive pulmonary disease, unspecified: Secondary | ICD-10-CM | POA: Insufficient documentation

## 2011-07-30 HISTORY — DX: Unspecified hearing loss, unspecified ear: H91.90

## 2011-07-30 HISTORY — DX: Respiratory arrest: R09.2

## 2011-07-30 HISTORY — DX: Fibromyalgia: M79.7

## 2011-07-30 HISTORY — DX: Heart failure, unspecified: I50.9

## 2011-07-30 HISTORY — DX: Chronic obstructive pulmonary disease, unspecified: J44.9

## 2011-07-30 LAB — DIFFERENTIAL
Basophils Absolute: 0 10*3/uL (ref 0.0–0.1)
Basophils Relative: 0 % (ref 0–1)
Lymphocytes Relative: 22 % (ref 12–46)
Lymphs Abs: 1.9 10*3/uL (ref 0.7–4.0)
Monocytes Relative: 4 % (ref 3–12)
Neutrophils Relative %: 75 % (ref 43–77)

## 2011-07-30 LAB — PRO B NATRIURETIC PEPTIDE: Pro B Natriuretic peptide (BNP): 702.4 pg/mL — ABNORMAL HIGH (ref 0–125)

## 2011-07-30 LAB — CARDIAC PANEL(CRET KIN+CKTOT+MB+TROPI)
CK, MB: 1.9 ng/mL (ref 0.3–4.0)
Relative Index: INVALID (ref 0.0–2.5)
Total CK: 34 U/L (ref 7–177)

## 2011-07-30 LAB — CBC
Hemoglobin: 11.6 g/dL — ABNORMAL LOW (ref 12.0–15.0)
MCH: 30.8 pg (ref 26.0–34.0)
Platelets: 290 10*3/uL (ref 150–400)
RBC: 3.77 MIL/uL — ABNORMAL LOW (ref 3.87–5.11)
WBC: 8.7 10*3/uL (ref 4.0–10.5)

## 2011-07-30 LAB — URINALYSIS, ROUTINE W REFLEX MICROSCOPIC
Glucose, UA: 500 mg/dL — AB
Hgb urine dipstick: NEGATIVE
Specific Gravity, Urine: 1.008 (ref 1.005–1.030)

## 2011-07-30 LAB — COMPREHENSIVE METABOLIC PANEL
AST: 15 U/L (ref 0–37)
Albumin: 3.9 g/dL (ref 3.5–5.2)
Calcium: 9.2 mg/dL (ref 8.4–10.5)
Creatinine, Ser: 0.95 mg/dL (ref 0.50–1.10)

## 2011-07-30 MED ORDER — FUROSEMIDE 10 MG/ML IJ SOLN
40.0000 mg | Freq: Once | INTRAMUSCULAR | Status: AC
Start: 2011-07-30 — End: 2011-07-30
  Administered 2011-07-30: 40 mg via INTRAVENOUS
  Filled 2011-07-30: qty 4

## 2011-07-30 MED ORDER — NITROGLYCERIN 2 % TD OINT
TOPICAL_OINTMENT | TRANSDERMAL | Status: AC
  Filled 2011-07-30: qty 1

## 2011-07-30 MED ORDER — MORPHINE SULFATE 2 MG/ML IJ SOLN
2.0000 mg | Freq: Once | INTRAMUSCULAR | Status: AC
  Administered 2011-07-30: 2 mg via INTRAVENOUS

## 2011-07-30 MED ORDER — MORPHINE SULFATE 2 MG/ML IJ SOLN
INTRAMUSCULAR | Status: AC
  Administered 2011-07-30: 2 mg via INTRAVENOUS
  Filled 2011-07-30: qty 1

## 2011-07-30 NOTE — ED Notes (Signed)
Pt returned from xray, reporting that her "chest pressure" has started back and that she is "hurting all over" requesting pain meds at this time.  Notified Dr. Ranae Palms who ordered Nitro paste for the patient.  He then discontinued the order and gave verbal order for Morphine. Administered and will reassess

## 2011-07-30 NOTE — ED Provider Notes (Signed)
History     CSN: [id number]  Arrival date & time 07/30/11  1801   First MD Initiated Contact with Patient 07/30/11 1828      Chief Complaint  Patient presents with  . Chest Pain  . Shortness of Breath    (Consider location/radiation/quality/duration/timing/severity/associated sxs/prior treatment) HPI Pt states she woke this morning with SOB and mild chest pressure and has had recurrent episodes throughout day. Activity worsens SOB. Mild cough. No current chest pain. Mild lower ext swelling bilaterally. No fever or chills. Past Medical History  Diagnosis Date  . Respiratory arrest   . COPD (chronic obstructive pulmonary disease)   . CHF (congestive heart failure)   . Diabetes mellitus   . Fibromyalgia   . Deaf     Past Surgical History  Procedure Date  . Coronary artery bypass graft     No family history on file.  History  Substance Use Topics  . Smoking status: Current Some Day Smoker  . Smokeless tobacco: Not on file  . Alcohol Use: No    OB History    Grav Para Term Preterm Abortions TAB SAB Ect Mult Living                  Review of Systems  Constitutional: Negative for fever and chills.  Respiratory: Positive for chest tightness and shortness of breath. Negative for wheezing.   Cardiovascular: Positive for chest pain and leg swelling. Negative for palpitations.  Gastrointestinal: Negative for nausea, vomiting, abdominal pain, diarrhea and constipation.  Neurological: Negative for weakness, numbness and headaches.    Allergies  Biaxin and Demerol  Home Medications   Current Outpatient Rx  Name Route Sig Dispense Refill  . ALPRAZOLAM 1 MG PO TABS Oral Take 1 mg by mouth 3 (three) times daily. Takes every day per patient    . AMITRIPTYLINE HCL 100 MG PO TABS Oral Take 100 mg by mouth at bedtime.    [facility] AMLODIPINE BESYLATE 2.5 MG PO TABS Oral Take 2.5 mg by mouth daily.    . ASPIRIN 81 MG PO CHEW Oral Chew 81 mg by mouth 2 (two) times daily. Two a day  per patient    . BIMATOPROST 0.01 % OP SOLN Both Eyes Place 1 drop into both eyes at bedtime.    [facility] CARVEDILOL 25 MG PO TABS Oral Take 25 mg by mouth 2 (two) times daily with a meal.    . CLOPIDOGREL BISULFATE 75 MG PO TABS Oral Take 75 mg by mouth daily.    . FUROSEMIDE 40 MG PO TABS Oral Take 40 mg by mouth daily.    . INSULIN GLARGINE 100 UNIT/ML [place] SOLN Subcutaneous Inject 8-25 Units into the skin at bedtime. Patient takes 8units in the evening and 25units in the AM    . INSULIN LISPRO (HUMAN) 100 UNIT/ML [place] SOLN Subcutaneous Inject 5-12 Units into the skin 3 (three) times daily before meals. 5-12 units per sliding scale    . ISOSORBIDE MONONITRATE ER 60 MG PO TB24 Oral Take 60 mg by mouth daily.    . OXYCODONE-ACETAMINOPHEN 5-325 MG PO TABS Oral Take 1 tablet by mouth 3 (three) times daily. Patient takes every day.    Mar[facility] Kitchen PREDNISONE 20 MG PO TABS Oral Take 20 mg by mouth daily.    . SERTRALINE HCL 50 MG PO TABS Oral Take 50 mg by mouth daily.    [facility] Kitchen TIMOLOL MALEATE 0.25 % OP SOLN Both Eyes Place 1 drop into both eyes 2 (two)  times daily.    [facility] Kitchen VALSARTAN 40 MG PO TABS Oral Take 40 mg by mouth daily.      BP 114/41  Pulse 63  Temp(Src) 98.2 F (36.8 C) (Oral)  Resp 16  SpO2 98%  Physical Exam  Nursing note and vitals reviewed. Constitutional: She is oriented to person, place, and time. She appears well-developed and well-nourished. No distress.  HENT:  Head: Normocephalic and atraumatic.  Mouth/Throat: Oropharynx is clear and moist.  Eyes: EOM are normal. Pupils are equal, round, and reactive to light.  Neck: Normal range of motion. Neck supple.  Cardiovascular: Normal rate and regular rhythm.   Pulmonary/Chest: Effort normal. No respiratory distress. She has no wheezes.       Fine crackles in bilateral bases  Abdominal: Soft. Bowel sounds are normal. There is no tenderness. There is no rebound and no guarding.  Musculoskeletal: Normal range of motion. She exhibits no edema and no  tenderness.  Neurological: She is alert and oriented to person, place, and time.       No focal deficits  Skin: Skin is warm and dry. No rash noted. No erythema.  Psychiatric: She has a normal mood and affect. Her behavior is normal.    ED Course  Procedures (including critical care time)  Labs Reviewed  CBC - Abnormal; Notable for the following:    RBC 3.77 (*)    Hemoglobin 11.6 (*)    HCT 32.3 (*)    All other components within normal limits  COMPREHENSIVE METABOLIC PANEL - Abnormal; Notable for the following:    Sodium 127 (*)    Chloride 90 (*)    Glucose, Bld 262 (*)    GFR calc non Af Amer 64 (*)    GFR calc Af Amer 75 (*)    All other components within normal limits  URINALYSIS, ROUTINE W REFLEX MICROSCOPIC - Abnormal; Notable for the following:    Glucose, UA 500 (*)    All other components within normal limits  PRO B NATRIURETIC PEPTIDE - Abnormal; Notable for the following:    Pro B Natriuretic peptide (BNP) 702.4 (*)    All other components within normal limits  DIFFERENTIAL  CARDIAC PANEL(CRET KIN+CKTOT+MB+TROPI)  TROPONIN I   Dg Chest 2 View  07/30/2011  *RADIOLOGY REPORT*  Clinical Data: Chest pain and short of breath  CHEST - 2 VIEW  Comparison: 07/02/2010  Findings: Postop CABG.  Heart size is mildly enlarged.  Left coronary stent.  Linear densities in the lung bases compatible with scarring or atelectasis.  Negative for heart failure or effusion.  Negative for pneumonia.  IMPRESSION: Mild bibasilar atelectasis.  Negative for heart failure or pneumonia.  Original Report Authenticated By: Camelia Phenes, M.D.     1. Fluid overload   2. Atypical chest pain       MDM  Pt has diuresed significantly and is now feeling much better. No chest pain. Will repeat trop at 2200 and if neg and pt cont to improve, d/c home to f/u with PMD.         Loren Racer, MD 07/30/11 2317

## 2011-07-30 NOTE — ED Notes (Signed)
Per ems-pt is deaf, c/o cp, sob intermittent throughout entire day, 2 SL nitro painfree at this time. Pt 100% RA, pt has rx of respiratory arrest, felt sob requesting NRB, applied for pt comfort. 324 aspirin given PTA. EKG unremarkable. 20 L. Hand 500 cc given. 146/64, HR 72, RR 16. A x 4.

## 2011-11-13 DIAGNOSIS — Z9289 Personal history of other medical treatment: Secondary | ICD-10-CM

## 2011-11-13 HISTORY — DX: Personal history of other medical treatment: Z92.89

## 2012-04-11 ENCOUNTER — Other Ambulatory Visit (HOSPITAL_COMMUNITY): Payer: Self-pay | Admitting: Cardiovascular Disease

## 2012-04-11 DIAGNOSIS — IMO0001 Reserved for inherently not codable concepts without codable children: Secondary | ICD-10-CM

## 2012-04-11 DIAGNOSIS — I672 Cerebral atherosclerosis: Secondary | ICD-10-CM

## 2012-05-03 ENCOUNTER — Other Ambulatory Visit: Payer: Self-pay | Admitting: Neurological Surgery

## 2012-05-03 DIAGNOSIS — M542 Cervicalgia: Secondary | ICD-10-CM

## 2012-05-10 ENCOUNTER — Ambulatory Visit (HOSPITAL_COMMUNITY)
Admission: RE | Admit: 2012-05-10 | Discharge: 2012-05-10 | Disposition: A | Discharge status: Home / Self Care | Payer: Medicare Other | Source: Ambulatory Visit | Attending: Cardiovascular Disease | Admitting: Cardiovascular Disease

## 2012-05-10 DIAGNOSIS — I672 Cerebral atherosclerosis: Secondary | ICD-10-CM

## 2012-05-10 DIAGNOSIS — I70209 Unspecified atherosclerosis of native arteries of extremities, unspecified extremity: Secondary | ICD-10-CM | POA: Insufficient documentation

## 2012-05-10 DIAGNOSIS — IMO0001 Reserved for inherently not codable concepts without codable children: Secondary | ICD-10-CM

## 2012-05-10 DIAGNOSIS — I6529 Occlusion and stenosis of unspecified carotid artery: Secondary | ICD-10-CM | POA: Insufficient documentation

## 2012-05-10 DIAGNOSIS — I658 Occlusion and stenosis of other precerebral arteries: Secondary | ICD-10-CM | POA: Insufficient documentation

## 2012-05-10 DIAGNOSIS — I70219 Atherosclerosis of native arteries of extremities with intermittent claudication, unspecified extremity: Secondary | ICD-10-CM | POA: Insufficient documentation

## 2012-05-10 DIAGNOSIS — R0989 Other specified symptoms and signs involving the circulatory and respiratory systems: Secondary | ICD-10-CM | POA: Insufficient documentation

## 2012-05-10 NOTE — Progress Notes (Signed)
Bilateral Lower Ext. Arterial Doppler Completed. Chauncy Lean

## 2012-05-10 NOTE — Progress Notes (Signed)
Carotid Duplex Completed. Mandell Pangborn D  

## 2012-05-11 ENCOUNTER — Ambulatory Visit
Admission: RE | Admit: 2012-05-11 | Discharge: 2012-05-11 | Disposition: A | Discharge status: Home / Self Care | Payer: Medicare Other | Source: Ambulatory Visit | Attending: Neurological Surgery | Admitting: Neurological Surgery

## 2012-05-11 DIAGNOSIS — M542 Cervicalgia: Secondary | ICD-10-CM

## 2012-06-05 ENCOUNTER — Other Ambulatory Visit (HOSPITAL_COMMUNITY): Payer: Self-pay | Admitting: Cardiovascular Disease

## 2012-06-05 DIAGNOSIS — I509 Heart failure, unspecified: Secondary | ICD-10-CM

## 2012-07-27 ENCOUNTER — Other Ambulatory Visit: Payer: Self-pay | Admitting: Cardiovascular Disease

## 2012-07-27 NOTE — Telephone Encounter (Signed)
Returned call.  No answer/voicemail.

## 2012-07-27 NOTE — Telephone Encounter (Signed)
Need a prescription renewal on Isosorbide.. Only have enough left until Monday .[phone]

## 2012-07-30 ENCOUNTER — Other Ambulatory Visit: Payer: Self-pay | Admitting: *Deleted

## 2012-07-30 NOTE — Telephone Encounter (Signed)
Isosorbide [place] 60mg  BID called into [id number] x 6 refills

## 2012-08-03 ENCOUNTER — Other Ambulatory Visit: Payer: Self-pay | Admitting: *Deleted

## 2012-08-03 MED ORDER — FUROSEMIDE 40 MG PO TABS: 40.0000 mg | ORAL_TABLET | Freq: Every day | ORAL | Status: DC

## 2012-08-03 NOTE — Telephone Encounter (Signed)
Refills sent to pharmacy. 

## 2012-08-08 MED ORDER — ISOSORBIDE MONONITRATE ER 60 MG PO TB24: 60.0000 mg | ORAL_TABLET | Freq: Every day | ORAL | Status: DC

## 2012-08-08 NOTE — Telephone Encounter (Signed)
Refill sent in [employer] on 5.16.14.

## 2012-08-21 ENCOUNTER — Ambulatory Visit (HOSPITAL_COMMUNITY): Payer: Medicare Other

## 2012-08-30 ENCOUNTER — Telehealth: Payer: Self-pay | Admitting: Cardiovascular Disease

## 2012-08-30 NOTE — Telephone Encounter (Signed)
Question about directions-

## 2012-09-27 ENCOUNTER — Telehealth: Payer: Self-pay | Admitting: Cardiovascular Disease

## 2012-10-09 ENCOUNTER — Observation Stay (HOSPITAL_BASED_OUTPATIENT_CLINIC_OR_DEPARTMENT_OTHER)
Admission: EM | Admit: 2012-10-09 | Discharge: 2012-10-10 | Disposition: A | Discharge status: Home / Self Care | Payer: Medicare Other | Attending: Internal Medicine | Admitting: Internal Medicine

## 2012-10-09 ENCOUNTER — Emergency Department (HOSPITAL_BASED_OUTPATIENT_CLINIC_OR_DEPARTMENT_OTHER): Payer: Medicare Other

## 2012-10-09 ENCOUNTER — Encounter (HOSPITAL_BASED_OUTPATIENT_CLINIC_OR_DEPARTMENT_OTHER): Payer: Self-pay | Admitting: *Deleted

## 2012-10-09 DIAGNOSIS — I10 Essential (primary) hypertension: Secondary | ICD-10-CM | POA: Insufficient documentation

## 2012-10-09 DIAGNOSIS — E119 Type 2 diabetes mellitus without complications: Secondary | ICD-10-CM | POA: Insufficient documentation

## 2012-10-09 DIAGNOSIS — S2239XA Fracture of one rib, unspecified side, initial encounter for closed fracture: Principal | ICD-10-CM | POA: Insufficient documentation

## 2012-10-09 DIAGNOSIS — Y92009 Unspecified place in unspecified non-institutional (private) residence as the place of occurrence of the external cause: Secondary | ICD-10-CM | POA: Insufficient documentation

## 2012-10-09 DIAGNOSIS — G8929 Other chronic pain: Secondary | ICD-10-CM | POA: Insufficient documentation

## 2012-10-09 DIAGNOSIS — M353 Polymyalgia rheumatica: Secondary | ICD-10-CM | POA: Insufficient documentation

## 2012-10-09 DIAGNOSIS — I509 Heart failure, unspecified: Secondary | ICD-10-CM | POA: Insufficient documentation

## 2012-10-09 DIAGNOSIS — R42 Dizziness and giddiness: Secondary | ICD-10-CM | POA: Insufficient documentation

## 2012-10-09 DIAGNOSIS — J449 Chronic obstructive pulmonary disease, unspecified: Secondary | ICD-10-CM | POA: Insufficient documentation

## 2012-10-09 DIAGNOSIS — R0789 Other chest pain: Secondary | ICD-10-CM | POA: Insufficient documentation

## 2012-10-09 DIAGNOSIS — J4489 Other specified chronic obstructive pulmonary disease: Secondary | ICD-10-CM | POA: Insufficient documentation

## 2012-10-09 DIAGNOSIS — W19XXXA Unspecified fall, initial encounter: Secondary | ICD-10-CM | POA: Insufficient documentation

## 2012-10-09 DIAGNOSIS — R58 Hemorrhage, not elsewhere classified: Secondary | ICD-10-CM

## 2012-10-09 DIAGNOSIS — R079 Chest pain, unspecified: Secondary | ICD-10-CM

## 2012-10-09 DIAGNOSIS — Z794 Long term (current) use of insulin: Secondary | ICD-10-CM | POA: Insufficient documentation

## 2012-10-09 DIAGNOSIS — Z7902 Long term (current) use of antithrombotics/antiplatelets: Secondary | ICD-10-CM | POA: Insufficient documentation

## 2012-10-09 DIAGNOSIS — Z79899 Other long term (current) drug therapy: Secondary | ICD-10-CM | POA: Insufficient documentation

## 2012-10-09 LAB — COMPREHENSIVE METABOLIC PANEL
Alkaline Phosphatase: 96 U/L (ref 39–117)
BUN: 19 mg/dL (ref 6–23)
Calcium: 10 mg/dL (ref 8.4–10.5)
Creatinine, Ser: 0.7 mg/dL (ref 0.50–1.10)
GFR calc Af Amer: >90 mL/min (ref >90)
Glucose, Bld: 142 mg/dL — ABNORMAL HIGH (ref 70–99)
Potassium: 3.8 mEq/L (ref 3.5–5.1)
Total Protein: 7.3 g/dL (ref 6.0–8.3)

## 2012-10-09 LAB — CBC WITH DIFFERENTIAL/PLATELET
Basophils Relative: 0 % (ref 0–1)
Eosinophils Absolute: 0.2 10*3/uL (ref 0.0–0.7)
Eosinophils Relative: 2 % (ref 0–5)
HCT: 32.1 % — ABNORMAL LOW (ref 36.0–46.0)
Hemoglobin: 11.4 g/dL — ABNORMAL LOW (ref 12.0–15.0)
Lymphs Abs: 3.5 10*3/uL (ref 0.7–4.0)
MCH: 31.1 pg (ref 26.0–34.0)
MCHC: 35.5 g/dL (ref 30.0–36.0)
MCV: 87.7 fL (ref 78.0–100.0)
Monocytes Absolute: 0.7 10*3/uL (ref 0.1–1.0)
Monocytes Relative: 6 % (ref 3–12)
RBC: 3.66 MIL/uL — ABNORMAL LOW (ref 3.87–5.11)

## 2012-10-09 LAB — GLUCOSE, CAPILLARY
Glucose-Capillary: 37 mg/dL — CL (ref 70–99)
Glucose-Capillary: 156 mg/dL — ABNORMAL HIGH (ref 70–99)

## 2012-10-09 LAB — URINALYSIS, ROUTINE W REFLEX MICROSCOPIC
Bilirubin Urine: NEGATIVE
Leukocytes, UA: NEGATIVE
Nitrite: NEGATIVE
Specific Gravity, Urine: 1.005 (ref 1.005–1.030)
pH: 6.5 (ref 5.0–8.0)

## 2012-10-09 LAB — TROPONIN I
Troponin I: <0.3 ng/mL (ref <0.30)
Troponin I: <0.3 ng/mL (ref <0.30)

## 2012-10-09 LAB — APTT: aPTT: 35 seconds (ref 24–37)

## 2012-10-09 MED ORDER — TIMOLOL MALEATE 0.25 % OP SOLN
1.0000 [drp] | Freq: Two times a day (BID) | OPHTHALMIC | Status: DC
  Administered 2012-10-10: 1 [drp] via OPHTHALMIC
  Filled 2012-10-09: qty 5

## 2012-10-09 MED ORDER — AMITRIPTYLINE HCL 100 MG PO TABS
100.0000 mg | ORAL_TABLET | Freq: Every day | ORAL | Status: DC
Start: 2012-10-09 — End: 2012-10-10
  Administered 2012-10-09: 100 mg via ORAL
  Filled 2012-10-09 (×2): qty 1

## 2012-10-09 MED ORDER — PREDNISONE 20 MG PO TABS
20.0000 mg | ORAL_TABLET | Freq: Every day | ORAL | Status: DC
  Administered 2012-10-10: 20 mg via ORAL
  Filled 2012-10-09 (×2): qty 1

## 2012-10-09 MED ORDER — NITROGLYCERIN 0.4 MG SL SUBL
0.4000 mg | SUBLINGUAL_TABLET | SUBLINGUAL | Status: DC | PRN
  Filled 2012-10-09: qty 25

## 2012-10-09 MED ORDER — OXYCODONE-ACETAMINOPHEN 5-325 MG PO TABS
1.0000 | ORAL_TABLET | Freq: Three times a day (TID) | ORAL | Status: DC
  Administered 2012-10-10: 1 via ORAL
  Filled 2012-10-09: qty 1

## 2012-10-09 MED ORDER — CLOPIDOGREL BISULFATE 75 MG PO TABS
75.0000 mg | ORAL_TABLET | Freq: Every day | ORAL | Status: DC
  Filled 2012-10-09: qty 1

## 2012-10-09 MED ORDER — ALPRAZOLAM 0.5 MG PO TABS
1.0000 mg | ORAL_TABLET | Freq: Three times a day (TID) | ORAL | Status: DC
  Administered 2012-10-09 – 2012-10-10 (×2): 1 mg via ORAL
  Filled 2012-10-09: qty 2
  Filled 2012-10-09 (×2): qty 1

## 2012-10-09 MED ORDER — BIMATOPROST 0.01 % OP SOLN
1.0000 [drp] | Freq: Every day | OPHTHALMIC | Status: DC
  Administered 2012-10-09: 1 [drp] via OPHTHALMIC
  Filled 2012-10-09: qty 2.5

## 2012-10-09 MED ORDER — OXYCODONE-ACETAMINOPHEN 5-325 MG PO TABS
ORAL_TABLET | ORAL | Status: AC
  Filled 2012-10-09: qty 1

## 2012-10-09 MED ORDER — INSULIN GLARGINE 100 UNIT/ML ~~LOC~~ SOLN
8.0000 [IU] | Freq: Every day | SUBCUTANEOUS | Status: DC
  Filled 2012-10-09 (×2): qty 0.08

## 2012-10-09 MED ORDER — IOHEXOL 300 MG/ML  SOLN
100.0000 mL | Freq: Once | INTRAMUSCULAR | Status: AC | PRN
  Administered 2012-10-09: 100 mL via INTRAVENOUS

## 2012-10-09 MED ORDER — MORPHINE SULFATE 2 MG/ML IJ SOLN
1.0000 mg | INTRAMUSCULAR | Status: DC | PRN
  Administered 2012-10-10: 1 mg via INTRAVENOUS
  Filled 2012-10-09: qty 1

## 2012-10-09 MED ORDER — INSULIN ASPART 100 UNIT/ML ~~LOC~~ SOLN: 0.0000 [IU] | Freq: Three times a day (TID) | SUBCUTANEOUS | Status: DC

## 2012-10-09 MED ORDER — SODIUM CHLORIDE 0.9 % IV SOLN: 20.0000 mL | INTRAVENOUS | Status: DC

## 2012-10-09 MED ORDER — SERTRALINE HCL 50 MG PO TABS
50.0000 mg | ORAL_TABLET | Freq: Every day | ORAL | Status: DC
  Administered 2012-10-10: 50 mg via ORAL
  Filled 2012-10-09 (×2): qty 1

## 2012-10-09 MED ORDER — SODIUM CHLORIDE 0.9 % IJ SOLN
3.0000 mL | Freq: Two times a day (BID) | INTRAMUSCULAR | Status: DC
  Administered 2012-10-09: 3 mL via INTRAVENOUS

## 2012-10-09 MED ORDER — OXYCODONE-ACETAMINOPHEN 5-325 MG PO TABS
1.0000 | ORAL_TABLET | Freq: Once | ORAL | Status: AC
  Administered 2012-10-09: 1 via ORAL
  Filled 2012-10-09: qty 1

## 2012-10-09 MED ORDER — AMLODIPINE BESYLATE 2.5 MG PO TABS
2.5000 mg | ORAL_TABLET | Freq: Every day | ORAL | Status: DC
  Administered 2012-10-10: 2.5 mg via ORAL
  Filled 2012-10-09 (×2): qty 1

## 2012-10-09 MED ORDER — ASPIRIN 81 MG PO CHEW: 324.0000 mg | CHEWABLE_TABLET | Freq: Once | ORAL | Status: DC

## 2012-10-09 MED ORDER — ASPIRIN 81 MG PO CHEW
81.0000 mg | CHEWABLE_TABLET | Freq: Two times a day (BID) | ORAL | Status: DC
  Administered 2012-10-10: 81 mg via ORAL
  Filled 2012-10-09 (×3): qty 1

## 2012-10-09 MED ORDER — ISOSORBIDE MONONITRATE ER 60 MG PO TB24
60.0000 mg | ORAL_TABLET | Freq: Every day | ORAL | Status: DC
  Administered 2012-10-09 – 2012-10-10 (×2): 60 mg via ORAL
  Filled 2012-10-09 (×2): qty 1

## 2012-10-09 MED ORDER — INSULIN GLARGINE 100 UNIT/ML ~~LOC~~ SOLN: 8.0000 [IU] | Freq: Every day | SUBCUTANEOUS | Status: DC

## 2012-10-09 MED ORDER — IRBESARTAN 75 MG PO TABS
75.0000 mg | ORAL_TABLET | Freq: Every day | ORAL | Status: DC
  Administered 2012-10-09 – 2012-10-10 (×2): 75 mg via ORAL
  Filled 2012-10-09 (×2): qty 1

## 2012-10-09 MED ORDER — INSULIN ASPART 100 UNIT/ML ~~LOC~~ SOLN: 3.0000 [IU] | Freq: Three times a day (TID) | SUBCUTANEOUS | Status: DC

## 2012-10-09 MED ORDER — CARVEDILOL 25 MG PO TABS
25.0000 mg | ORAL_TABLET | Freq: Two times a day (BID) | ORAL | Status: DC
  Administered 2012-10-10: 25 mg via ORAL
  Filled 2012-10-09 (×3): qty 1

## 2012-10-09 MED ORDER — SODIUM CHLORIDE 0.9 % IV SOLN
20.0000 mL | INTRAVENOUS | Status: DC
  Administered 2012-10-09: 20 mL via INTRAVENOUS

## 2012-10-09 NOTE — ED Notes (Signed)
BS 37 pt given coke and grahm crackers by request

## 2012-10-09 NOTE — ED Notes (Signed)
Report given to [facility]

## 2012-10-09 NOTE — H&P (Addendum)
[facility] History and Physical  [name] ZOX:[id number] DOB: 1950/11/09 DOA: 10/09/2012  Referring physician: [facility] PCP: Fredirick Maudlin, MD  Specialists: none  Chief Complaint: CP  HPI: [name] is a 62 y.o. female with known extensive history of cardiac disease, polymyalgia rheumatica, musical air syndrome, multiple issues recent falls, most recently 4 days ago and developed intractable bleedingfrom her leg occurring after she fell on her porch, 2/2 to a splinter which she decided to remove today at home. She removed the splint and noticed profuse bleeding, probably secondary to Plavix, and aspirin, that she is on- She was in the car getting over here, she complained of chest pain. She has had chest pain a couple of days ago , and  last night. Had to take 2 nitroglycerin and 6 Aspirin for this. This is a normal occurrence happens about once a month. She denies any nausea or vomiting, blurred vision, double vision, rash, has chronic pain in her neck, back, and lower extremities, no melena. No fever no chills. No cough no cold no blurred vision, no double vision. No weakness on any one side body, no stroke like symptoms  Labs were normal, glucose 142, old it was noted to be a glucose of 37. This morning. Troponin 0.30 WBC 10.9, hemoglobin of 0.4 CT head negative CT abdomen pelvis negative Chest x-ray two-view no acute cardiopulmonary disease.  Possible left 5th rib fracture  Review of Systems: The patient denies 5 point date. Review of systems as above  Past Medical History  Diagnosis Date  . Respiratory arrest   . COPD (chronic obstructive pulmonary disease)   . CHF (congestive heart failure)   . Diabetes mellitus   . Fibromyalgia   . Deaf    Admission 04/20/07 Ho UA, CAd s/p PCI 03/2007, CABG x 5 1995 Admission 03/23/06 chronic angina Admission 04/10/07 angina Admission 09/15/04 angina Admission 12/03/02 Stent placed Admission 09/07/00   Past Surgical History  Procedure  Laterality Date  . Coronary artery bypass graft     Social History:  reports that she has been smoking.  She does not have any smokeless tobacco history on file. She reports that she does not drink alcohol or use illicit drugs.  Allergies  Allergen Reactions  . Biaxin (Clarithromycin)     rash  . Codeine   . Demerol (Meperidine)     Rash   . Flagyl (Metronidazole)     History reviewed. No pertinent family history. unabel to get from patient  Prior to Admission medications   Medication Sig Start Date End Date Taking? Authorizing Provider  ALPRAZolam Prudy Feeler) 1 MG tablet Take 1 mg by mouth 3 (three) times daily. Takes every day per patient    Historical Provider, MD  amitriptyline (ELAVIL) 100 MG tablet Take 100 mg by mouth at bedtime.    Historical Provider, MD  amLODipine (NORVASC) 2.5 MG tablet Take 2.5 mg by mouth daily.    Historical Provider, MD  aspirin 81 MG chewable tablet Chew 81 mg by mouth 2 (two) times daily. Two a day per patient    Historical Provider, MD  bimatoprost (LUMIGAN) 0.01 % SOLN Place 1 drop into both eyes at bedtime.    Historical Provider, MD  carvedilol (COREG) 25 MG tablet Take 25 mg by mouth 2 (two) times daily with a meal.    Historical Provider, MD  clopidogrel (PLAVIX) 75 MG tablet Take 75 mg by mouth daily.    Historical Provider, MD  furosemide (LASIX) 40 MG  tablet Take 1 tablet (40 mg total) by mouth daily. Or as directed 08/03/12   Governor Rooks, MD  insulin glargine (LANTUS) 100 UNIT/ML injection Inject 8-25 Units into the skin at bedtime. Patient takes 8units in the evening and 25units in the AM    Historical Provider, MD  insulin lispro (HUMALOG) 100 UNIT/ML injection Inject 5-12 Units into the skin 3 (three) times daily before meals. 5-12 units per sliding scale    Historical Provider, MD  isosorbide mononitrate (IMDUR) 60 MG 24 hr tablet Take 1 tablet (60 mg total) by mouth daily. 07/27/12   Governor Rooks, MD  oxyCODONE-acetaminophen  (PERCOCET) 5-325 MG per tablet Take 1 tablet by mouth 3 (three) times daily. Patient takes every day.    Historical Provider, MD  predniSONE (DELTASONE) 20 MG tablet Take 20 mg by mouth daily.    Historical Provider, MD  sertraline (ZOLOFT) 50 MG tablet Take 50 mg by mouth daily.    Historical Provider, MD  timolol (TIMOPTIC) 0.25 % ophthalmic solution Place 1 drop into both eyes 2 (two) times daily.    Historical Provider, MD  valsartan (DIOVAN) 40 MG tablet Take 40 mg by mouth daily.    Historical Provider, MD   Physical Exam: Filed Vitals:   10/09/12 1351 10/09/12 1552 10/09/12 1723 10/09/12 1933  BP: 147/58 116/53 111/55 92/59  Pulse: 67 65 65 66  Temp: 98.5 F (36.9 C)   97.5 F (36.4 C)  TempSrc: Oral   Oral  Resp: 20   20  Height:    5\' 4"  (1.626 m)  Weight:    69.083 kg (152 lb 4.8 oz)  SpO2: 99% 99%  100%     General:  Alert, pleasant, oriented no apparent distress-very hard of hearing and difficult tom communicate w her  Eyes: EOMI  ENT: Soft supple, no lymphadenopathy  Neck: No JVD, no bruit  Cardiovascular: S1, S2 no murmur, rub, or gallop  Respiratory: Clinically clear  Abdomen: Soft, nontender, nondistended  Skin: Ecchymosis to left upper arm, posterior left thigh  Musculoskeletal: Pain on palpation directly over chest wall  Psychiatric: Euthymic  Neurologic: Grossly intact  Labs on Admission:  Basic Metabolic Panel:  Recent Labs Lab 10/09/12 1410  NA 134*  K 3.8  CL 94*  CO2 29  GLUCOSE 142*  BUN 19  CREATININE 0.70  CALCIUM 10.0   Liver Function Tests:  Recent Labs Lab 10/09/12 1410  AST 21  ALT 12  ALKPHOS 96  BILITOT 0.4  PROT 7.3  ALBUMIN 3.7   No results found for this basename: LIPASE, AMYLASE,  in the last 168 hours No results found for this basename: AMMONIA,  in the last 168 hours CBC:  Recent Labs Lab 10/09/12 1410  WBC 10.9*  NEUTROABS 6.5  HGB 11.4*  HCT 32.1*  MCV 87.7  PLT 357   Cardiac  Enzymes:  Recent Labs Lab 10/09/12 1410  TROPONINI <0.30    BNP (last 3 results) No results found for this basename: PROBNP,  in the last 8760 hours CBG:  Recent Labs Lab 10/09/12 1633 10/09/12 1724  GLUCAP 37* 135*    Radiological Exams on Admission: Dg Chest 2 View  10/09/2012   *RADIOLOGY REPORT*  Clinical Data: Fall today.  Chest pain.  CHEST - 2 VIEW  Comparison: Two-view chest x-ray yesterday [facility] and 07/30/2011 [place].  Findings: Prior sternotomy for CABG.  Cardiac silhouette normal in size.  Thoracic aorta mildly atherosclerotic, unchanged.  Hilar and  mediastinal contours otherwise unremarkable.  Lungs clear. Bronchovascular markings normal.  Pulmonary vascularity normal.  No pneumothorax.  No pleural effusions. Possible mild deformity involving the mid lateral left ribs, in particular the lateral 5th rib.  Visualized bony thorax intact otherwise.  IMPRESSION: No acute cardiopulmonary disease. Possible left lateral 5th rib fracture.  If this corresponds to the site of pain and tenderness, dedicated left rib imaging may be confirmatory.   Original Report Authenticated By: Hulan Saas, M.D.   Ct Head Wo Contrast  10/09/2012   *RADIOLOGY REPORT*  Clinical Data: Fall 3 days ago, headache  CT HEAD WITHOUT CONTRAST  Technique:  Contiguous axial images were obtained from the base of the skull through the vertex without contrast.  Comparison: MRI 11/04/2010  Findings: Mild frontal atrophy.  Negative for hydrocephalus.  Mild chronic microvascular ischemic changes in the white matter.  No acute infarct.  Negative for hemorrhage mass or skull fracture.  IMPRESSION: No acute abnormality.   Original Report Authenticated By: Janeece Riggers, M.D.   Ct Abdomen Pelvis W Contrast  10/09/2012   *RADIOLOGY REPORT*  Clinical Data: Fall, pain, upper abdominal ecchymosis  CT ABDOMEN AND PELVIS WITH CONTRAST  Technique:  Multidetector CT imaging of the abdomen and pelvis was  performed following the standard protocol during bolus administration of intravenous contrast.  Contrast: OMNIPAQUE IOHEXOL 300 MG/ML  SOLN  Comparison: 06/04/2012 CT  Findings: Evidence of median sternotomy partly visualized.  Lung bases demonstrate minimal dependent bibasilar atelectasis.  Calcified hepatic granuloma noted.  Liver, gallbladder, adrenal glands, kidneys, spleen, and pancreas are unremarkable.  Moderate atheromatous aortic calcification noted without aneurysm.  Anterior abdominal wall subcutaneous stranding could represent injection sites and or ecchymosis, correlate clinically.  No free air, free fluid, or lymphadenopathy.  Uterus and adnexa are normal. No bowel wall thickening or focal segmental dilatation.  The bladder is normal.  No acute osseous abnormality.  IMPRESSION: No acute intra-abdominal or pelvic pathology.  Subcutaneous fluid collections/stranding that could represent injection sites or ecchymosis, correlate clinically.   Original Report Authenticated By: Christiana Pellant, M.D.    EKG: Independently reviewed. Normal sinus rhythm, no ST-T wave changes  Assessment/Plan Active Problems:   * No active hospital problems. *   1. Chest pain, likely not to be cardiogenic given 5th L rib #-patient states that Dr. Alanda Amass comes to see her in hospital personally I have informed Dr. Tresa Endo. Patient's admission and although I do not think chest pain is cardiogenic, she probably does have angina,stage III, which is contributing to this and I have informally asked [facility] to look in on her tomorrow.  I suspect she should be able to be d/c if enzymes neg . 2. CAD, history-see above. 3. Polymyalgia rheumatica-continue prednisone 20 daily. 4. Hypertension continue amlodipine 2.5 daily, Coreg 25 twice a day, M.D. oh 60 daily, valsartan 40 daily  5. CHF? Euvolemic although seems to have been slightly above her baseline weight of 157 recently-continue IV fluids at 50 cc patient insists that  she receive contrast for kidneys. Will give saline 50 cc/hr-hold Lasix for this concern as well. This may need to be reimplemented in the morning-patient's friend requests an echocardiogram as she was to see Dr. Alanda Amass in 2 weeks. I have informed her that I will await cardiologist's decision for this as she is euvolemic at present and would await enzymes-friend seemed to understand 6. Diabetes mellitus with hypoglycemia-start a regular diet. Hypoglycemic protocol. We'll hold her mealtime insulin and just give her her long-acting  and start sliding scale coverage-based on her readings. Her insulin may need to be readjusted  7.  chronic pain continue fentanyl patch every 3 daily 25 mcg, oxycodone 5/325 3 times a day  Informally consult cardiology-we will rule her out and she may be able to go home. If her lower strength bleeding stops   Code Status: Full Family Communication: Friend at bedside  Disposition Plan: inpt, tele  Time spent: 74  Mahala Menghini [facility] Pager [phone]  If 7PM-7AM, please contact night-coverage [email or web address] Password [facility] 10/09/2012, 8:05 PM

## 2012-10-09 NOTE — ED Notes (Signed)
Message left for [name] pts friend

## 2012-10-09 NOTE — ED Notes (Signed)
Pt fell Saturday pt found a sliver of wood in right lower leg pt is on plavix and aspirin and bled a lot then she started having some pain in her chest

## 2012-10-09 NOTE — ED Provider Notes (Signed)
CSN: [id number]     Arrival date & time 10/09/12  1340 History     First MD Initiated Contact with Patient 10/09/12 1402    Level V caveat patient is deaf Chief Complaint  Patient presents with  . Chest Pain   (Consider location/radiation/quality/duration/timing/severity/associated sxs/prior Treatment) Patient is a 62 y.o. female presenting with chest pain.  Chest Pain  This is a 62 year old female with insulin-dependent diabetes who comes in today complaining of chest pain that began last night. She had an episode of chest pain before going to bed last night and took 6 nitroglycerin and baby aspirin. The pain resolved and she is able to sleep through the night without difficulty. This morning she was pulling a splinter out of her lower leg and blood began spurting from it. She then began having chest pain again. She did not take anything for it. It is substernal in nature. It is pressure-like. It is nonradiating. The pain is 5/10 on my initial evaluation. She has a known history of coronary artery disease and has had a coronary artery bypass graft. Her cardiologist is Dr. Alanda Amass. Patient states that she fell on Saturday. It is unclear exactly what happened. She states that she injured her left chest wall and is having some abdominal pain from this. She states the pain in her left anterolateral chest wall is separate from the substernal chest pain that she's been having. She has not been dyspneic. She has a laceration of the right lower extremity which she states she removed would from this morning. Her tetanus is up to date. Past Medical History  Diagnosis Date  . Respiratory arrest   . COPD (chronic obstructive pulmonary disease)   . CHF (congestive heart failure)   . Diabetes mellitus   . Fibromyalgia   . Deaf    Past Surgical History  Procedure Laterality Date  . Coronary artery bypass graft     History reviewed. No pertinent family history. History  Substance Use Topics  .  Smoking status: Current Some Day Smoker  . Smokeless tobacco: Not on file  . Alcohol Use: No   OB History   Grav Para Term Preterm Abortions TAB SAB Ect Mult Living                 Review of Systems  Cardiovascular: Positive for chest pain. Negative for leg swelling.  All other systems reviewed and are negative.    Allergies  Biaxin; Codeine; Demerol; and Flagyl  Home Medications   Current Outpatient Rx  Name  Route  Sig  Dispense  Refill  . ALPRAZolam (XANAX) 1 MG tablet   Oral   Take 1 mg by mouth 3 (three) times daily. Takes every day per patient         . amitriptyline (ELAVIL) 100 MG tablet   Oral   Take 100 mg by mouth at bedtime.         Marland Kitchen amLODipine (NORVASC) 2.5 MG tablet   Oral   Take 2.5 mg by mouth daily.         Marland Kitchen aspirin 81 MG chewable tablet   Oral   Chew 81 mg by mouth 2 (two) times daily. Two a day per patient         . bimatoprost (LUMIGAN) 0.01 % SOLN   Both Eyes   Place 1 drop into both eyes at bedtime.         . carvedilol (COREG) 25 MG tablet   Oral  Take 25 mg by mouth 2 (two) times daily with a meal.         . clopidogrel (PLAVIX) 75 MG tablet   Oral   Take 75 mg by mouth daily.         . furosemide (LASIX) 40 MG tablet   Oral   Take 1 tablet (40 mg total) by mouth daily. Or as directed   60 tablet   10   . insulin glargine (LANTUS) 100 UNIT/ML injection   Subcutaneous   Inject 8-25 Units into the skin at bedtime. Patient takes 8units in the evening and 25units in the AM         . insulin lispro (HUMALOG) 100 UNIT/ML injection   Subcutaneous   Inject 5-12 Units into the skin 3 (three) times daily before meals. 5-12 units per sliding scale         . isosorbide mononitrate (IMDUR) 60 MG 24 hr tablet   Oral   Take 1 tablet (60 mg total) by mouth daily.           [employer]   . oxyCODONE-acetaminophen (PERCOCET) 5-325 MG per tablet   Oral   Take 1 tablet by mouth 3 (three) times daily. Patient takes every  day.         . predniSONE (DELTASONE) 20 MG tablet   Oral   Take 20 mg by mouth daily.         . sertraline (ZOLOFT) 50 MG tablet   Oral   Take 50 mg by mouth daily.         . timolol (TIMOPTIC) 0.25 % ophthalmic solution   Both Eyes   Place 1 drop into both eyes 2 (two) times daily.         . valsartan (DIOVAN) 40 MG tablet   Oral   Take 40 mg by mouth daily.          BP 147/58  Pulse 67  Temp(Src) 98.5 F (36.9 C) (Oral)  Resp 20  SpO2 99% Physical Exam  Nursing note and vitals reviewed. Constitutional: She is oriented to person, place, and time. She appears well-developed and well-nourished.  HENT:  Head: Normocephalic and atraumatic.  Right Ear: External ear normal.  Left Ear: External ear normal.  Nose: Nose normal.  Mouth/Throat: Oropharynx is clear and moist.  Eyes: Conjunctivae and EOM are normal. Pupils are equal, round, and reactive to light.  Neck: Normal range of motion. Neck supple.  Cardiovascular: Normal rate, regular rhythm, normal heart sounds and intact distal pulses.   Pulmonary/Chest: Effort normal and breath sounds normal.  Abdominal: Soft. Bowel sounds are normal.  Musculoskeletal: Normal range of motion.  Puncture wound right lateral lower mid lower leg.  Neurological: She is alert and oriented to person, place, and time. She has normal reflexes.  Skin: Skin is warm and dry.  Psychiatric: She has a normal mood and affect. Her behavior is normal. Thought content normal.    ED Course   Procedures (including critical care time)  Labs Reviewed  PROTIME-INR  APTT  URINALYSIS, ROUTINE W REFLEX MICROSCOPIC  CBC WITH DIFFERENTIAL  COMPREHENSIVE METABOLIC PANEL  TROPONIN I   No results found. No diagnosis found.  MDM   Date: 10/09/2012  Rate: 67  Rhythm: normal sinus rhythm  QRS Axis: normal  Intervals: normal  ST/T Wave abnormalities: nonspecific ST/T changes  Conduction Disutrbances:none  Narrative Interpretation:   Old  EKG Reviewed: unchanged    1- chest pain- patient with unstable  angina began last night.  Pain free now.   2- fall- left rib fracture.   Discussed with Dr. Suanne Marker and plan transfer to tele for rule out.   Hilario Quarry, MD 10/09/12 [phone]

## 2012-10-10 DIAGNOSIS — R079 Chest pain, unspecified: Secondary | ICD-10-CM

## 2012-10-10 DIAGNOSIS — R58 Hemorrhage, not elsewhere classified: Secondary | ICD-10-CM

## 2012-10-10 LAB — COMPREHENSIVE METABOLIC PANEL
ALT: 11 U/L (ref 0–35)
AST: 15 U/L (ref 0–37)
Alkaline Phosphatase: 79 U/L (ref 39–117)
CO2: 30 mEq/L (ref 19–32)
Chloride: 96 mEq/L (ref 96–112)
GFR calc non Af Amer: >90 mL/min (ref >90)
Potassium: 3.5 mEq/L (ref 3.5–5.1)
Sodium: 134 mEq/L — ABNORMAL LOW (ref 135–145)
Total Bilirubin: 0.3 mg/dL (ref 0.3–1.2)

## 2012-10-10 LAB — CBC
Hemoglobin: 10.5 g/dL — ABNORMAL LOW (ref 12.0–15.0)
MCH: 31.2 pg (ref 26.0–34.0)
MCHC: 36.1 g/dL — ABNORMAL HIGH (ref 30.0–36.0)
Platelets: 303 10*3/uL (ref 150–400)

## 2012-10-10 LAB — GLUCOSE, CAPILLARY: Glucose-Capillary: 73 mg/dL (ref 70–99)

## 2012-10-10 LAB — PROTIME-INR: INR: 1.06 (ref 0.00–1.49)

## 2012-10-10 LAB — TROPONIN I: Troponin I: <0.3 ng/mL (ref <0.30)

## 2012-10-10 MED ORDER — MECLIZINE HCL 12.5 MG PO TABS: 12.5000 mg | ORAL_TABLET | Freq: Three times a day (TID) | ORAL | Status: DC | PRN

## 2012-10-10 MED ORDER — ISOSORBIDE MONONITRATE ER 60 MG PO TB24: 60.0000 mg | ORAL_TABLET | Freq: Every day | ORAL | Status: DC

## 2012-10-10 MED ORDER — MECLIZINE HCL 12.5 MG PO TABS
12.5000 mg | ORAL_TABLET | Freq: Two times a day (BID) | ORAL | Status: DC
  Administered 2012-10-10: 12.5 mg via ORAL
  Filled 2012-10-10 (×2): qty 1

## 2012-10-10 MED ORDER — INSULIN GLARGINE 100 UNIT/ML ~~LOC~~ SOLN: 8.0000 [IU] | Freq: Every day | SUBCUTANEOUS | Status: DC

## 2012-10-10 NOTE — Progress Notes (Signed)
Utilization review completed.  

## 2012-10-10 NOTE — Progress Notes (Signed)
Brief Cardiology Note  Dr. Mahala Menghini called to let us ([facility]) know Dr. Alanda Amass knows Ms. [name] well and she asked to see him personally. She's here with recurrent falls and leading diagnosis now is potential left rib fracture. The admitting provider did not want a formal consult but wanted Dr. Alanda Amass to know she was here and for me to pass this information along to him and his team because he would want to stop by personally and saw hello. I was seeing another patient adjacent to her room and I stopped in and said hello. She tells me she's not been having any chest pain but dealing with balance issues related to her inner ear leading to multiple falls with some bruising especially on her left upper arm. I briefly examined her, vitals reviewed; BP 92/59  Pulse 66  Temp(Src) 97.5 F (36.4 C) (Oral)  Resp 20  Ht 5\' 4"  (1.626 m)  Wt 69.083 kg (152 lb 4.8 oz)  BMI 26.13 kg/m2  SpO2 100%ECG with some slight ST nonspecific changes not unlike previous ECGs. Heart regular rhythm, no overt murmur/rub/gallop, occasional PVCs, pleasant, alert, oriented, bruising noted on left arm, extremities warm, abd soft, ND/NT, Lungs fairly CTAB. Cardiac enyzmes unrevealing. I will pass sign out this information to Dr. Kandis Cocking team.   Leeann Must, MD

## 2012-10-10 NOTE — Discharge Summary (Signed)
Physician Discharge Summary  Patient ID: TULSI CROSSETT MRN: [id number] DOB/AGE: 61-Jul-1952 61 y.o.  Admit date: 10/09/2012 Discharge date: 10/10/2012  Primary Care Physician:  Fredirick Maudlin, MD  Discharge Diagnoses:   Atypical chest pain secondary to rib fracture Vertigo Diabetes mellitus Chronic pain  Hypertension Polymyalgia rheumatica   Consults: Cardiology   Recommendations for Outpatient Follow-up:  1. patient was recommended to hold Plavix for 2 days and then followup with Dr. Alanda Amass   Allergies:   Allergies  Allergen Reactions  . Biaxin (Clarithromycin)     rash  . Codeine   . Demerol (Meperidine)     Rash   . Flagyl (Metronidazole)      Discharge Medications:   Medication List    STOP taking these medications       doxycycline 100 MG DR capsule  Commonly known as:  DORYX     furosemide 20 MG tablet  Commonly known as:  LASIX      TAKE these medications       ALPRAZolam 1 MG tablet  Commonly known as:  XANAX  Take 1 mg by mouth 4 (four) times daily as needed for sleep or anxiety.     amitriptyline 100 MG tablet  Commonly known as:  ELAVIL  Take 100 mg by mouth at bedtime.     aspirin 81 MG chewable tablet  Chew 81 mg by mouth 2 (two) times daily. Two a day per patient     bimatoprost 0.01 % Soln  Commonly known as:  LUMIGAN  Place 1 drop into both eyes at bedtime.     CALCIUM + D PO  Take 1 tablet by mouth daily.     carvedilol 25 MG tablet  Commonly known as:  COREG  Take 25 mg by mouth 2 (two) times daily with a meal.     clopidogrel 75 MG tablet  Commonly known as:  PLAVIX  Take 75 mg by mouth daily.     diclofenac sodium 1 % Gel  Commonly known as:  VOLTAREN  Apply 4 g topically daily.     fentaNYL 25 MCG/HR  Commonly known as:  DURAGESIC - dosed mcg/hr  Place 1 patch onto the skin every 3 (three) days.     insulin glargine 100 UNIT/ML injection  Commonly known as:  LANTUS  Inject 0.08 mLs (8 Units total) into the  skin at bedtime.     insulin lispro 100 UNIT/ML injection  Commonly known as:  HUMALOG  Inject 5-12 Units into the skin 3 (three) times daily before meals. 5-12 units per sliding scale     isosorbide mononitrate 60 MG 24 hr tablet  Commonly known as:  IMDUR  Take 1 tablet (60 mg total) by mouth daily.     MAGNESIUM PO  Take 1 tablet by mouth daily.     meclizine 12.5 MG tablet  Commonly known as:  ANTIVERT  Take 1 tablet (12.5 mg total) by mouth 3 (three) times daily as needed for dizziness or nausea.     NORVASC 2.5 MG tablet  Generic drug:  amLODipine  Take 2.5 mg by mouth daily.     omeprazole 20 MG capsule  Commonly known as:  PRILOSEC  Take 20 mg by mouth daily.     oxyCODONE-acetaminophen 10-325 MG per tablet  Commonly known as:  PERCOCET  Take 1 tablet by mouth every 4 (four) hours as needed for pain.     predniSONE 2.5 MG tablet  Commonly known as:  DELTASONE  Take 2.5 mg by mouth daily.     senna 8.6 MG tablet  Commonly known as:  SENOKOT  Take 2 tablets by mouth daily.     sertraline 50 MG tablet  Commonly known as:  ZOLOFT  Take 50 mg by mouth daily.     timolol 0.25 % ophthalmic solution  Commonly known as:  TIMOPTIC  Place 1 drop into both eyes 2 (two) times daily.     valsartan 40 MG tablet  Commonly known as:  DIOVAN  Take 40 mg by mouth daily.     VITAMIN B 12 PO  Take 1 tablet by mouth daily.     VITAMIN D PO  Take 1 tablet by mouth daily.         Brief H and P: For complete details please refer to admission H and P, but in brief KYNLEI PIONTEK is a 62 y.o. female with known extensive history of cardiac disease, polymyalgia rheumatica, musical air syndrome, multiple issues recent falls, most recently 4 days ago and developed intractable bleeding from her leg occurring after she fell on her porch, 2/2 to a splinter which she decided to remove at home on admission. She removed the splint and noticed profuse bleeding, probably secondary to  Plavix, and aspirin, that she is on- She was in the car getting over here, she complained of chest pain.  She has had chest pain a couple of days ago , and last night. Had to take 2 nitroglycerin and 6 Aspirin for this. This is a normal occurrence happens about once a month.   Hospital Course:  Chest pain:  does not appear to be cardiogenic. Patient had a chest x-ray which showed the left rib fracture. Patient was placed on pain control and incentive spirometry. Cardiac enzymes were negative for an acute ACS. Patient was also seen by Dr. Leeann Must and did not feel that her chest pain was cardiogenic.  Bleeding from the right leg: Due to the splinter, Plavix was held during hospitalization and patient was recommended to continue to hold at home for another 2 days.  Vertigo: Patient also reported some balance issues due to vertigo-like symptoms. Patient was started on meclizine. She was ambulating with the walker and a cane.   Diabetes mellitus: Somewhat uncontrolled, patient had hyperglycemia the time of arrival, Lantus was decreased down to 8 units and continue sliding scale insulin. Patient was recommended to keep a log of blood sugars and review it with her primary care physician for further adjustment.   Day of Discharge BP 108/69  Pulse 72  Temp(Src) 97.8 F (36.6 C) (Oral)  Resp 20  Ht 5\' 4"  (1.626 m)  Wt 69.083 kg (152 lb 4.8 oz)  BMI 26.13 kg/m2  SpO2 97%  Physical Exam: General: Alert and awake oriented x3 not in any acute distress. Hearing deficit CVS: S1-S2 clear no murmur rubs or gallops Chest: clear to auscultation bilaterally, no wheezing rales or rhonchi Abdomen: soft nontender, nondistended, normal bowel sounds Extremities: no cyanosis, clubbing or edema noted bilaterally    The results of significant diagnostics from this hospitalization (including imaging, microbiology, ancillary and laboratory) are listed below for reference.    LAB RESULTS: Basic Metabolic  Panel:  Recent Labs Lab 10/09/12 1410 10/10/12 0230  NA 134* 134*  K 3.8 3.5  CL 94* 96  CO2 29 30  GLUCOSE 142* 191*  BUN 19 16  CREATININE 0.70 0.70  CALCIUM 10.0 8.9   Liver Function  Tests:  Recent Labs Lab 10/09/12 1410 10/10/12 0230  AST 21 15  ALT 12 11  ALKPHOS 96 79  BILITOT 0.4 0.3  PROT 7.3 6.1  ALBUMIN 3.7 3.1*   No results found for this basename: LIPASE, AMYLASE,  in the last 168 hours No results found for this basename: AMMONIA,  in the last 168 hours CBC:  Recent Labs Lab 10/09/12 1410 10/10/12 0230  WBC 10.9* 8.1  NEUTROABS 6.5  --   HGB 11.4* 10.5*  HCT 32.1* 29.1*  MCV 87.7 86.4  PLT 357 303   Cardiac Enzymes:  Recent Labs Lab 10/09/12 2117 10/10/12 0230  TROPONINI <0.30 <0.30   BNP: No components found with this basename: POCBNP,  CBG:  Recent Labs Lab 10/09/12 2012 10/10/12 0717  GLUCAP 156* 73    Significant Diagnostic Studies:  Dg Chest 2 View  10/09/2012   *RADIOLOGY REPORT*  Clinical Data: Fall today.  Chest pain.  CHEST - 2 VIEW  Comparison: Two-view chest x-ray yesterday [facility] and 07/30/2011 [place].  Findings: Prior sternotomy for CABG.  Cardiac silhouette normal in size.  Thoracic aorta mildly atherosclerotic, unchanged.  Hilar and mediastinal contours otherwise unremarkable.  Lungs clear. Bronchovascular markings normal.  Pulmonary vascularity normal.  No pneumothorax.  No pleural effusions. Possible mild deformity involving the mid lateral left ribs, in particular the lateral 5th rib.  Visualized bony thorax intact otherwise.  IMPRESSION: No acute cardiopulmonary disease. Possible left lateral 5th rib fracture.  If this corresponds to the site of pain and tenderness, dedicated left rib imaging may be confirmatory.   Original Report Authenticated By: Hulan Saas, M.D.   Ct Head Wo Contrast  10/09/2012   *RADIOLOGY REPORT*  Clinical Data: Fall 3 days ago, headache  CT HEAD WITHOUT CONTRAST   Technique:  Contiguous axial images were obtained from the base of the skull through the vertex without contrast.  Comparison: MRI 11/04/2010  Findings: Mild frontal atrophy.  Negative for hydrocephalus.  Mild chronic microvascular ischemic changes in the white matter.  No acute infarct.  Negative for hemorrhage mass or skull fracture.  IMPRESSION: No acute abnormality.   Original Report Authenticated By: Janeece Riggers, M.D.   Ct Abdomen Pelvis W Contrast  10/09/2012   *RADIOLOGY REPORT*  Clinical Data: Fall, pain, upper abdominal ecchymosis  CT ABDOMEN AND PELVIS WITH CONTRAST  Technique:  Multidetector CT imaging of the abdomen and pelvis was performed following the standard protocol during bolus administration of intravenous contrast.  Contrast: OMNIPAQUE IOHEXOL 300 MG/ML  SOLN  Comparison: 06/04/2012 CT  Findings: Evidence of median sternotomy partly visualized.  Lung bases demonstrate minimal dependent bibasilar atelectasis.  Calcified hepatic granuloma noted.  Liver, gallbladder, adrenal glands, kidneys, spleen, and pancreas are unremarkable.  Moderate atheromatous aortic calcification noted without aneurysm.  Anterior abdominal wall subcutaneous stranding could represent injection sites and or ecchymosis, correlate clinically.  No free air, free fluid, or lymphadenopathy.  Uterus and adnexa are normal. No bowel wall thickening or focal segmental dilatation.  The bladder is normal.  No acute osseous abnormality.  IMPRESSION: No acute intra-abdominal or pelvic pathology.  Subcutaneous fluid collections/stranding that could represent injection sites or ecchymosis, correlate clinically.   Original Report Authenticated By: Christiana Pellant, M.D.       Disposition and Follow-up:     Discharge Orders   Future Orders Complete By Expires     Diet Carb Modified  As directed     Discharge instructions  As directed  Comments:      1) Please review all the medication changes 2) Please decrease your  lantus to 8units at bed time. Keep a log of your blood sugars for your primary doctor to review for any further changes. 3) Please HOLD lasix until you follow up with Dr Juanetta Gosling or your cardiologist.    Increase activity slowly  As directed         DISPOSITION: Home  DIET: Carb modified  ACTIVITY: As tolerated     DISCHARGE FOLLOW-UP Follow-up Information   Follow up with HAWKINS,EDWARD L, MD. Schedule an appointment as soon as possible for a visit in 10 days. (for hospital follow-up)    Contact information:   [address] [place] [address] [phone]       Follow up with Governor Rooks, MD. Schedule an appointment as soon as possible for a visit in 1 week. (for hospital follow-up)    Contact information:   [address] [place] [address] [phone]       Time spent on Discharge: 35 minutes  Signed:   Wildon Cuevas M.D. [facility] 10/10/2012, 11:05 AM Pager: [phone]

## 2012-10-15 ENCOUNTER — Other Ambulatory Visit: Payer: Self-pay | Admitting: *Deleted

## 2012-10-15 DIAGNOSIS — R6889 Other general symptoms and signs: Secondary | ICD-10-CM

## 2012-10-15 DIAGNOSIS — E119 Type 2 diabetes mellitus without complications: Secondary | ICD-10-CM

## 2012-10-15 DIAGNOSIS — R011 Cardiac murmur, unspecified: Secondary | ICD-10-CM

## 2012-10-15 DIAGNOSIS — R5383 Other fatigue: Secondary | ICD-10-CM

## 2012-10-15 MED ORDER — FOLIC ACID 1 MG PO TABS: 2.0000 mg | ORAL_TABLET | Freq: Every day | ORAL | Status: DC

## 2012-10-15 MED ORDER — POLYSACCHARIDE IRON COMPLEX 150 MG PO CAPS: 150.0000 mg | ORAL_CAPSULE | Freq: Every day | ORAL | Status: AC

## 2012-10-16 ENCOUNTER — Telehealth (HOSPITAL_COMMUNITY): Payer: Self-pay | Admitting: Cardiovascular Disease

## 2012-10-16 ENCOUNTER — Encounter: Payer: Self-pay | Admitting: Cardiovascular Disease

## 2012-10-23 ENCOUNTER — Telehealth (HOSPITAL_COMMUNITY): Payer: Self-pay | Admitting: Cardiovascular Disease

## 2012-10-23 NOTE — Telephone Encounter (Signed)
Spoke with [name].. She will contact [facility] first before scheduling appt

## 2012-10-23 NOTE — Telephone Encounter (Signed)
Left message for patient to call back and schedule testing

## 2012-11-08 ENCOUNTER — Telehealth (HOSPITAL_COMMUNITY): Payer: Self-pay | Admitting: Cardiovascular Disease

## 2012-11-21 ENCOUNTER — Other Ambulatory Visit: Payer: Self-pay | Admitting: *Deleted

## 2012-11-21 MED ORDER — CLOPIDOGREL BISULFATE 75 MG PO TABS: 75.0000 mg | ORAL_TABLET | Freq: Every day | ORAL | Status: DC

## 2012-11-21 NOTE — Telephone Encounter (Signed)
Rx was sent to pharmacy electronically via [employer] 

## 2013-01-07 ENCOUNTER — Emergency Department (HOSPITAL_COMMUNITY): Payer: Medicare Other

## 2013-01-07 ENCOUNTER — Inpatient Hospital Stay (HOSPITAL_COMMUNITY)
Admission: EM | Admit: 2013-01-07 | Discharge: 2013-01-09 | DRG: 247 | Disposition: A | Discharge status: Home / Self Care | Payer: Medicare Other | Attending: Cardiovascular Disease | Admitting: Cardiovascular Disease

## 2013-01-07 ENCOUNTER — Telehealth: Payer: Self-pay | Admitting: *Deleted

## 2013-01-07 ENCOUNTER — Encounter (HOSPITAL_COMMUNITY): Payer: Self-pay | Admitting: Emergency Medicine

## 2013-01-07 DIAGNOSIS — R079 Chest pain, unspecified: Secondary | ICD-10-CM

## 2013-01-07 DIAGNOSIS — M353 Polymyalgia rheumatica: Secondary | ICD-10-CM | POA: Diagnosis present

## 2013-01-07 DIAGNOSIS — E1065 Type 1 diabetes mellitus with hyperglycemia: Secondary | ICD-10-CM | POA: Diagnosis present

## 2013-01-07 DIAGNOSIS — Z7982 Long term (current) use of aspirin: Secondary | ICD-10-CM

## 2013-01-07 DIAGNOSIS — Z8249 Family history of ischemic heart disease and other diseases of the circulatory system: Secondary | ICD-10-CM

## 2013-01-07 DIAGNOSIS — Z7902 Long term (current) use of antithrombotics/antiplatelets: Secondary | ICD-10-CM

## 2013-01-07 DIAGNOSIS — K219 Gastro-esophageal reflux disease without esophagitis: Secondary | ICD-10-CM | POA: Diagnosis present

## 2013-01-07 DIAGNOSIS — E1365 Other specified diabetes mellitus with hyperglycemia: Secondary | ICD-10-CM

## 2013-01-07 DIAGNOSIS — I10 Essential (primary) hypertension: Secondary | ICD-10-CM | POA: Diagnosis present

## 2013-01-07 DIAGNOSIS — I25810 Atherosclerosis of coronary artery bypass graft(s) without angina pectoris: Secondary | ICD-10-CM | POA: Diagnosis present

## 2013-01-07 DIAGNOSIS — IMO0002 Reserved for concepts with insufficient information to code with codable children: Secondary | ICD-10-CM | POA: Diagnosis present

## 2013-01-07 DIAGNOSIS — T82897A Other specified complication of cardiac prosthetic devices, implants and grafts, initial encounter: Secondary | ICD-10-CM | POA: Diagnosis present

## 2013-01-07 DIAGNOSIS — I214 Non-ST elevation (NSTEMI) myocardial infarction: Principal | ICD-10-CM | POA: Diagnosis present

## 2013-01-07 DIAGNOSIS — I509 Heart failure, unspecified: Secondary | ICD-10-CM | POA: Diagnosis present

## 2013-01-07 DIAGNOSIS — I2510 Atherosclerotic heart disease of native coronary artery without angina pectoris: Secondary | ICD-10-CM | POA: Diagnosis present

## 2013-01-07 DIAGNOSIS — J449 Chronic obstructive pulmonary disease, unspecified: Secondary | ICD-10-CM | POA: Diagnosis present

## 2013-01-07 DIAGNOSIS — J4489 Other specified chronic obstructive pulmonary disease: Secondary | ICD-10-CM | POA: Diagnosis present

## 2013-01-07 DIAGNOSIS — Y831 Surgical operation with implant of artificial internal device as the cause of abnormal reaction of the patient, or of later complication, without mention of misadventure at the time of the procedure: Secondary | ICD-10-CM | POA: Diagnosis present

## 2013-01-07 DIAGNOSIS — Z794 Long term (current) use of insulin: Secondary | ICD-10-CM

## 2013-01-07 DIAGNOSIS — Z79899 Other long term (current) drug therapy: Secondary | ICD-10-CM

## 2013-01-07 DIAGNOSIS — I252 Old myocardial infarction: Secondary | ICD-10-CM

## 2013-01-07 DIAGNOSIS — F17200 Nicotine dependence, unspecified, uncomplicated: Secondary | ICD-10-CM | POA: Diagnosis present

## 2013-01-07 HISTORY — DX: Major depressive disorder, single episode, unspecified: F32.9

## 2013-01-07 HISTORY — DX: Depression, unspecified: F32.A

## 2013-01-07 HISTORY — DX: Gastro-esophageal reflux disease without esophagitis: K21.9

## 2013-01-07 HISTORY — DX: Cerebral infarction, unspecified: I63.9

## 2013-01-07 HISTORY — DX: Unstable angina: I20.0

## 2013-01-07 HISTORY — DX: Pneumonia, unspecified organism: J18.9

## 2013-01-07 HISTORY — DX: Acute myocardial infarction, unspecified: I21.9

## 2013-01-07 HISTORY — DX: Polymyalgia rheumatica: M35.3

## 2013-01-07 HISTORY — DX: Essential (primary) hypertension: I10

## 2013-01-07 HISTORY — DX: Atherosclerotic heart disease of native coronary artery without angina pectoris: I25.10

## 2013-01-07 LAB — CBC
Hemoglobin: 12.2 g/dL (ref 12.0–15.0)
MCH: 31.4 pg (ref 26.0–34.0)
RBC: 3.89 MIL/uL (ref 3.87–5.11)
RDW: 12.4 % (ref 11.5–15.5)
WBC: 9.8 10*3/uL (ref 4.0–10.5)

## 2013-01-07 LAB — POCT I-STAT TROPONIN I: Troponin i, poc: 0.19 ng/mL (ref 0.00–0.08)

## 2013-01-07 LAB — COMPREHENSIVE METABOLIC PANEL
ALT: 13 U/L (ref 0–35)
Alkaline Phosphatase: 76 U/L (ref 39–117)
CO2: 29 mEq/L (ref 19–32)
Calcium: 9.6 mg/dL (ref 8.4–10.5)
GFR calc Af Amer: 74 mL/min — ABNORMAL LOW (ref >90)
GFR calc non Af Amer: 64 mL/min — ABNORMAL LOW (ref >90)
Glucose, Bld: 572 mg/dL (ref 70–99)
Sodium: 127 mEq/L — ABNORMAL LOW (ref 135–145)
Total Bilirubin: 0.2 mg/dL — ABNORMAL LOW (ref 0.3–1.2)

## 2013-01-07 LAB — GLUCOSE, CAPILLARY: Glucose-Capillary: 357 mg/dL — ABNORMAL HIGH (ref 70–99)

## 2013-01-07 LAB — MRSA PCR SCREENING: MRSA by PCR: NEGATIVE

## 2013-01-07 LAB — TROPONIN I: Troponin I: <0.3 ng/mL (ref <0.30)

## 2013-01-07 MED ORDER — SODIUM CHLORIDE 0.9 % IJ SOLN: 3.0000 mL | INTRAMUSCULAR | Status: DC | PRN

## 2013-01-07 MED ORDER — PANTOPRAZOLE SODIUM 40 MG PO TBEC
40.0000 mg | DELAYED_RELEASE_TABLET | Freq: Every day | ORAL | Status: DC
  Administered 2013-01-08 – 2013-01-09 (×2): 40 mg via ORAL
  Filled 2013-01-07 (×2): qty 1

## 2013-01-07 MED ORDER — NITROGLYCERIN 0.4 MG SL SUBL
0.4000 mg | SUBLINGUAL_TABLET | SUBLINGUAL | Status: DC | PRN
  Administered 2013-01-07 (×3): 0.4 mg via SUBLINGUAL
  Filled 2013-01-07: qty 25

## 2013-01-07 MED ORDER — CLOPIDOGREL BISULFATE 75 MG PO TABS
75.0000 mg | ORAL_TABLET | Freq: Every day | ORAL | Status: DC
  Administered 2013-01-08 – 2013-01-09 (×2): 75 mg via ORAL
  Filled 2013-01-07 (×3): qty 1

## 2013-01-07 MED ORDER — SODIUM CHLORIDE 0.9 % IV SOLN: 250.0000 mL | INTRAVENOUS | Status: DC | PRN

## 2013-01-07 MED ORDER — PREDNISONE 10 MG PO TABS
10.0000 mg | ORAL_TABLET | Freq: Every day | ORAL | Status: DC
Start: 2013-01-08 — End: 2013-01-09
  Administered 2013-01-08 – 2013-01-09 (×2): 10 mg via ORAL
  Filled 2013-01-07 (×3): qty 1

## 2013-01-07 MED ORDER — ACETAMINOPHEN 325 MG PO TABS: 650.0000 mg | ORAL_TABLET | ORAL | Status: DC | PRN

## 2013-01-07 MED ORDER — AMITRIPTYLINE HCL 100 MG PO TABS
100.0000 mg | ORAL_TABLET | Freq: Every day | ORAL | Status: DC
  Administered 2013-01-07 – 2013-01-08 (×2): 100 mg via ORAL
  Filled 2013-01-07 (×3): qty 1

## 2013-01-07 MED ORDER — EPTIFIBATIDE BOLUS VIA INFUSION
180.0000 ug/kg | Freq: Once | INTRAVENOUS | Status: AC
  Administered 2013-01-07: 12400 ug via INTRAVENOUS
  Filled 2013-01-07: qty 17

## 2013-01-07 MED ORDER — MORPHINE SULFATE 4 MG/ML IJ SOLN
INTRAMUSCULAR | Status: AC
  Administered 2013-01-07: 19:00:00
  Filled 2013-01-07: qty 1

## 2013-01-07 MED ORDER — ALPRAZOLAM 0.5 MG PO TABS
1.0000 mg | ORAL_TABLET | Freq: Four times a day (QID) | ORAL | Status: DC | PRN
  Administered 2013-01-08 – 2013-01-09 (×7): 1 mg via ORAL
  Filled 2013-01-07 (×8): qty 2

## 2013-01-07 MED ORDER — LATANOPROST 0.005 % OP SOLN
1.0000 [drp] | Freq: Every day | OPHTHALMIC | Status: DC
  Administered 2013-01-08: 1 [drp] via OPHTHALMIC
  Filled 2013-01-07: qty 2.5

## 2013-01-07 MED ORDER — SODIUM CHLORIDE 0.9 % IJ SOLN
3.0000 mL | Freq: Two times a day (BID) | INTRAMUSCULAR | Status: DC
  Administered 2013-01-07: 3 mL via INTRAVENOUS
  Administered 2013-01-08: 09:00:00 via INTRAVENOUS

## 2013-01-07 MED ORDER — INSULIN GLARGINE 100 UNIT/ML ~~LOC~~ SOLN
24.0000 [IU] | Freq: Every day | SUBCUTANEOUS | Status: DC
  Administered 2013-01-08: 24 [IU] via SUBCUTANEOUS
  Filled 2013-01-07 (×3): qty 0.24

## 2013-01-07 MED ORDER — INSULIN ASPART 100 UNIT/ML ~~LOC~~ SOLN
0.0000 [IU] | Freq: Three times a day (TID) | SUBCUTANEOUS | Status: DC
  Administered 2013-01-08: 11 [IU] via SUBCUTANEOUS
  Administered 2013-01-08: 7 [IU] via SUBCUTANEOUS

## 2013-01-07 MED ORDER — POLYSACCHARIDE IRON COMPLEX 150 MG PO CAPS
150.0000 mg | ORAL_CAPSULE | Freq: Every day | ORAL | Status: DC
  Administered 2013-01-08 – 2013-01-09 (×2): 150 mg via ORAL
  Filled 2013-01-07 (×2): qty 1

## 2013-01-07 MED ORDER — NITROGLYCERIN IN D5W 200-5 MCG/ML-% IV SOLN
2.0000 ug/min | INTRAVENOUS | Status: DC
  Administered 2013-01-07: 5 ug/min via INTRAVENOUS
  Administered 2013-01-08: 30 ug/min via INTRAVENOUS
  Filled 2013-01-07: qty 250

## 2013-01-07 MED ORDER — MORPHINE SULFATE 4 MG/ML IJ SOLN
4.0000 mg | Freq: Once | INTRAMUSCULAR | Status: AC
  Administered 2013-01-07: 4 mg via INTRAVENOUS

## 2013-01-07 MED ORDER — INSULIN ASPART 100 UNIT/ML ~~LOC~~ SOLN
5.0000 [IU] | Freq: Once | SUBCUTANEOUS | Status: AC
  Administered 2013-01-07: 5 [IU] via INTRAVENOUS
  Filled 2013-01-07: qty 1

## 2013-01-07 MED ORDER — CARVEDILOL 25 MG PO TABS
25.0000 mg | ORAL_TABLET | Freq: Two times a day (BID) | ORAL | Status: DC
  Administered 2013-01-08 – 2013-01-09 (×3): 25 mg via ORAL
  Filled 2013-01-07 (×5): qty 1

## 2013-01-07 MED ORDER — TIMOLOL MALEATE 0.25 % OP SOLN
1.0000 [drp] | Freq: Two times a day (BID) | OPHTHALMIC | Status: DC
  Administered 2013-01-07 – 2013-01-09 (×4): 1 [drp] via OPHTHALMIC
  Filled 2013-01-07: qty 5

## 2013-01-07 MED ORDER — HEPARIN (PORCINE) IN NACL 100-0.45 UNIT/ML-% IJ SOLN
800.0000 [IU]/h | INTRAMUSCULAR | Status: DC
  Administered 2013-01-07 (×2): 800 [IU]/h via INTRAVENOUS
  Filled 2013-01-07: qty 250

## 2013-01-07 MED ORDER — ONDANSETRON HCL 4 MG/2ML IJ SOLN: 4.0000 mg | Freq: Four times a day (QID) | INTRAMUSCULAR | Status: DC | PRN

## 2013-01-07 MED ORDER — EPTIFIBATIDE 75 MG/100ML IV SOLN
2.0000 ug/kg/min | INTRAVENOUS | Status: DC
  Administered 2013-01-07 – 2013-01-08 (×2): 2 ug/kg/min via INTRAVENOUS
  Filled 2013-01-07 (×5): qty 100

## 2013-01-07 MED ORDER — SERTRALINE HCL 50 MG PO TABS
50.0000 mg | ORAL_TABLET | Freq: Every day | ORAL | Status: DC
Start: 2013-01-08 — End: 2013-01-09
  Administered 2013-01-08 – 2013-01-09 (×2): 50 mg via ORAL
  Filled 2013-01-07 (×3): qty 1

## 2013-01-07 MED ORDER — SODIUM CHLORIDE 0.9 % IV SOLN
INTRAVENOUS | Status: DC
  Administered 2013-01-07: 23:00:00 via INTRAVENOUS

## 2013-01-07 MED ORDER — AMLODIPINE BESYLATE 2.5 MG PO TABS
2.5000 mg | ORAL_TABLET | Freq: Every day | ORAL | Status: DC
  Administered 2013-01-08 – 2013-01-09 (×2): 2.5 mg via ORAL
  Filled 2013-01-07 (×2): qty 1

## 2013-01-07 MED ORDER — FENTANYL 25 MCG/HR TD PT72
25.0000 ug | MEDICATED_PATCH | TRANSDERMAL | Status: DC
  Administered 2013-01-07: 25 ug via TRANSDERMAL
  Filled 2013-01-07: qty 1

## 2013-01-07 MED ORDER — ASPIRIN 81 MG PO CHEW: 81.0000 mg | CHEWABLE_TABLET | Freq: Two times a day (BID) | ORAL | Status: DC

## 2013-01-07 MED ORDER — HEPARIN BOLUS VIA INFUSION
3000.0000 [IU] | Freq: Once | INTRAVENOUS | Status: AC
  Administered 2013-01-07: 3000 [IU] via INTRAVENOUS
  Filled 2013-01-07: qty 3000

## 2013-01-07 NOTE — Telephone Encounter (Signed)
Paper chart received for review.

## 2013-01-07 NOTE — ED Provider Notes (Signed)
62 year old female with history of coronary bypass has been awakened by chest pain for the last 3 days. Pain is a pressure, heavy feeling with associated dyspnea and nausea but no diaphoresis. Prior to today, pain was relieved with 3 nitroglycerin but today his pain was not relieved. Pain is severe and she rates it at 7/10. Nothing makes it better nothing makes it worse. On exam, lungs are clear, heart has regular rate rhythm, extremities have no cyanosis or edema. ECG shows some ST depression worrisome for ischemia but unchanged from previous ECG. 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 She clinically has unstable angina. Troponin has come back slightly elevated. She started on heparin and nitroglycerin drips and will need to be admitted to cardiology.  CRITICAL CARE Performed by: Dione Booze Total critical care time: 35 minutes Critical care time was exclusive of separately billable procedures and treating other patients. Critical care was necessary to treat or prevent imminent or life-threatening deterioration. Critical care was time spent personally by me on the following activities: development of treatment plan with patient and/or surrogate as well as nursing, discussions with consultants, evaluation of patient's response to treatment, examination of patient, obtaining history from patient or surrogate, ordering and performing treatments and interventions, ordering and review of laboratory studies, ordering and review of radiographic studies, pulse oximetry and re-evaluation of patient's condition.  I saw and evaluated the patient, reviewed the resident's note and I agree with the findings and plan.  EKG Interpretation     Ventricular Rate:  73 PR Interval:  156 QRS Duration: 100 QT Interval:  372 QTC Calculation: 409 R Axis:   72 Text Interpretation:  Sinus rhythm with  occasional Premature ventricular complexes Marked ST abnormality, possible lateral subendocardial injury Abnormal ECG When compared with ECG of 10/09/2012, No significant change was found              Dione Booze, MD 01/07/13 1749

## 2013-01-07 NOTE — H&P (Signed)
Chief Complaint: Chest Pain  HPI: The patient is a 62 y/o female, previously followed by Dr. Alanda Amass, who presents with a complaint of chest pain. Her cardiac history is significant for CAD, s/p CABG in 1995 with a LIMA to LAD, saphenous vein graft to OM1, saphenous vein graft to a diagonal, and saphenous vein graft to the right coronary artery. Her last LHC was in 2012 for evaluation of chest pain. It was performed by Dr. Rennis Golden. She was found to have patent grafts. 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 She was started on BID Ranexa. Her history is also significant for polymyalgia rheumatica.  She presented to the ER today with a complaint of severe substernal chest pressure, off and on for the last 3 days. It is described as pressure/tight pain with radiation down her arms and back. No relief with SL NTG. Her pain currently is 7/10.   Telemetry shows ST depressions. POC troponin in the ER is elevated at 0.19.    Past Medical History  Diagnosis Date  . Respiratory arrest   . COPD (chronic obstructive pulmonary disease)   . CHF (congestive heart failure)   . Diabetes mellitus   . Fibromyalgia   . Deaf     Past Surgical History  Procedure Laterality Date  . Coronary artery bypass graft      Family History  Problem Relation Age of Onset  . Hypertension Father    Social History:  reports that she has been smoking.  She does not have any smokeless tobacco history on file. She reports that she does not drink alcohol or use illicit drugs.  Allergies:  Allergies  Allergen Reactions  . Biaxin [Clarithromycin]     rash  . Codeine   . Demerol [Meperidine]     Rash   . Flagyl [Metronidazole]     Prior to Admission medications   Medication Sig Start Date End Date Taking? Authorizing Provider  ALPRAZolam Prudy Feeler) 1 MG tablet Take 1 mg by mouth 4 (four) times daily as needed for sleep or anxiety.    Yes Historical Provider, MD  amitriptyline (ELAVIL) 100 MG tablet Take 100 mg by mouth at bedtime.   Yes Historical Provider, MD  amLODipine (NORVASC) 2.5 MG tablet Take 2.5 mg by mouth daily.   Yes Historical Provider, MD  aspirin 81 MG chewable tablet Chew 81 mg by mouth 2 (two) times daily. Two a day per patient   Yes Historical Provider, MD  bimatoprost (LUMIGAN) 0.01 % SOLN Place 1 drop into both eyes at bedtime.   Yes Historical Provider, MD  Calcium Carbonate-Vitamin D (CALCIUM + D PO) Take 1 tablet by mouth daily.   Yes Historical Provider, MD  carvedilol (COREG) 25 MG tablet Take 25 mg by mouth 2 (two) times daily with a meal.   Yes Historical Provider, MD  Cholecalciferol (VITAMIN D PO) Take 1 tablet by mouth daily.   Yes Historical Provider, MD  clopidogrel (PLAVIX) 75 MG tablet Take 1 tablet (75 mg total) by mouth daily. 11/21/12  Yes Governor Rooks, MD  Cyanocobalamin (VITAMIN B 12 PO) Take 1 tablet by mouth daily.   Yes Historical Provider, MD  diclofenac sodium (VOLTAREN) 1 % GEL Apply 4 g topically daily.   Yes Historical Provider, MD  doxycycline (VIBRAMYCIN) 100 MG capsule Take 100 mg by mouth 2 (two) times daily.   Yes Historical Provider, MD  fentaNYL (DURAGESIC - DOSED MCG/HR) 25 MCG/HR Place 1 patch onto the skin every 3 (three) days.   Yes Historical Provider, MD  furosemide (LASIX) 40 MG tablet Take 40 mg by mouth daily.   Yes Historical Provider, MD  insulin glargine (LANTUS) 100 UNIT/ML injection Inject 24 Units into the skin at bedtime. 10/10/12  Yes Ripudeep Jenna Luo, MD  insulin lispro (HUMALOG) 100 UNIT/ML injection Inject 5-12 Units into the skin 3 (three) times daily before meals. 5-12 units per sliding scale   Yes Historical Provider, MD  iron polysaccharides (NU-IRON) 150 MG capsule Take 1 capsule (150 mg total) by mouth daily. 10/15/12  Yes Governor Rooks, MD  isosorbide mononitrate (IMDUR) 60 MG 24 hr tablet Take 60 mg by mouth 2 (two) times daily. 10/10/12   Yes Ripudeep Jenna Luo, MD  nitroGLYCERIN (NITROSTAT) 0.4 MG SL tablet Place 0.4 mg under the tongue every 5 (five) minutes as needed for chest pain.   Yes Historical Provider, MD  omeprazole (PRILOSEC) 20 MG capsule Take 20 mg by mouth daily.   Yes Historical Provider, MD  oxyCODONE-acetaminophen (PERCOCET) 10-325 MG per tablet Take 1 tablet by mouth every 4 (four) hours as needed for pain.   Yes Historical Provider, MD  predniSONE (DELTASONE) 10 MG tablet Take 10 mg by mouth daily.   Yes Historical Provider, MD  sertraline (ZOLOFT) 50 MG tablet Take 50 mg by mouth daily.   Yes Historical Provider, MD  timolol (TIMOPTIC) 0.25 % ophthalmic solution Place 1 drop into both eyes 2 (two) times daily.   Yes Historical Provider, MD  valsartan (DIOVAN) 40 MG tablet Take 40 mg by mouth daily.   Yes Historical Provider, MD     Results for orders placed during the hospital encounter of 01/07/13 (from the past 48 hour(s))  CBC     Status: Abnormal   Collection Time    01/07/13  3:59 PM      Result Value Range   WBC 9.8  4.0 - 10.5 K/uL   RBC 3.89  3.87 - 5.11 MIL/uL   Hemoglobin 12.2  12.0 - 15.0 g/dL   HCT 16.1 (*) 09.6 - 04.5 %   MCV 87.4  78.0 - 100.0 fL   MCH 31.4  26.0 - 34.0 pg   MCHC 35.9  30.0 - 36.0 g/dL   RDW 40.9  81.1 - 91.4 %   Platelets 333  150 - 400 K/uL  COMPREHENSIVE METABOLIC PANEL     Status: Abnormal   Collection Time    01/07/13  3:59 PM      Result Value Range   Sodium 127 (*) 135 - 145 mEq/L   Potassium 4.2  3.5 - 5.1 mEq/L   Chloride 86 (*) 96 - 112 mEq/L   CO2 29  19 - 32 mEq/L   Glucose, Bld 572 (*) 70 - 99 mg/dL   Comment: CRITICAL RESULT CALLED TO, READ BACK BY AND VERIFIED WITH:     YUAL K,RN 01/07/13 1700 WAYK   BUN 28 (*) 6 - 23 mg/dL   Creatinine, Ser 7.82  0.50 - 1.10 mg/dL   Calcium 9.6  8.4 - 95.6 mg/dL   Total Protein 7.9  6.0 - 8.3 g/dL   Albumin 4.3  3.5 - 5.2 g/dL   AST 17  0 - 37 U/L   ALT 13  0 - 35 U/L   Alkaline Phosphatase 76  39 - 117 U/L    Total Bilirubin 0.2 (*) 0.3 - 1.2  mg/dL   GFR calc non Af Amer 64 (*) >90 mL/min   GFR calc Af Amer 74 (*) >90 mL/min   Comment: (NOTE)     The eGFR has been calculated using the CKD EPI equation.     This calculation has not been validated in all clinical situations.     eGFR's persistently <90 mL/min signify possible Chronic Kidney     Disease.  POCT I-STAT TROPONIN I     Status: Abnormal   Collection Time    01/07/13  5:11 PM      Result Value Range   Troponin i, poc 0.19 (*) 0.00 - 0.08 ng/mL   Comment NOTIFIED PHYSICIAN     Comment 3            Comment: Due to the release kinetics of cTnI,     a negative result within the first hours     of the onset of symptoms does not rule out     myocardial infarction with certainty.     If myocardial infarction is still suspected,     repeat the test at appropriate intervals.   Dg Chest 2 View  01/07/2013   CLINICAL DATA:  Chest pain, shortness of breath  EXAM: CHEST  2 VIEW  COMPARISON:  10/09/2012  FINDINGS: Increased interstitial markings. No frank interstitial edema. No pleural effusion or pneumothorax.  Heart is normal in size. Postsurgical changes related to prior CABG. Coronary stent.  Visualized osseous structures are within normal limits.  IMPRESSION: No evidence of acute cardiopulmonary disease.   Electronically Signed   By: Charline Bills M.D.   On: 01/07/2013 16:42    Review of Systems  Respiratory: Negative for shortness of breath.   Cardiovascular: Positive for chest pain.  Musculoskeletal: Positive for back pain.  All other systems reviewed and are negative.    Blood pressure 152/55, pulse 69, temperature 97.9 F (36.6 C), temperature source Oral, resp. rate 16, height 5' 4.17" (1.63 m), weight 152 lb 5.4 oz (69.1 kg), SpO2 100.00%. Physical Exam  Constitutional: She is oriented to person, place, and time. She appears well-developed and well-nourished. She appears distressed (mild).  Cardiovascular: Normal rate,  regular rhythm and intact distal pulses.  Exam reveals no gallop and no friction rub.   Murmur heard. Pulses:      Radial pulses are 2+ on the right side, and 2+ on the left side.       Dorsalis pedis pulses are 2+ on the right side, and 2+ on the left side.  Respiratory: Effort normal and breath sounds normal. No respiratory distress. She has no wheezes. She has no rales.  Musculoskeletal: She exhibits no edema.  Neurological: She is alert and oriented to person, place, and time.  Skin: Skin is warm and dry. She is not diaphoretic.  Psychiatric: She has a normal mood and affect. Her behavior is normal.     Assessment/Plan Active Problems:   NSTEMI (non-ST elevated myocardial infarction)  Plan: 63 y/o female w/ h/o CABG in 1995. Her last LHC was in 2012 and showed patent stents. Returns with severe SSCP. EKG shows ST depressions. She has ruled in for NSTEI w/ initial POC troponin of 0.19. Continue IV heparin and IV NTG. Will admit to stepdown and will plan for LHC in the am. MD to follow.   SIMMONS, BRITTAINY 01/07/2013, 7:04 PM   Patient seen and examined. Agree with assessment and plan. Pt is a 62 yo previous pt of Dr. Alanda Amass with  known CAD. 19 yrs s/p CABG with native vessel occlusion. She had last cath over 2 yrs ago and grafts were patent at that time. She presents now with 1 week history of progressive daily chest pain, unstable angina and has mildly positive initial troponin. She has had documented ST changes on prior ECG's. In ER still with discomfort. I have begun titration on IV NTG, given MSO4 with improvement and will start integrelin. Glu 572 on initial lab, will start NS and give insulin. Pt sees Dr. Evlyn Kanner for endocrine care. Pt states that she had developed increased LFT's in past on ranexa. Will need repeat cath; plan in am unless becomes unstable tonight.   Lennette Bihari, MD, [facility] 01/07/2013 7:28 PM

## 2013-01-07 NOTE — Telephone Encounter (Signed)
Returned call.  Left message to call back before 4pm. [name])  Returned call to pt's number and spoke w/ [name], pt's caregiver (EC).  Stated pt woke up w/ CP today.  Took 3 baby ASA and 2 NTG w/ relief of CP.  Stated pt w/ int CP x 1 week being relieved by 1-2 NTG and 3 baby ASA.  Pt also diabetic and had increase in prednisone which increased BG which pt is on sliding scale insulin.    Judie Grieve, PA-C notified and advised pt go to ER for evaluation now.  Caregiver informed of advice and stated she doesn't think pt will go.  Stated she agrees pt needs to be seen, but wants to see Dr. Tresa Endo.  Informed Dr. Landry Dyke first available appt is next week and doesn't want to see Extender although RN informed appt in office is not appropriate r/t pt's complaints and CP relieved by NTG/ASA multiple times this week.  Caregiver stated she will talk to pt and call back.  Also informed caregiver that Dr. Tresa Endo is on-call at [facility] today if that will help her go to ER.  Verbalized understanding and will call back.   Of note, caregiver stated pt is hard of hearing and did not let RN speak w/ pt when asked.

## 2013-01-07 NOTE — Progress Notes (Addendum)
ANTICOAGULATION CONSULT NOTE - Initial Consult  Pharmacy Consult:  Heparin Indication:  ACS  Allergies  Allergen Reactions  . Biaxin [Clarithromycin]     rash  . Codeine   . Demerol [Meperidine]     Rash   . Flagyl [Metronidazole]     Patient Measurements: Height: 5' 4.17" (163 cm) Weight: 152 lb 5.4 oz (69.1 kg) IBW/kg (Calculated) : 55.1 Heparin Dosing Weight: 69 kg  Vital Signs: Temp: 97.9 F (36.6 C) (10/27 1649) Temp src: Oral (10/27 1649) BP: 141/59 mmHg (10/27 1657) Pulse Rate: 45 (10/27 1521)  Labs:  Recent Labs  01/07/13 1559  HGB 12.2  HCT 34.0*  PLT 333  CREATININE 0.94    Estimated Creatinine Clearance: 60.2 ml/min (by C-G formula based on Cr of 0.94).   Medical History: Past Medical History  Diagnosis Date  . Respiratory arrest   . COPD (chronic obstructive pulmonary disease)   . CHF (congestive heart failure)   . Diabetes mellitus   . Fibromyalgia   . Deaf         Assessment: 22 YOF with ongoing chest pain to start IV heparin.  Baseline labs reviewed.  Patient is not on anticoagulation PTA.   Goal of Therapy:  Heparin level 0.3-0.7 units/ml Monitor platelets by anticoagulation protocol: Yes    Plan:  - Heparin 3000 units IV bolus, then - Heparin gtt at 800 units/hr - Check 6 hr HL - Daily HL / CBC    Marquisha Nikolov D. Laney Potash, PharmD, BCPS Pager:  [phone] 01/07/2013, 5:43 PM    =================================  Addendum:   - history of CABG with native vessel occlusion - cath in 2012 showed patent stents - add Integrilin and cath in AM - CrCL > 50 ml/min   Plan: - Integrilin 180 mcg/kg IV bolus, then - Integrilin infusion at 2 mcg/kg/min - Check plts 8 hrs after Integrilin initiation (along with AM labs) - Continue current heparin infusion rate, but will aim for goal of 0.3 - 0.5 units/mL     Liesl Simons D. Laney Potash, PharmD, BCPS Pager:  [phone] 01/07/2013, 7:54 PM

## 2013-01-07 NOTE — Telephone Encounter (Signed)
Paper chart requested.

## 2013-01-07 NOTE — Telephone Encounter (Signed)
Martie called stating that pt is having cp that comes and goes and she wanted to know what to do. She was a Dr. Alanda Amass pt and is due next month for an appointment. Pt is anxious.

## 2013-01-07 NOTE — Telephone Encounter (Signed)
Returned call. Left message to call back.

## 2013-01-07 NOTE — Telephone Encounter (Signed)
^^  Late Entry^^: RN did inform caregiver that first available appt is not until tomorrow afternoon and pt should be seen today in ER.
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
## 2013-01-07 NOTE — ED Notes (Signed)
Pt complains of increased cp. Placed O2 2L [place], repeated ECG and shown to Dr Preston Fleeting. Resident in to re-eval pt

## 2013-01-07 NOTE — ED Notes (Signed)
Cp started everyday for last week has been taking nitro and baby asa woke up w/ cp this am

## 2013-01-07 NOTE — ED Provider Notes (Signed)
CSN: [id number]     Arrival date & time 01/07/13  1515 History   First MD Initiated Contact with Patient 01/07/13 1620     Chief Complaint  Patient presents with  . Chest Pain   The history is provided by the patient. No language interpreter was used.   [name] is a 62 y.o. Caucasian female with past medical history of an MI and congestive heart failure CABG 1995 comes to the emergency department today with chest pain. She has been having chest pain which is stable with exertion past several months. It has become progressively worse with left physical activity. Over the past week she's been having chest pain with minimal exertion it was also 3 nitroglycerin and aspirin. Today she has chest pain at rest she took 3 nitroglycerin which improved the pain however continued to return result she came to the emergency department for evaluation. She states that the pain is similar to her previous MI. Chest no fevers, no chills, no shortness of breath, and no cough.  Past Medical History  Diagnosis Date  . Respiratory arrest   . COPD (chronic obstructive pulmonary disease)   . CHF (congestive heart failure)   . Diabetes mellitus   . Fibromyalgia   . Deaf   . Myocardial infarction   . Stroke   . GERD (gastroesophageal reflux disease)   . Depression   . Hypertension   . Pneumonia    Past Surgical History  Procedure Laterality Date  . Coronary artery bypass graft    . Eye surgery     Family History  Problem Relation Age of Onset  . Hypertension Father    History  Substance Use Topics  . Smoking status: Former Games developer  . Smokeless tobacco: Not on file  . Alcohol Use: No   OB History   Grav Para Term Preterm Abortions TAB SAB Ect Mult Living                 Review of Systems  Constitutional: Negative for fever and chills.  Respiratory: Negative for cough and shortness of breath.   Cardiovascular: Positive for chest pain.  Gastrointestinal: Negative for nausea, vomiting, abdominal  pain, diarrhea and constipation.  Genitourinary: Negative for dysuria, urgency and frequency.  Skin: Negative for color change and wound.  All other systems reviewed and are negative.    Allergies  Biaxin; Codeine; Demerol; and Flagyl  Home Medications   No current outpatient prescriptions on file. BP 134/49  Pulse 45  Temp(Src) 98.7 F (37.1 C)  Resp 20  SpO2 95% Physical Exam  Nursing note and vitals reviewed. Constitutional: She is oriented to person, place, and time. She appears well-developed and well-nourished. No distress.  HENT:  Head: Normocephalic and atraumatic.  Eyes: Pupils are equal, round, and reactive to light.  Neck: Normal range of motion.  Cardiovascular: Normal rate, regular rhythm, normal heart sounds and intact distal pulses.   Pulmonary/Chest: Effort normal. No respiratory distress. She has no wheezes. She exhibits no tenderness.  Abdominal: Soft. Bowel sounds are normal. She exhibits no distension. There is no tenderness. There is no rebound and no guarding.  Neurological: She is alert and oriented to person, place, and time. She has normal strength. No cranial nerve deficit or sensory deficit. She exhibits normal muscle tone. Coordination and gait normal.  Skin: Skin is warm and dry.    ED Course  Procedures  Labs Review Labs Reviewed  CBC - Abnormal; Notable for the following:  HCT 34.0 (*)    All other components within normal limits  COMPREHENSIVE METABOLIC PANEL - Abnormal; Notable for the following:    Sodium 127 (*)    Chloride 86 (*)    Glucose, Bld 572 (*)    BUN 28 (*)    Total Bilirubin 0.2 (*)    GFR calc non Af Amer 64 (*)    GFR calc Af Amer 74 (*)    All other components within normal limits  GLUCOSE, CAPILLARY - Abnormal; Notable for the following:    Glucose-Capillary 357 (*)    All other components within normal limits  GLUCOSE, CAPILLARY - Abnormal; Notable for the following:    Glucose-Capillary 320 (*)    All other  components within normal limits  POCT I-STAT TROPONIN I - Abnormal; Notable for the following:    Troponin i, poc 0.19 (*)    All other components within normal limits  MRSA PCR SCREENING  TROPONIN I  HEPARIN LEVEL (UNFRACTIONATED)  HEPARIN LEVEL (UNFRACTIONATED)  CBC  TROPONIN I  TROPONIN I  BASIC METABOLIC PANEL  PROTIME-INR   Imaging Review Dg Chest 2 View  01/07/2013   CLINICAL DATA:  Chest pain, shortness of breath  EXAM: CHEST  2 VIEW  COMPARISON:  10/09/2012  FINDINGS: Increased interstitial markings. No frank interstitial edema. No pleural effusion or pneumothorax.  Heart is normal in size. Postsurgical changes related to prior CABG. Coronary stent.  Visualized osseous structures are within normal limits.  IMPRESSION: No evidence of acute cardiopulmonary disease.   Electronically Signed   By: Charline Bills M.D.   On: 01/07/2013 16:42    EKG Interpretation     Ventricular Rate:  73 PR Interval:  156 QRS Duration: 100 QT Interval:  372 QTC Calculation: 409 R Axis:   72 Text Interpretation:  Sinus rhythm with occasional Premature ventricular complexes Marked ST abnormality, possible lateral subendocardial injury Abnormal ECG When compared with ECG of 10/09/2012, No significant change was found            MDM   [name] is a 62 year old Caucasian female with past medical history of MI and CABG in 1995 comes emergency department today with substernal chest pain. Physical exam as above. Pain is concerning for a unstable angina. Initial workup included an i-STAT troponin, CBC, CMP, chest x-ray, and EKG. Initial EKG was unchanged from old EKGs. CXR unremarkable doubt pneumonia or pneumothorax.  Initial glucose was elevated to 572 anion gap was 12 doubt DKA.  She was treated with 5 units of regular insulin and her glucose decreased to 320.  [name] initially denied chest pain while she was in the emergency department. However during her evaluation she developed severe  substernal chest pain similar to old and lives repeat EKG was obtained which demonstrated ST depressions in the lateral leads which were new compared to previous EKG. Troponin at that time was elevated to 0.19. This is concerning for an unsteady [name] was started on a heparin drip and a nitro drip for pain. Is felt to require admission to the hospital for an unsteady. Cardiology was consulted for admission. [name] was admitted to the cardiology service in stable condition. Labs and imaging reviewed by myself and considered and medical decision-making. Imaging was interpreted radiology. Care was discussed with the attending Dr. Preston Fleeting.  1. NSTEMI (non-ST elevated myocardial infarction)   2. Chest pain   3. DM (diabetes mellitus), secondary uncontrolled       Bethann Berkshire, MD  01/08/13 0110 

## 2013-01-07 NOTE — Telephone Encounter (Signed)
Pt at [facility] ED. 

## 2013-01-08 ENCOUNTER — Encounter (HOSPITAL_COMMUNITY)
Admission: EM | Disposition: A | Discharge status: Home / Self Care | Payer: Medicare Other | Source: Home / Self Care | Attending: Cardiovascular Disease

## 2013-01-08 DIAGNOSIS — I25810 Atherosclerosis of coronary artery bypass graft(s) without angina pectoris: Secondary | ICD-10-CM

## 2013-01-08 HISTORY — PX: PERCUTANEOUS STENT INTERVENTION: SHX5500

## 2013-01-08 HISTORY — PX: LEFT HEART CATHETERIZATION WITH CORONARY/GRAFT ANGIOGRAM: SHX5450

## 2013-01-08 LAB — CBC
HCT: 30.3 % — ABNORMAL LOW (ref 36.0–46.0)
Hemoglobin: 11 g/dL — ABNORMAL LOW (ref 12.0–15.0)
MCH: 31.6 pg (ref 26.0–34.0)
MCHC: 36.3 g/dL — ABNORMAL HIGH (ref 30.0–36.0)
MCV: 87.1 fL (ref 78.0–100.0)

## 2013-01-08 LAB — POCT ACTIVATED CLOTTING TIME
Activated Clotting Time: 160 seconds
Activated Clotting Time: 386 seconds

## 2013-01-08 LAB — BASIC METABOLIC PANEL
BUN: 22 mg/dL (ref 6–23)
Creatinine, Ser: 0.84 mg/dL (ref 0.50–1.10)
GFR calc Af Amer: 85 mL/min — ABNORMAL LOW (ref >90)
GFR calc non Af Amer: 74 mL/min — ABNORMAL LOW (ref >90)
Glucose, Bld: 295 mg/dL — ABNORMAL HIGH (ref 70–99)
Sodium: 134 mEq/L — ABNORMAL LOW (ref 135–145)

## 2013-01-08 LAB — PROTIME-INR: INR: 0.99 (ref 0.00–1.49)

## 2013-01-08 LAB — TROPONIN I: Troponin I: 0.42 ng/mL (ref <0.30)

## 2013-01-08 LAB — HEPARIN LEVEL (UNFRACTIONATED): Heparin Unfractionated: 0.31 IU/mL (ref 0.30–0.70)

## 2013-01-08 LAB — GLUCOSE, CAPILLARY
Glucose-Capillary: 284 mg/dL — ABNORMAL HIGH (ref 70–99)
Glucose-Capillary: 95 mg/dL (ref 70–99)
Glucose-Capillary: 267 mg/dL — ABNORMAL HIGH (ref 70–99)
Glucose-Capillary: 284 mg/dL — ABNORMAL HIGH (ref 70–99)

## 2013-01-08 LAB — HEMOGLOBIN A1C: Mean Plasma Glucose: 246 mg/dL — ABNORMAL HIGH (ref <117)

## 2013-01-08 SURGERY — LEFT HEART CATHETERIZATION WITH CORONARY/GRAFT ANGIOGRAM
Anesthesia: LOCAL

## 2013-01-08 MED ORDER — SODIUM CHLORIDE 0.9 % IV SOLN
0.2500 mg/kg/h | INTRAVENOUS | Status: AC
  Administered 2013-01-08: 0.25 mg/kg/h via INTRAVENOUS

## 2013-01-08 MED ORDER — ONDANSETRON HCL 4 MG/2ML IJ SOLN: 4.0000 mg | Freq: Four times a day (QID) | INTRAMUSCULAR | Status: DC | PRN

## 2013-01-08 MED ORDER — ACETAMINOPHEN 325 MG PO TABS: 650.0000 mg | ORAL_TABLET | ORAL | Status: DC | PRN

## 2013-01-08 MED ORDER — CLOPIDOGREL BISULFATE 75 MG PO TABS
ORAL_TABLET | ORAL | Status: AC
  Filled 2013-01-08: qty 4

## 2013-01-08 MED ORDER — MIDAZOLAM HCL 2 MG/2ML IJ SOLN
INTRAMUSCULAR | Status: AC
  Filled 2013-01-08: qty 2

## 2013-01-08 MED ORDER — SODIUM CHLORIDE 0.9 % IV SOLN
INTRAVENOUS | Status: AC
  Administered 2013-01-08 (×2): via INTRAVENOUS

## 2013-01-08 MED ORDER — HEPARIN (PORCINE) IN NACL 2-0.9 UNIT/ML-% IJ SOLN
INTRAMUSCULAR | Status: AC
  Filled 2013-01-08: qty 1000

## 2013-01-08 MED ORDER — ASPIRIN 81 MG PO CHEW
81.0000 mg | CHEWABLE_TABLET | Freq: Every day | ORAL | Status: DC
  Administered 2013-01-08 – 2013-01-09 (×2): 81 mg via ORAL
  Filled 2013-01-08 (×2): qty 1

## 2013-01-08 MED ORDER — MORPHINE SULFATE 2 MG/ML IJ SOLN
1.0000 mg | INTRAMUSCULAR | Status: DC | PRN
  Administered 2013-01-08 – 2013-01-09 (×4): 1 mg via INTRAVENOUS
  Administered 2013-01-09: 2 mg via INTRAVENOUS
  Administered 2013-01-09: 1 mg via INTRAVENOUS
  Administered 2013-01-09: 2 mg via INTRAVENOUS
  Filled 2013-01-08 (×6): qty 1

## 2013-01-08 MED ORDER — FENTANYL CITRATE 0.05 MG/ML IJ SOLN
INTRAMUSCULAR | Status: AC
  Filled 2013-01-08: qty 2

## 2013-01-08 MED ORDER — ASPIRIN 81 MG PO CHEW
CHEWABLE_TABLET | ORAL | Status: AC
  Filled 2013-01-08: qty 4

## 2013-01-08 MED ORDER — CLOPIDOGREL BISULFATE 75 MG PO TABS: 75.0000 mg | ORAL_TABLET | Freq: Every day | ORAL | Status: DC

## 2013-01-08 MED ORDER — NITROGLYCERIN 0.2 MG/ML ON CALL CATH LAB
INTRAVENOUS | Status: AC
  Filled 2013-01-08: qty 1

## 2013-01-08 MED ORDER — BIVALIRUDIN 250 MG IV SOLR
INTRAVENOUS | Status: AC
  Filled 2013-01-08: qty 250

## 2013-01-08 MED ORDER — LIDOCAINE HCL (PF) 1 % IJ SOLN
INTRAMUSCULAR | Status: AC
  Filled 2013-01-08: qty 30

## 2013-01-08 MED ORDER — CLOPIDOGREL BISULFATE 300 MG PO TABS
ORAL_TABLET | ORAL | Status: AC
  Filled 2013-01-08: qty 1

## 2013-01-08 NOTE — Interval H&P Note (Signed)
Cath Lab Visit (complete for each Cath Lab visit)  Clinical Evaluation Leading to the Procedure:   ACS: yes  Non-ACS:    Anginal Classification: CCS IV  Anti-ischemic medical therapy: Maximal Therapy (2 or more classes of medications)  Non-Invasive Test Results: No non-invasive testing performed  Prior CABG: Previous CABG      History and Physical Interval Note:  01/08/2013 9:19 AM  [name]  has presented today for surgery, with the diagnosis of cp  The various methods of treatment have been discussed with the patient and family. After consideration of risks, benefits and other options for treatment, the patient has consented to  Procedure(s): LEFT HEART CATHETERIZATION WITH CORONARY/GRAFT ANGIOGRAM (N/A) as a surgical intervention .  The patient's history has been reviewed, patient examined, no change in status, stable for surgery.  I have reviewed the patient's chart and labs.  Questions were answered to the patient's satisfaction.     Runell Gess

## 2013-01-08 NOTE — Progress Notes (Signed)
Inpatient Diabetes Program Recommendations  AACE/ADA: New Consensus Statement on Inpatient Glycemic Control (2013)  Target Ranges:  Prepandial:   less than 140 mg/dL      Peak postprandial:   less than 180 mg/dL (1-2 hours)      Critically ill patients:  140 - 180 mg/dL   Patient refused Lantus last night per [facility] charting.  Fasting CBG=284 this morning. Thank you  Piedad Climes BSN, RN,CDE Inpatient Diabetes Coordinator [phone] (team pager)

## 2013-01-08 NOTE — CV Procedure (Signed)
[name] is a 62 y.o. female    [id number] LOCATION:  FACILITY: [facility]  PHYSICIAN: Nanetta Batty, M.D. Jun 05, 1950   DATE OF PROCEDURE:  01/08/2013  DATE OF DISCHARGE:     CARDIAC CATHETERIZATION     History obtained from chart review.The patient is a 62 y/o female, previously followed by Dr. Alanda [name], who presents with a complaint of chest pain. Her cardiac history is significant for CAD, s/p CABG in 1995 with a LIMA to LAD, saphenous vein graft to OM1, saphenous vein graft to a diagonal, and saphenous vein graft to the right coronary artery. Her last LHC was in 2012 for evaluation of chest pain. It was performed by Dr. Rennis [name]. She was found to have patent grafts. 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 She was started on BID Ranexa. Her history is also significant for polymyalgia rheumatica.  She presented to the ER today with a complaint of severe substernal chest pressure, off and on for the last 3 days. It is described as pressure/tight pain with radiation down her arms and back. No relief with SL NTG. Her pain currently is 7/10.  Telemetry shows ST depressions. POC troponin in the ER is elevated at 0.19. Because of ongoing chest pain with nonspecific EKG changes and positive enzymes on IV nitroglycerin and heparin it was decided to bring the patient more expeditiously to the Cath Lab to define her anatomy.    PROCEDURE DESCRIPTION:   The patient was brought to the second floor [facility] lab in the postabsorptive state. She was premedicated with Valium 5 mg by mouth, IV Versed and fentanyl. Her right groinwas prepped and shaved in usual sterile fashion. Xylocaine 1% was used for local anesthesia. A 6 French sheath was inserted into the right common femoral artery using standard Seldinger technique. 5 French right and left Judkins diagnostic catheters along with a 5 French pigtail  catheter were used for selective coronary angiography, left ventriculography, selective vein graft angiography, selective LIMA angiography. Visipaque dye was used for the entirety of the case. Retrograde aorta, left ventricular and pullback pressures were recorded.   HEMODYNAMICS:    AO SYSTOLIC/AO DIASTOLIC: 161/57   LV SYSTOLIC/LV DIASTOLIC: 158/24  ANGIOGRAPHIC RESULTS:   1. Left main; 20-30%  2. LAD; occluded near the ostium 3. Left circumflex; occluded in the midportion within  a previously placed stent.  4. Right coronary artery; occluded proximally. There was grade 2 left-to-right collaterals from injection of the diagonal graft and LIMA. 5.LIMA TO LAD; widely patent 6. SVG TO diagonal branch had a 95% distal shaft stenosis     SVG TO T. High obtuse marginal branch had a 50-60% proximal stenosis     SVG TO RCA was occluded within the previously placed that in the midportion of the graft 7. Left ventriculography; RAO left ventriculogram was performed using  25 mL of Visipaque dye at 12 mL/second. The overall LVEF estimated  35-40% with moderate inferobasal hypokinesia new since the previous LV gram performed 2 years ago   IMPRESSION:[name] has progression of her graft disease. All native vessels are occluded. The vein graft to the RCA is occluded as well which is a new finding. The vein graft to the diagonal branch had a 95% stenosis at the site of a 30% stenosis 2 years ago. She does have a new wall motion abnormality and inferior wall probably related to the occlusion  of the RCA vein graft. Will proceed with direct PCI stenting of her diagonal vein graft with a drug-eluting stent, and Angiomax. She was already on aspirin and Plavix.   Procedure description: The existing 5 French sheath was exchanged over a wire for a 6 [place] sheath. The patient received Angiomax bolus with an ACT of 386. She was already on Plavix 75 mg by mouth daily and received 300 mg additional load. Total  contrast administered the patient was 110 cc.  Using a 6 [place] JR 4 guide catheter along with an 014/190 cm long Pro Water  guidewire and a 3.0 mm x 15 mm long Xpedition drug alluding stent primary stenting was performed. The stent was deployed at 16 atmospheres (3.27 mm) resulting in reduction of a 95% diagonal branch SVG stenosis to 0% residual. 200 mcg of intra-graft nitroglycerin was administered. There was TIMI-3 flow without evidence of distal embolization. The guidewire and catheter were removed. The sheath was sewn securely in place. Angiomax continue at reduced dose for 4 hours.  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 She does have a occluded RCA vein graft which is new since her previous cath as well and has grade 2 left-to-right collaterals with decline in LV function as a result. Continue medical therapy of her residual CAD will be recommended. The sheath will be removed several hours after Intimax is discontinued. The patient will be hydrated and treated with DAPT  therapy after that.  Erika Gess MD, [facility] 01/08/2013 10:39 AM

## 2013-01-08 NOTE — Progress Notes (Signed)
Pt's sheath was removed at 16:55. Sheath was intact upon removal and moderate pressure was applied for 20 mins.Pt's V/S remained stable during and after removal. Site assessment is scale zero. Pt was educated on post sheath removal and demonstarted understanding.

## 2013-01-08 NOTE — Progress Notes (Signed)
ANTICOAGULATION CONSULT NOTE - Follow Up Consult  Pharmacy Consult for heparin and Integrilin Indication: ACS  Labs:  Recent Labs  01/07/13 1559 01/07/13 2200 01/08/13 0425  HGB 12.2  --  11.0*  HCT 34.0*  --  30.3*  PLT 333  --  306  LABPROT  --   --  12.9  INR  --   --  0.99  HEPARINUNFRC  --   --  0.31  CREATININE 0.94  --   --   TROPONINI  --  <0.30  --     Assessment/Plan:  62yo female therapeutic on heparin with initial dosing for ACS, also with Integrilin running; Hgb down one point, Plt stable.  Will continue gtt at current rate and confirm stable with additional level.  Vernard Gambles, PharmD, BCPS  01/08/2013,6:11 AM

## 2013-01-08 NOTE — Care Management Note (Signed)
    Page 1 of 1   01/08/2013     8:49:27 AM   CARE MANAGEMENT NOTE 01/08/2013  Patient:  ISELLA, SLATTEN A   Account Number:  [id number]  Date Initiated:  01/08/2013  Documentation initiated by:  Junius Creamer  Subjective/Objective Assessment:   adm w mi     Action/Plan:   lives w friend, pcpdr ed [employer]   Anticipated DC Date:     Anticipated DC Plan:           Choice offered to / List presented to:             Status of service:   Medicare Important Message given?   (If response is "NO", the following Medicare IM given date fields will be blank) Date Medicare IM given:   Date Additional Medicare IM given:    Discharge Disposition:    Per UR Regulation:  Reviewed for med. necessity/level of care/duration of stay  If discussed at Long Length of Stay Meetings, dates discussed:    Comments:

## 2013-01-08 NOTE — Progress Notes (Signed)
Subjective: Having rheumatica pain all over and CP when she exerts herself.   Objective: Vital signs in last 24 hours: Temp:  [97.6 F (36.4 C)-98.7 F (37.1 C)] 97.8 F (36.6 C) (10/28 0745) Pulse Rate:  [45-84] 63 (10/28 0700) Resp:  [10-26] 19 (10/28 0700) BP: (90-172)/(35-93) 127/39 mmHg (10/28 0700) SpO2:  [93 %-100 %] 99 % (10/28 0400) Weight:  [150 lb 2.1 oz (68.1 kg)-153 lb 7 oz (69.6 kg)] 150 lb 2.1 oz (68.1 kg) (10/28 0400)    Intake/Output from previous day: 10/27 0701 - 10/28 0700 In: 1014.4 [I.V.:1014.4] Out: 1800 [Urine:1800] Intake/Output this shift:    Medications Current Facility-Administered Medications  Medication Dose Route Frequency Provider Last Rate Last Dose  . 0.9 %  sodium chloride infusion   Intravenous Continuous Brittainy Simmons, PA-C 100 mL/hr at 01/07/13 2246    . 0.9 %  sodium chloride infusion  250 mL Intravenous PRN Brittainy Simmons, PA-C      . acetaminophen (TYLENOL) tablet 650 mg  650 mg Oral Q4H PRN Brittainy Simmons, PA-C      . ALPRAZolam Prudy Feeler) tablet 1 mg  1 mg Oral QID PRN Robbie Lis, PA-C   1 mg at 01/08/13 0616  . amitriptyline (ELAVIL) tablet 100 mg  100 mg Oral QHS Brittainy Simmons, PA-C   100 mg at 01/07/13 2303  . amLODipine (NORVASC) tablet 2.5 mg  2.5 mg Oral Daily Brittainy Simmons, PA-C      . aspirin chewable tablet 81 mg  81 mg Oral BID Brittainy Simmons, PA-C      . carvedilol (COREG) tablet 25 mg  25 mg Oral BID WC Brittainy Simmons, PA-C      . clopidogrel (PLAVIX) tablet 75 mg  75 mg Oral Q breakfast Brittainy Simmons, PA-C      . eptifibatide (INTEGRILIN) 75 mg / 100 mL infusion  2 mcg/kg/min Intravenous Continuous Thuy Dien Dang, RPH 11.1 mL/hr at 01/08/13 0600 2.008 mcg/kg/min at 01/08/13 0600  . fentaNYL (DURAGESIC - dosed mcg/hr) patch 25 mcg  25 mcg Transdermal Q72H Brittainy Simmons, PA-C   25 mcg at 01/07/13 2300  . heparin ADULT infusion 100 units/mL (25000 units/250 mL)  800 Units/hr Intravenous  Continuous Lennon Alstrom, [facility] 8 mL/hr at 01/07/13 2130 800 Units/hr at 01/07/13 2130  . insulin aspart (novoLOG) injection 0-20 Units  0-20 Units Subcutaneous TID WC Brittainy Simmons, PA-C      . insulin glargine (LANTUS) injection 24 Units  24 Units Subcutaneous QHS Brittainy Simmons, PA-C      . iron polysaccharides (NIFEREX) capsule 150 mg  150 mg Oral Daily Brittainy Simmons, PA-C      . latanoprost (XALATAN) 0.005 % ophthalmic solution 1 drop  1 drop Both Eyes QHS Brittainy Simmons, PA-C      . nitroGLYCERIN (NITROSTAT) SL tablet 0.4 mg  0.4 mg Sublingual Q5 min PRN Bethann Berkshire, MD   0.4 mg at 01/07/13 1750  . nitroGLYCERIN 0.2 mg/mL in dextrose 5 % infusion  2-200 mcg/min Intravenous Titrated Brittainy Simmons, PA-C 9 mL/hr at 01/08/13 0305 30 mcg/min at 01/08/13 0305  . ondansetron (ZOFRAN) injection 4 mg  4 mg Intravenous Q6H PRN Brittainy Simmons, PA-C      . pantoprazole (PROTONIX) EC tablet 40 mg  40 mg Oral Daily Brittainy Simmons, PA-C      . predniSONE (DELTASONE) tablet 10 mg  10 mg Oral Q breakfast Brittainy Simmons, PA-C      . sertraline (ZOLOFT) tablet 50 mg  50  mg Oral Daily Brittainy Simmons, PA-C      . sodium chloride 0.9 % injection 3 mL  3 mL Intravenous Q12H Brittainy Simmons, PA-C   3 mL at 01/07/13 2253  . sodium chloride 0.9 % injection 3 mL  3 mL Intravenous PRN Brittainy Simmons, PA-C      . timolol (TIMOPTIC) 0.25 % ophthalmic solution 1 drop  1 drop Both Eyes BID Brittainy Simmons, PA-C   1 drop at 01/07/13 2301    PE: General appearance: alert, cooperative and no distress Lungs: clear to auscultation bilaterally Heart: regular rate and rhythm, S1, S2 normal, no murmur, click, rub or gallop Extremities: No LEE Pulses: 2+ and symmetric Skin: Warm and dry Neurologic: Grossly normal  Lab Results:   Recent Labs  01/07/13 1559 01/08/13 0425  WBC 9.8 11.3*  HGB 12.2 11.0*  HCT 34.0* 30.3*  PLT 333 306   BMET  Recent Labs  01/07/13 1559  01/08/13 0425  NA 127* 134*  K 4.2 3.6  CL 86* 94*  CO2 29 30  GLUCOSE 572* 295*  BUN 28* 22  CREATININE 0.94 0.84  CALCIUM 9.6 9.1   PT/INR  Recent Labs  01/08/13 0425  LABPROT 12.9  INR 0.99   Lipid Panel     Component Value Date/Time   CHOL  Value: 181        ATP III CLASSIFICATION:  <200     mg/dL   Desirable  562-130  mg/dL   Borderline High  >=865    mg/dL   High        09/19/4694 0855   TRIG 117 05/20/2010 0855   HDL 89 05/20/2010 0855   CHOLHDL 2.0 05/20/2010 0855   VLDL 23 05/20/2010 0855   LDLCALC  Value: 69        Total Cholesterol/HDL:CHD Risk Coronary Heart Disease Risk Table                     Men   Women  1/2 Average Risk   3.4   3.3  Average Risk       5.0   4.4  2 X Average Risk   9.6   7.1  3 X Average Risk  23.4   11.0        Use the calculated Patient Ratio above and the CHD Risk Table to determine the patient's CHD Risk.        ATP III CLASSIFICATION (LDL):  <100     mg/dL   Optimal  295-284  mg/dL   Near or Above                    Optimal  130-159  mg/dL   Borderline  132-440  mg/dL   High  >102     mg/dL   Very High 09/12/5364 4403    Cardiac Panel (last 3 results)  Recent Labs  01/07/13 2200 01/08/13 0425  TROPONINI <0.30 0.42*    Assessment/Plan   Active Problems:   NSTEMI (non-ST elevated myocardial infarction)  Plan:  Continues to have CP.  Troponin 0.42.  BP and HR stable.  LHC this morning.   Hyperglycemia.  Glucose better and trending down but still elevated.  Check A1C.  NPO currently.  Reassess CBG after cath.  On lantus and SS.   LOS: 1 day    HAGER, BRYAN 01/08/2013 8:05 AM  Agree with note written by Jones Skene PAC  Pt well known to me with  CAD s/p remote CABG with accelerated/USA, ACS, NSTEMI with NSSTTWC, mildly + TRop and on going CP on IV NTG and Hep for cath this AM.   Runell Gess 01/08/2013 9:04 AM

## 2013-01-09 ENCOUNTER — Encounter (HOSPITAL_COMMUNITY): Payer: Self-pay | Admitting: Neurology

## 2013-01-09 DIAGNOSIS — I2510 Atherosclerotic heart disease of native coronary artery without angina pectoris: Secondary | ICD-10-CM | POA: Diagnosis present

## 2013-01-09 DIAGNOSIS — I10 Essential (primary) hypertension: Secondary | ICD-10-CM | POA: Diagnosis present

## 2013-01-09 DIAGNOSIS — M353 Polymyalgia rheumatica: Secondary | ICD-10-CM | POA: Diagnosis present

## 2013-01-09 DIAGNOSIS — E1065 Type 1 diabetes mellitus with hyperglycemia: Secondary | ICD-10-CM | POA: Diagnosis present

## 2013-01-09 LAB — CBC
HCT: 30.6 % — ABNORMAL LOW (ref 36.0–46.0)
Hemoglobin: 10.8 g/dL — ABNORMAL LOW (ref 12.0–15.0)
MCHC: 35.3 g/dL (ref 30.0–36.0)
Platelets: 285 10*3/uL (ref 150–400)
WBC: 12.9 10*3/uL — ABNORMAL HIGH (ref 4.0–10.5)

## 2013-01-09 LAB — BASIC METABOLIC PANEL
BUN: 11 mg/dL (ref 6–23)
Chloride: 103 mEq/L (ref 96–112)
GFR calc Af Amer: >90 mL/min (ref >90)
GFR calc non Af Amer: >90 mL/min (ref >90)
Glucose, Bld: 67 mg/dL — ABNORMAL LOW (ref 70–99)
Potassium: 3.2 mEq/L — ABNORMAL LOW (ref 3.5–5.1)
Sodium: 140 mEq/L (ref 135–145)

## 2013-01-09 LAB — GLUCOSE, CAPILLARY
Glucose-Capillary: 64 mg/dL — ABNORMAL LOW (ref 70–99)
Glucose-Capillary: 156 mg/dL — ABNORMAL HIGH (ref 70–99)
Glucose-Capillary: 348 mg/dL — ABNORMAL HIGH (ref 70–99)

## 2013-01-09 MED ORDER — POTASSIUM CHLORIDE CRYS ER 20 MEQ PO TBCR
40.0000 meq | EXTENDED_RELEASE_TABLET | Freq: Once | ORAL | Status: AC
  Administered 2013-01-09: 40 meq via ORAL
  Filled 2013-01-09: qty 2

## 2013-01-09 MED ORDER — OXYCODONE-ACETAMINOPHEN 5-325 MG PO TABS
2.0000 | ORAL_TABLET | Freq: Four times a day (QID) | ORAL | Status: DC | PRN
  Administered 2013-01-09 (×2): 2 via ORAL
  Filled 2013-01-09 (×2): qty 2

## 2013-01-09 MED ORDER — RANOLAZINE ER 500 MG PO TB12: 500.0000 mg | ORAL_TABLET | Freq: Two times a day (BID) | ORAL | Status: DC

## 2013-01-09 MED ORDER — POLYETHYLENE GLYCOL 3350 17 G PO PACK
17.0000 g | PACK | Freq: Every day | ORAL | Status: DC
  Administered 2013-01-09: 17 g via ORAL
  Filled 2013-01-09: qty 1

## 2013-01-09 MED ORDER — ALUM & MAG HYDROXIDE-SIMETH 200-200-20 MG/5ML PO SUSP
30.0000 mL | ORAL | Status: DC | PRN
  Administered 2013-01-09: 30 mL via ORAL
  Filled 2013-01-09: qty 30

## 2013-01-09 MED FILL — Sodium Chloride IV Soln 0.9%: INTRAVENOUS | Qty: 50 | Status: AC

## 2013-01-09 NOTE — Progress Notes (Signed)
Inpatient Diabetes Program Recommendations  AACE/ADA: New Consensus Statement on Inpatient Glycemic Control (2013)  Target Ranges:  Prepandial:   less than 140 mg/dL      Peak postprandial:   less than 180 mg/dL (1-2 hours)      Critically ill patients:  140 - 180 mg/dL  Results for DARICA, GOREN (MRN [id number]) as of 01/09/2013 09:56  Ref. Range 01/08/2013 22:02 01/09/2013 05:10 01/09/2013 05:26 01/09/2013 05:45 01/09/2013 08:08  Glucose-Capillary Latest Range: 70-99 mg/dL 409 (H) 45 (L) 64 (L) 92 145 (H)   Inpatient Diabetes Program Recommendations Insulin - Basal: decrease Lantus to 20 units  Correction (SSI): decrease to moderate scale  HgbA1C: =10.2 will need follow up with primary MD for management Thank you  Piedad Climes BSN, RN,CDE Inpatient Diabetes Coordinator [phone] (team pager)

## 2013-01-09 NOTE — Progress Notes (Signed)
CARDIAC REHAB PHASE I   PRE:  Rate/Rhythm: 62 SR  BP:  Supine:   Sitting: 139/45  Standing:    SaO2:   MODE:  Ambulation: 248 ft   POST:  Rate/Rhythm: 72 SrR  BP:  Supine:   Sitting: 147/80  Standing:    SaO2:  0930-1100 Assisted X 1 and used walker to ambulate. Gait steady with walker. VS stable.Pt denies any cp or SOB with walking. She does tire easily due to her chronic disease. Pt was able to walk 248 feet. Back to recliner after walk with call light in reach and significant other present. Completed MI and stent education with them. She has a difficult time understanding due to the hearing loss. Her significant other is very supportive and helpful with her care. She is not able to do Outpt. CRP due to physical limitations.  Melina Copa RN 01/09/2013 10:57 AM

## 2013-01-09 NOTE — Progress Notes (Signed)
Subjective: Complains of pain all over.  Objective: Vital signs in last 24 hours: Temp:  [97.5 F (36.4 C)-98.8 F (37.1 C)] 97.7 F (36.5 C) (10/29 0809) Pulse Rate:  [57-69] 63 (10/29 0347) Resp:  [11-18] 13 (10/29 0347) BP: (81-158)/(30-124) 114/51 mmHg (10/29 0809) SpO2:  [94 %-100 %] 95 % (10/29 0809) Last BM Date: 01/05/13  Intake/Output from previous day: 10/28 0701 - 10/29 0700 In: 1165 [P.O.:480; I.V.:685] Out: 1175 [Urine:1175] Intake/Output this shift:    Medications Current Facility-Administered Medications  Medication Dose Route Frequency Provider Last Rate Last Dose  . acetaminophen (TYLENOL) tablet 650 mg  650 mg Oral Q4H PRN Runell Gess, MD      . ALPRAZolam Prudy Feeler) tablet 1 mg  1 mg Oral QID PRN Robbie Lis, PA-C   1 mg at 01/09/13 0752  . alum & mag hydroxide-simeth (MAALOX/MYLANTA) 200-200-20 MG/5ML suspension 30 mL  30 mL Oral Q4H PRN Lennette Bihari, MD   30 mL at 01/09/13 0009  . amitriptyline (ELAVIL) tablet 100 mg  100 mg Oral QHS Brittainy Simmons, PA-C   100 mg at 01/08/13 2240  . amLODipine (NORVASC) tablet 2.5 mg  2.5 mg Oral Daily Brittainy Simmons, PA-C   2.5 mg at 01/08/13 1109  . aspirin chewable tablet 81 mg  81 mg Oral Daily Runell Gess, MD   81 mg at 01/08/13 1109  . carvedilol (COREG) tablet 25 mg  25 mg Oral BID WC Brittainy Simmons, PA-C   25 mg at 01/08/13 1633  . clopidogrel (PLAVIX) tablet 75 mg  75 mg Oral Q breakfast Brittainy Simmons, PA-C   75 mg at 01/08/13 0811  . fentaNYL (DURAGESIC - dosed mcg/hr) patch 25 mcg  25 mcg Transdermal Q72H Brittainy Simmons, PA-C   25 mcg at 01/07/13 2300  . insulin aspart (novoLOG) injection 0-20 Units  0-20 Units Subcutaneous TID WC Brittainy Simmons, PA-C   11 Units at 01/08/13 1658  . insulin glargine (LANTUS) injection 24 Units  24 Units Subcutaneous QHS Brittainy Simmons, PA-C   24 Units at 01/08/13 2241  . iron polysaccharides (NIFEREX) capsule 150 mg  150 mg Oral Daily  Brittainy Simmons, PA-C   150 mg at 01/08/13 1109  . latanoprost (XALATAN) 0.005 % ophthalmic solution 1 drop  1 drop Both Eyes QHS Brittainy Simmons, PA-C   1 drop at 01/08/13 2240  . morphine 2 MG/ML injection 1 mg  1 mg Intravenous Q1H PRN Runell Gess, MD   2 mg at 01/09/13 0752  . nitroGLYCERIN (NITROSTAT) SL tablet 0.4 mg  0.4 mg Sublingual Q5 min PRN Bethann Berkshire, MD   0.4 mg at 01/07/13 1750  . ondansetron (ZOFRAN) injection 4 mg  4 mg Intravenous Q6H PRN Runell Gess, MD      . pantoprazole (PROTONIX) EC tablet 40 mg  40 mg Oral Daily Brittainy Simmons, PA-C   40 mg at 01/08/13 1109  . potassium chloride SA (K-DUR,KLOR-CON) CR tablet 40 mEq  40 mEq Oral Once Wilburt Finlay, PA-C      . predniSONE (DELTASONE) tablet 10 mg  10 mg Oral Q breakfast Brittainy Simmons, PA-C   10 mg at 01/08/13 1610  . sertraline (ZOLOFT) tablet 50 mg  50 mg Oral Daily Brittainy Simmons, PA-C   50 mg at 01/08/13 1633  . timolol (TIMOPTIC) 0.25 % ophthalmic solution 1 drop  1 drop Both Eyes BID Robbie Lis, PA-C   1 drop at 01/08/13 2241    PE: General  appearance: alert, cooperative and no distress Lungs: clear to auscultation bilaterally Heart: regular rate and rhythm, S1, S2 normal, no murmur, click, rub or gallop Extremities: No LEE Pulses: 2+ and symmetric Skin: Warm and dry Neurologic: Grossly normal  Groin site stable.  Lab Results:   Recent Labs  01/07/13 1559 01/08/13 0425 01/09/13 0410  WBC 9.8 11.3* 12.9*  HGB 12.2 11.0* 10.8*  HCT 34.0* 30.3* 30.6*  PLT 333 306 285   BMET  Recent Labs  01/07/13 1559 01/08/13 0425 01/09/13 0410  NA 127* 134* 140  K 4.2 3.6 3.2*  CL 86* 94* 103  CO2 29 30 28   GLUCOSE 572* 295* 67*  BUN 28* 22 11  CREATININE 0.94 0.84 0.57  CALCIUM 9.6 9.1 8.8   PT/INR  Recent Labs  01/08/13 0425  LABPROT 12.9  INR 0.99     Assessment/Plan   Active Problems:   NSTEMI (non-ST elevated myocardial infarction)  Plan:  SP LHC revealing  the vein graft to the diagonal branch had a 95% stenosis.  PCI was completed with and Xpedition DES.  The RCA vein graft is occluded.  ASA, plavix.   Hypoglycemia this morning(improved), however, she is a poorly controlled diabetic with A1C of 10.2 and when she was admitted, her CBG was 572.   She needs nutritional consult with Diabetes educator/RD.  After discussing with patient it sounds as though she does NOT have a good understanding of her diabetes despite managing it for 45 years.  Her level of pain affects her mgt.  She needs OP follow up with Dr. Evlyn Kanner.   Pain:  Not well controlled.  Will add home percocet dosing.   Replacing K+.  DC home today    LOS: 2 days    HAGER, BRYAN 01/09/2013 8:17 AM    Patient seen and examined. Agree with assessment and plan. Pt feels better, s/p PCI to SVG to diagonal. Occluded graft to RCA is new. May benefit from the addition of Ranexa 500 mg bid and titrate. Pt wanting to go home today; will dc later today.   Lennette Bihari, MD, [facility] 01/09/2013 2:02 PM

## 2013-01-09 NOTE — Progress Notes (Signed)
Discharge instructions reviewed with Patient and significant other. Reviewed D/C medications and follow up appointments.  Both voice understanding to teaching.  To door via wheelchair.  Home via POV with friend driving.

## 2013-01-09 NOTE — Discharge Summary (Signed)
Physician Discharge Summary  Patient ID: [name] MRN: [id number] DOB/AGE: [birth year] 61 y.o.  Admit date: 01/07/2013 Discharge date: 01/09/2013  Admission Diagnoses:  NSTEMI, Hyperglycemia  Discharge Diagnoses:  Principal Problem:   NSTEMI (non-ST elevated myocardial infarction) Active Problems:   Diabetes mellitus type 1, uncontrolled, insulin dependent   Polymyalgia rheumatica   HTN (hypertension)   CAD (coronary artery disease)   Discharged Condition: stable  Hospital Course:  The patient is a 63 y/o female, previously followed by Dr. Alanda Amass, who presented with a complaint of chest pain. Her cardiac history is significant for CAD, s/p CABG in 1995 with a LIMA to LAD, saphenous vein graft to OM1, saphenous vein graft to a diagonal, and saphenous vein graft to the right coronary artery. Her last LHC was in 2012 for evaluation of chest pain. It was performed by Dr. Rennis Golden. She was found to have patent grafts. 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 She was started on BID Ranexa. Her history is also significant for polymyalgia rheumatica and diabetes mellitus.   She presented to the ER today with a complaint of severe substernal chest pressure, off and on for the last 3 days. It is described as pressure/tight pain with radiation down her arms and back. No relief with SL NTG. Her pain currently is 7/10.   Telemetry showed ST depressions. POC troponin in the ER is elevated at 0.19.   She was admitted and started on IV NTG, given MSO4 with improvement.  Blood sugar was 572.  She was given insulin.  Integrelin started.  Left heart cath revealed an RCA that was occluded proximally, 95% distal stenosis of the SVG to Diag(see full report below).  She underwent successful PCI and stenting of a high-grade diagonal branch SVG lesion.  She was treated with ASA and plavix.  She had a hypoglycemic episode which was  treated and improved to 92. Hgb A1C was 10.2. A long discussion was had with the patient regarding the need for follow up with Dr. Evlyn Kanner and the need for better diabetes mgt.  The diabetes coordinator was consulted.  Ranexa was started at 500mg  BID.  The patient was seen by Dr. Tresa Endo who felt she was stable for DC home.   Consults: Diabetes [place].  Significant Diagnostic Studies:  Coroary angiogram HEMODYNAMICS:  AO SYSTOLIC/AO DIASTOLIC: 161/57  LV SYSTOLIC/LV DIASTOLIC: 158/24  ANGIOGRAPHIC RESULTS:  1. Left main; 20-30%  2. LAD; occluded near the ostium  3. Left circumflex; occluded in the midportion within a previously placed stent.  4. Right coronary artery; occluded proximally. There was grade 2 left-to-right collaterals from injection of the diagonal graft and LIMA.  5.LIMA TO LAD; widely patent  6. SVG TO diagonal branch had a 95% distal shaft stenosis  SVG TO T. High obtuse marginal branch had a 50-60% proximal stenosis  SVG TO RCA was occluded within the previously placed that in the midportion of the graft  7. Left ventriculography; RAO left ventriculogram was performed using  25 mL of Visipaque dye at 12 mL/second. The overall LVEF estimated  35-40% with moderate inferobasal hypokinesia new since the previous LV gram performed 2 years ago  IMPRESSION:Ms. Digman has progression of her graft disease. All native vessels are occluded. The vein graft to the RCA is occluded as well which is a new finding. The vein graft to the diagonal branch had a 95% stenosis at the site  of a 30% stenosis 2 years ago. She does have a new wall motion abnormality and inferior wall probably related to the occlusion of the RCA vein graft. Will proceed with direct PCI stenting of her diagonal vein graft with a drug-eluting stent, and Angiomax. She was already on aspirin and Plavix.  Procedure description: The existing 5 French sheath was exchanged over a wire for a 6 [place] sheath. The patient received  Angiomax bolus with an ACT of 386. She was already on Plavix 75 mg by mouth daily and received 300 mg additional load. Total contrast administered the patient was 110 cc.  Using a 6 [place] JR 4 guide catheter along with an 014/190 cm long Pro Water guidewire and a 3.0 mm x 15 mm long Xpedition drug alluding stent primary stenting was performed. The stent was deployed at 16 atmospheres (3.27 mm) resulting in reduction of a 95% diagonal branch SVG stenosis to 0% residual. 200 mcg of intra-graft nitroglycerin was administered. There was TIMI-3 flow without evidence of distal embolization. The guidewire and catheter were removed. The sheath was sewn securely in place. Angiomax continue at reduced dose for 4 hours.  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 She does have a occluded RCA vein graft which is new since her previous cath as well and has grade 2 left-to-right collaterals with decline in LV function as a result. Continue medical therapy of her residual CAD will be recommended. The sheath will be removed several hours after Intimax is discontinued. The patient will be hydrated and treated with DAPT therapy after that.  Runell Gess MD, [facility]  01/08/2013  Treatments: See above  Discharge Exam: Blood pressure 125/46, pulse 50, temperature 97.8 F (36.6 C), temperature source Oral, resp. rate 18, height 5\' 4"  (1.626 m), weight 150 lb 2.1 oz (68.1 kg), SpO2 99.00%.   Disposition: 01-Home or Self Care      Discharge Orders   Future Appointments Provider Department Dept Phone   01/30/2013 9:00 AM Chrystie Nose, MD [facility] [phone]   Future Orders Complete By Expires   Diet - low sodium heart healthy  As directed    Discharge instructions  As directed    Comments:     No lifting more than a half gallon of milk or driving for  three days.   Increase activity slowly  As directed        Medication List    STOP taking these medications       doxycycline 100 MG capsule  Commonly known as:  VIBRAMYCIN     furosemide 40 MG tablet  Commonly known as:  LASIX     valsartan 40 MG tablet  Commonly known as:  DIOVAN      TAKE these medications       ALPRAZolam 1 MG tablet  Commonly known as:  XANAX  Take 1 mg by mouth 4 (four) times daily as needed for sleep or anxiety.     amitriptyline 100 MG tablet  Commonly known as:  ELAVIL  Take 100 mg by mouth at bedtime.     aspirin 81 MG chewable tablet  Chew 81 mg by mouth 2 (two) times daily. Two a day per patient     bimatoprost 0.01 % Soln  Commonly known as:  LUMIGAN  Place 1 drop into both eyes at bedtime.  CALCIUM + D PO  Take 1 tablet by mouth daily.     carvedilol 25 MG tablet  Commonly known as:  COREG  Take 25 mg by mouth 2 (two) times daily with a meal.     clopidogrel 75 MG tablet  Commonly known as:  PLAVIX  Take 1 tablet (75 mg total) by mouth daily.     diclofenac sodium 1 % Gel  Commonly known as:  VOLTAREN  Apply 4 g topically daily.     fentaNYL 25 MCG/HR patch  Commonly known as:  DURAGESIC - dosed mcg/hr  Place 1 patch onto the skin every 3 (three) days.     insulin glargine 100 UNIT/ML injection  Commonly known as:  LANTUS  Inject 24 Units into the skin at bedtime.     insulin lispro 100 UNIT/ML injection  Commonly known as:  HUMALOG  Inject 5-12 Units into the skin 3 (three) times daily before meals. 5-12 units per sliding scale     iron polysaccharides 150 MG capsule  Commonly known as:  NU-IRON  Take 1 capsule (150 mg total) by mouth daily.     isosorbide mononitrate 60 MG 24 hr tablet  Commonly known as:  IMDUR  Take 60 mg by mouth 2 (two) times daily.     nitroGLYCERIN 0.4 MG SL tablet  Commonly known as:  NITROSTAT  Place 0.4 mg under the tongue every 5 (five) minutes as needed for chest pain.      NORVASC 2.5 MG tablet  Generic drug:  amLODipine  Take 2.5 mg by mouth daily.     omeprazole 20 MG capsule  Commonly known as:  PRILOSEC  Take 20 mg by mouth daily.     oxyCODONE-acetaminophen 10-325 MG per tablet  Commonly known as:  PERCOCET  Take 1 tablet by mouth every 4 (four) hours as needed for pain.     predniSONE 10 MG tablet  Commonly known as:  DELTASONE  Take 10 mg by mouth daily.     ranolazine 500 MG 12 hr tablet  Commonly known as:  RANEXA  Take 1 tablet (500 mg total) by mouth 2 (two) times daily.     sertraline 50 MG tablet  Commonly known as:  ZOLOFT  Take 50 mg by mouth daily.     timolol 0.25 % ophthalmic solution  Commonly known as:  TIMOPTIC  Place 1 drop into both eyes 2 (two) times daily.     VITAMIN B 12 PO  Take 1 tablet by mouth daily.     VITAMIN D PO  Take 1 tablet by mouth daily.       Follow-up Information   Follow up with Chrystie Nose, MD On 01/30/2013. (9:00.)    Specialty:  Cardiology   Contact information:   [address] [place] [address] [phone]       Follow up with Julian Hy, MD. (Call for an appoint ASAP.)    Specialty:  Endocrinology   Contact information:   [address] [place] [address] [phone]       Signed: Wilburt Finlay 01/09/2013, 6:11 PM

## 2013-01-12 ENCOUNTER — Encounter: Payer: Self-pay | Admitting: Internal Medicine

## 2013-01-17 ENCOUNTER — Other Ambulatory Visit: Payer: Self-pay

## 2013-01-18 ENCOUNTER — Encounter: Payer: Self-pay | Admitting: *Deleted

## 2013-01-21 ENCOUNTER — Encounter: Payer: Self-pay | Admitting: Internal Medicine

## 2013-01-21 ENCOUNTER — Ambulatory Visit (INDEPENDENT_AMBULATORY_CARE_PROVIDER_SITE_OTHER): Payer: Medicare Other | Admitting: Internal Medicine

## 2013-01-21 VITALS — BP 118/60 | Ht 68.0 in | Wt 153.2 lb

## 2013-01-21 DIAGNOSIS — H903 Sensorineural hearing loss, bilateral: Secondary | ICD-10-CM | POA: Insufficient documentation

## 2013-01-21 DIAGNOSIS — I214 Non-ST elevation (NSTEMI) myocardial infarction: Secondary | ICD-10-CM

## 2013-01-21 DIAGNOSIS — E1149 Type 2 diabetes mellitus with other diabetic neurological complication: Secondary | ICD-10-CM

## 2013-01-21 DIAGNOSIS — M353 Polymyalgia rheumatica: Secondary | ICD-10-CM

## 2013-01-21 DIAGNOSIS — IMO0002 Reserved for concepts with insufficient information to code with codable children: Secondary | ICD-10-CM

## 2013-01-21 DIAGNOSIS — I2510 Atherosclerotic heart disease of native coronary artery without angina pectoris: Secondary | ICD-10-CM

## 2013-01-21 DIAGNOSIS — E1143 Type 2 diabetes mellitus with diabetic autonomic (poly)neuropathy: Secondary | ICD-10-CM | POA: Insufficient documentation

## 2013-01-21 DIAGNOSIS — E1065 Type 1 diabetes mellitus with hyperglycemia: Secondary | ICD-10-CM

## 2013-01-21 DIAGNOSIS — G909 Disorder of the autonomic nervous system, unspecified: Secondary | ICD-10-CM

## 2013-01-21 DIAGNOSIS — E113593 Type 2 diabetes mellitus with proliferative diabetic retinopathy without macular edema, bilateral: Secondary | ICD-10-CM | POA: Insufficient documentation

## 2013-01-21 DIAGNOSIS — I10 Essential (primary) hypertension: Secondary | ICD-10-CM

## 2013-01-21 NOTE — Patient Instructions (Signed)
Your physician wants you to follow-up in 4 months .  You will receive a reminder letter in the mail two months in advance. If you don't receive a letter, please call our office to schedule the follow-up appointment.  Continue current medications

## 2013-01-21 NOTE — Progress Notes (Addendum)
OFFICE NOTE  Chief Complaint:  Hospital follow-up, not as fatigued, BP has improved  Primary Care Physician: Fredirick Maudlin, MD  HPI:  [name] is a 62 y/o female, previously followed by Dr. Alanda Amass, who presented with a complaint of chest pain. Her cardiac history is significant for CAD, s/p CABG in 1995 with a LIMA to LAD, saphenous vein graft to OM1, saphenous vein graft to a diagonal, and saphenous vein graft to the right coronary artery. Her last LHC was in 2012 for evaluation of chest pain. It was performed by Dr. Rennis Golden. She was found to have patent grafts. 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 She was started on BID Ranexa. Her history is also significant for polymyalgia rheumatica and diabetes mellitus. She presented to the ER today with a complaint of severe substernal chest pressure, off and on for the last 3 days. It is described as pressure/tight pain with radiation down her arms and back. No relief with SL NTG. Her pain currently is 7/10. Telemetry showed ST depressions. POC troponin in the ER is elevated at 0.19. She was admitted and started on IV NTG, given MSO4 with improvement. Blood sugar was 572. She was given insulin. Integrelin started. Left heart cath revealed an RCA that was occluded proximally, 95% distal stenosis of the SVG to Diag(see full report below). She underwent successful PCI and stenting of a high-grade diagonal branch SVG lesion. She was treated with ASA and plavix. She had a hypoglycemic episode which was treated and improved to 92. Hgb A1C was 10.2. A long discussion was had with the patient regarding the need for follow up with Dr. Evlyn Kanner and the need for better diabetes mgt. The diabetes coordinator was consulted. Ranexa was started at 500mg  BID.  After discharge the family had discontinued a few of her medicines including Ranexa which apparently had caused her liver  enzyme abnormalities in the past. She was also markedly hypotensive and was seen in followup in Dr. Malena Catholic office with blood pressures of 70/40 and having associated fatigue and lethargy. I recommended holding some of her medications and if her blood pressure did not improve that she may need readmission. She follows up today and reports marked improvement in her symptoms and her blood pressure has indeed improved. She was inquiring today about whether or not she could come off of Plavix to have laser surgery to her eyes. She also reports that recently her sedimentation rate was elevated and Dr. Evlyn Kanner increased her prednisone to 10 mg daily.  PMHx:  Past Medical History  Diagnosis Date  . Respiratory arrest   . COPD (chronic obstructive pulmonary disease)   . CHF (congestive heart failure)   . Diabetes mellitus     insulin-dep  . Fibromyalgia   . Deaf     bilateral neurosensory hearing loss - Dr. Narda Bonds  . Myocardial infarction   . Stroke   . GERD (gastroesophageal reflux disease)   . Depression   . Hypertension   . Pneumonia   . Coronary artery disease   . Polymyalgia rheumatica   . Unstable angina   . PAD (peripheral artery disease)     LEAs 05/10/2012 - L SFA 50-69% diameter reduction, bilat ICA with 0-49% diameter reduction  . Tobacco abuse   . History of nuclear stress test 11/2011    lexiscan; low risk, non-diagnostic for ischemia    Past Surgical  History  Procedure Laterality Date  . Coronary artery bypass graft  04/03/1993    x5 LIMA to LAD; SVG to DX1; SVG to RCA, SVG to OM;  (Dr. Tyrone Sage)  . Eye surgery    . Coronary angioplasty  1995    RCA PTCA (07/23/1993); mid-LAD PTCA (08/21/1993); PTCA for restenosis RCA (09/13/1993),   . Coronary angioplasty  1998    Cfx & LAD PTCA  . Coronary angioplasty  1999    Cfx & LAD PTCA  . Coronary angioplasty  09/17/2004    cutting balloon small vessel mid LAD beyond LIMA insertion (2.5x69mm Cypher to mid SVG to RCA  . Coronary  angioplasty  05/21/2010    patent LIMA-LAD, diffuse disease distally, patent SVG-small diagonal, patent SVG-right w/widely patent stent, patent SVG-OM1 (Dr. Kirtland Bouchard. [place] Hilty)  . Transthoracic echocardiogram  12/2010    EF=>55%, mild conc LVH; IVC non-dilated w/>50% collapse; mild mitral annular calcif, calcified mitral apparatus, mild MD; mild TR & elevated RVSP; mild-mod AV regurg  . Coronary angioplasty  07/18/2008    patent SVG to acute marginal & PDA, SVG to OM1, SVG to diagonal 1, & LIMA to LAD (Dr. Evlyn Courier(  . Coronary angioplasty  04/20/2007    patent SVG-diagonal, SVG-obtuse marginal, SVG-RCA, LIMA-LAD, patent stent to SVG in RCA (Dr. Claudia Desanctis)  . Cardiac catheterization  04/10/2007    3x63mm Promus DES to SVG to RCA  . Coronary angioplasty  03/22/2006    patent LIMA to LAD, patent SVG to small diagonal, patent SVG to OM, patent vein graft to RV acute marginal & RCA (Dr. Laurell Josephs)  . Coronary angioplasty  12/03/2002    ballon angioplasty to lesion in prox LAD, prox Cfx & stenting, angioplasty to L AV groove artery (Dr. Jonette Eva)  . Coronary angioplasty  09/08/2000    Dr. Jonette Eva    FAMHx:  Family History  Problem Relation Age of Onset  . Hypertension Father     SOCHx:   reports that she quit smoking about 5 years ago. She does not have any smokeless tobacco history on file. She reports that she does not drink alcohol or use illicit drugs.  ALLERGIES:  Allergies  Allergen Reactions  . Biaxin [Clarithromycin]     rash  . Codeine   . Demerol [Meperidine]     Rash   . Flagyl [Metronidazole]   . Fluocinonide   . Ranexa [Ranolazine] Other (See Comments)    Elevated liver function  . Statins Other (See Comments)    Muscle aches    ROS: A comprehensive review of systems was negative except for: Constitutional: positive for fatigue Eyes: positive for vision loss, glaucoma Cardiovascular: positive for lower extremity edema Neurological: positive for dizziness and  weakness Behavioral/Psych: positive for anxiety  HOME MEDS: Current Outpatient Prescriptions  Medication Sig Dispense Refill  . ALPRAZolam (XANAX) 1 MG tablet Take 1 mg by mouth 4 (four) times daily as needed for sleep or anxiety.      Marland Kitchen amitriptyline (ELAVIL) 100 MG tablet Take 100 mg by mouth at bedtime.      Marland Kitchen amLODipine (NORVASC) 2.5 MG tablet Take 2.5 mg by mouth daily.      Marland Kitchen aspirin 81 MG chewable tablet Chew 81 mg by mouth 2 (two) times daily. Two a day per patient      . bimatoprost (LUMIGAN) 0.01 % SOLN Place 1 drop into both eyes at bedtime.      . Calcium Carbonate-Vitamin D (CALCIUM + D  PO) Take 1 tablet by mouth daily.      . carvedilol (COREG) 25 MG tablet Take 25 mg by mouth 2 (two) times daily with a meal.      . clopidogrel (PLAVIX) 75 MG tablet Take 1 tablet (75 mg total) by mouth daily.  30 tablet  11  . Cyanocobalamin (VITAMIN B 12 PO) Take 1 tablet by mouth daily.      . diclofenac sodium (VOLTAREN) 1 % GEL Apply 4 g topically daily.      . fentaNYL (DURAGESIC - DOSED MCG/HR) 25 MCG/HR Place 1 patch onto the skin every 3 (three) days.      . furosemide (LASIX) 40 MG tablet 40 mg. Take 40 mg in the morning and 20 mg in the evening      . insulin glargine (LANTUS) 100 UNIT/ML injection Inject 24 Units into the skin at bedtime.      . insulin lispro (HUMALOG) 100 UNIT/ML injection Inject 5-12 Units into the skin 3 (three) times daily before meals. 5-12 units per sliding scale      . iron polysaccharides (NU-IRON) 150 MG capsule Take 1 capsule (150 mg total) by mouth daily.      . isosorbide mononitrate (IMDUR) 60 MG 24 hr tablet Take 60 mg by mouth 2 (two) times daily.      . nitroGLYCERIN (NITROSTAT) 0.4 MG SL tablet Place 0.4 mg under the tongue every 5 (five) minutes as needed for chest pain.      Marland Kitchen omeprazole (PRILOSEC) 20 MG capsule Take 20 mg by mouth daily.      Marland Kitchen oxyCODONE-acetaminophen (PERCOCET) 10-325 MG per tablet Take 1 tablet by mouth every 4 (four) hours as  needed for pain.      . predniSONE (DELTASONE) 10 MG tablet Take 10 mg by mouth daily.      . sertraline (ZOLOFT) 50 MG tablet Take 50 mg by mouth daily.      . timolol (TIMOPTIC) 0.25 % ophthalmic solution Place 1 drop into both eyes 2 (two) times daily.      Marland Kitchen triamcinolone cream (KENALOG) 0.1 %       . Cholecalciferol (VITAMIN D PO) Take 1 tablet by mouth daily.       No current facility-administered medications for this visit.    LABS/IMAGING: No results found for this or any previous visit (from the past 48 hour(s)). No results found.  VITALS: BP 118/60  Ht 5\' 8"  (1.727 m)  Wt 153 lb 3.2 oz (69.491 kg)  BMI 23.30 kg/m2  EXAM: General appearance: alert, appears older than stated age, pale and no distress Neck: no carotid bruit, no JVD and thyroid not enlarged, symmetric, no tenderness/mass/nodules Lungs: clear to auscultation bilaterally Heart: regular rate and rhythm, S1, S2 normal, no murmur, click, rub or gallop Abdomen: soft, non-tender; bowel sounds normal; no masses,  no organomegaly Extremities: edema 1+ pitting bilaterally Pulses: 2+ and symmetric Skin: Pale, cool dry Neurologic: Mental status: Alert, oriented, thought content appropriate, hearing is signficantly impaired Psych: Anxious, tearful at times  EKG: Sinus rhythm with PVC's at 90  ASSESSMENT: 1. NSTEMI with recent PCI to SVG to RCA (3.0 x 15 Xience expedition DES) 2. CABG in 1995 with a LIMA to LAD, saphenous vein graft to OM1, saphenous vein graft to a diagonal, and saphenous vein graft to the right coronary artery. 3. Poorly controlled diabetes on insulin with recent blood sugar of 574 in the hospital 4. Diabetic retinopathy, sensorineural hearing loss, and peripheral  neuropathy 5. Dyslipidemia 6. PMR  PLAN: 1.   Mrs. Honea is feeling better now that her blood pressures recovered off of some of the medications that were removed for blood pressure. Specifically she was taken off of her ARB. She  remains on low-dose amlodipine 2.5 mg daily. In addition her Lasix was reduced to 20 mg at night. There is still complains about lower 70 swelling, however with her peripheral neuropathy there will be an element of swelling all the time. I recommended lower 70 compression stockings and elevation to help with this. She denies any further chest pain. Unfortunately she will need to continue dual antiplatelet therapy for at least one year he cannot have laser therapy or other procedures that may have been associated bleeding risk before that time. She is also part about a possible temporal artery biopsy. Notcher this is necessary since he is on steroids, and I suspect temporal arteritis as a cause of her vision loss is less likely than diabetic retinopathy. We'll plan to continue her current medications for now and see her back in 4 months.  Chrystie Nose, MD, [facility] Attending Cardiologist [facility]  HILTY,Kenneth C 01/21/2013, 10:56 AM

## 2013-01-28 ENCOUNTER — Telehealth: Payer: Self-pay | Admitting: Cardiovascular Disease

## 2013-01-28 NOTE — Telephone Encounter (Signed)
Returned call and pt verified x 2 w/ Johnny Bridge, pt's caregiver.  Stated pt is w/ the [facility], [facility] nurse, but wanted to know why the nurse in the hospital didn't follow Dr. Landry Dyke orders and put the catheter in.  Stated pt saw PCP last week and given Abxs for UTI.  Stated they did a culture, but results are not back yet.  Caregiver informed this RN cannot explain why the nurse in the hospital did not follow orders.  Also informed if culture done at PCP's office, then they should call to check on results as it will reveal if Abx rx'd will treat bacteria or not.  Also advised to ask for med for bladder pain/spasms since she stated pt w/ "severe pain" from infection.  Verbalized understanding and agreed w/ plan.

## 2013-01-28 NOTE — Telephone Encounter (Signed)
Please call-suffering with urinary problem.Please call asp Pt states all this comes from her last admission in the hospital.

## 2013-01-30 ENCOUNTER — Ambulatory Visit: Payer: Medicare Other | Admitting: Internal Medicine

## 2013-02-04 ENCOUNTER — Other Ambulatory Visit (HOSPITAL_COMMUNITY): Payer: Self-pay | Admitting: Pulmonary Disease

## 2013-02-04 DIAGNOSIS — N390 Urinary tract infection, site not specified: Secondary | ICD-10-CM

## 2013-02-05 ENCOUNTER — Ambulatory Visit (HOSPITAL_COMMUNITY)
Admission: RE | Admit: 2013-02-05 | Discharge: 2013-02-05 | Disposition: A | Discharge status: Home / Self Care | Payer: Medicare Other | Source: Ambulatory Visit | Attending: Pulmonary Disease | Admitting: Pulmonary Disease

## 2013-02-05 ENCOUNTER — Other Ambulatory Visit (HOSPITAL_COMMUNITY): Payer: Self-pay | Admitting: Pulmonary Disease

## 2013-02-05 ENCOUNTER — Encounter (HOSPITAL_COMMUNITY)
Admission: RE | Admit: 2013-02-05 | Discharge: 2013-02-05 | Disposition: A | Discharge status: Home / Self Care | Payer: Medicare Other | Source: Ambulatory Visit | Attending: Pulmonary Disease | Admitting: Pulmonary Disease

## 2013-02-05 DIAGNOSIS — N390 Urinary tract infection, site not specified: Secondary | ICD-10-CM

## 2013-02-05 DIAGNOSIS — M353 Polymyalgia rheumatica: Secondary | ICD-10-CM | POA: Insufficient documentation

## 2013-02-05 DIAGNOSIS — Z8614 Personal history of Methicillin resistant Staphylococcus aureus infection: Secondary | ICD-10-CM | POA: Insufficient documentation

## 2013-02-05 DIAGNOSIS — I2510 Atherosclerotic heart disease of native coronary artery without angina pectoris: Secondary | ICD-10-CM | POA: Insufficient documentation

## 2013-02-05 DIAGNOSIS — I10 Essential (primary) hypertension: Secondary | ICD-10-CM | POA: Insufficient documentation

## 2013-02-05 DIAGNOSIS — I214 Non-ST elevation (NSTEMI) myocardial infarction: Secondary | ICD-10-CM | POA: Insufficient documentation

## 2013-02-05 DIAGNOSIS — Z792 Long term (current) use of antibiotics: Secondary | ICD-10-CM | POA: Insufficient documentation

## 2013-02-05 DIAGNOSIS — Z452 Encounter for adjustment and management of vascular access device: Secondary | ICD-10-CM | POA: Insufficient documentation

## 2013-02-05 MED ORDER — HEPARIN SOD (PORK) LOCK FLUSH 100 UNIT/ML IV SOLN
250.0000 [IU] | INTRAVENOUS | Status: AC | PRN
  Administered 2013-02-05: 250 [IU]

## 2013-02-05 MED ORDER — HEPARIN SOD (PORK) LOCK FLUSH 100 UNIT/ML IV SOLN
INTRAVENOUS | Status: AC
  Administered 2013-02-05: 250 [IU]
  Filled 2013-02-05: qty 5

## 2013-02-05 MED ORDER — VANCOMYCIN HCL IN DEXTROSE 1-5 GM/200ML-% IV SOLN
1000.0000 mg | Freq: Once | INTRAVENOUS | Status: AC
  Administered 2013-02-05: 1000 mg via INTRAVENOUS
  Filled 2013-02-05: qty 200

## 2013-02-05 NOTE — Procedures (Signed)
Successful placement of single lumen power injectable PICC line to right basilic vein. Length 41cm Tip at lower SVC/RA No complications Ready for use.  Pattricia Boss PA-C Interventional Radiology  02/05/13 11:14 AM

## 2013-02-19 ENCOUNTER — Telehealth: Payer: Self-pay | Admitting: Internal Medicine

## 2013-02-19 NOTE — Telephone Encounter (Signed)
Would like to make you aware that the patient has have a couple of episodes of chest pains and shortness of breath  daily that has been relieved with Nitro Glycerin.. Wanted to make you aware of this If anymore questions please call ...   Thanks

## 2013-02-19 NOTE — Telephone Encounter (Signed)
LM for Roxanne with Genevieve Norlander to return call

## 2013-02-19 NOTE — Telephone Encounter (Signed)
LM with patient to return call.

## 2013-02-19 NOTE — Telephone Encounter (Signed)
Spoke with [name], patient's friend. She reports that patient has not had CP since Saturday (took SL NTG) and that the CP was precipitated by a "tremendous amount of stress" r/t staph UTI infection requiring BID IV antibiotics. Reports that got a call from PCP that infection had resolved, and also pain medication was increased from 1 tab PO q4h prn to 1.5 tabs PO q4h prn.[name] states that when had CP, it was about 30 mins prior to when xanax and pain medication were due. Informed [name] to have patient contact office with any more CP episodes.

## 2013-03-22 ENCOUNTER — Other Ambulatory Visit: Payer: Self-pay | Admitting: *Deleted

## 2013-03-22 MED ORDER — ISOSORBIDE MONONITRATE ER 60 MG PO TB24: 60.0000 mg | ORAL_TABLET | Freq: Two times a day (BID) | ORAL | Status: AC

## 2013-03-22 NOTE — Telephone Encounter (Signed)
Rx was sent to pharmacy electronically. 

## 2013-04-12 ENCOUNTER — Telehealth (HOSPITAL_COMMUNITY): Payer: Self-pay | Admitting: *Deleted

## 2013-04-16 ENCOUNTER — Telehealth (HOSPITAL_COMMUNITY): Payer: Self-pay | Admitting: *Deleted

## 2013-05-03 ENCOUNTER — Other Ambulatory Visit (HOSPITAL_COMMUNITY): Payer: Self-pay | Admitting: Internal Medicine

## 2013-05-03 DIAGNOSIS — I6529 Occlusion and stenosis of unspecified carotid artery: Secondary | ICD-10-CM

## 2013-05-08 ENCOUNTER — Telehealth (HOSPITAL_COMMUNITY): Payer: Self-pay | Admitting: *Deleted

## 2013-05-08 DIAGNOSIS — I70219 Atherosclerosis of native arteries of extremities with intermittent claudication, unspecified extremity: Secondary | ICD-10-CM

## 2013-05-08 NOTE — Telephone Encounter (Signed)
I called patient to reschedule her carotid test and the patients caretaker states that she is also supposed to have an LEA. Is the patient due? If so please place an order in epic so that I can schedule both tests together

## 2013-05-08 NOTE — Telephone Encounter (Signed)
Test ordered per notation of previous study 1 year ago

## 2013-05-09 ENCOUNTER — Encounter (HOSPITAL_COMMUNITY): Payer: Medicare Other

## 2013-05-16 ENCOUNTER — Telehealth: Payer: Self-pay | Admitting: Cardiovascular Disease

## 2013-05-16 NOTE — Telephone Encounter (Signed)
Returned call.  Left message to call back before 4pm.  

## 2013-05-16 NOTE — Telephone Encounter (Signed)
Please call-pt wants to let Dr Allyson SabalBerry that she did stop her Plavix.

## 2013-05-16 NOTE — Telephone Encounter (Signed)
Returned call.  Left message to call back before 4pm.  Not sure why pt stopped Plavix.  Last saw Dr. Allyson SabalBerry for cath in Oct 2014 when stent was placed.  Pt should NOT stop Plavix w/o MD order.

## 2013-05-22 ENCOUNTER — Ambulatory Visit (HOSPITAL_COMMUNITY)
Admission: RE | Admit: 2013-05-22 | Discharge: 2013-05-22 | Disposition: A | Discharge status: Home / Self Care | Payer: Medicare Other | Source: Ambulatory Visit | Attending: Internal Medicine | Admitting: Internal Medicine

## 2013-05-22 ENCOUNTER — Ambulatory Visit (HOSPITAL_BASED_OUTPATIENT_CLINIC_OR_DEPARTMENT_OTHER)
Admission: RE | Admit: 2013-05-22 | Discharge: 2013-05-22 | Disposition: A | Discharge status: Home / Self Care | Payer: Medicare Other | Source: Ambulatory Visit | Attending: Internal Medicine | Admitting: Internal Medicine

## 2013-05-22 DIAGNOSIS — I70219 Atherosclerosis of native arteries of extremities with intermittent claudication, unspecified extremity: Secondary | ICD-10-CM

## 2013-05-22 DIAGNOSIS — I6529 Occlusion and stenosis of unspecified carotid artery: Secondary | ICD-10-CM | POA: Insufficient documentation

## 2013-05-22 NOTE — Progress Notes (Signed)
Lower Extremity Arterial Duplex Completed. °Brianna L Mazza,RVT °

## 2013-05-22 NOTE — Progress Notes (Signed)
Carotid Duplex Completed. °Brianna L Mazza,RVT °

## 2013-06-03 ENCOUNTER — Telehealth: Payer: Self-pay | Admitting: *Deleted

## 2013-06-03 DIAGNOSIS — I6529 Occlusion and stenosis of unspecified carotid artery: Secondary | ICD-10-CM

## 2013-06-03 DIAGNOSIS — I739 Peripheral vascular disease, unspecified: Secondary | ICD-10-CM

## 2013-06-03 NOTE — Telephone Encounter (Signed)
Message copied by Lindell SparELKINS, Aura Bibby M on Mon Jun 03, 2013  1:24 PM ------      Message from: Chrystie NoseHILTY, KENNETH C      Created: Fri May 31, 2013  1:44 PM       Carotid dopplers look okay. Mild plaque. Repeat recommended in 6 months.            -Dr. Rennis GoldenHilty ------

## 2013-06-03 NOTE — Telephone Encounter (Signed)
Patient notified of results. Repeat dopplers ordered.

## 2013-06-18 ENCOUNTER — Encounter: Payer: Self-pay | Admitting: Internal Medicine

## 2013-06-18 ENCOUNTER — Ambulatory Visit (INDEPENDENT_AMBULATORY_CARE_PROVIDER_SITE_OTHER): Payer: Medicare Other | Admitting: Internal Medicine

## 2013-06-18 VITALS — BP 138/70 | HR 65 | Ht 64.5 in | Wt 160.4 lb

## 2013-06-18 DIAGNOSIS — E1139 Type 2 diabetes mellitus with other diabetic ophthalmic complication: Secondary | ICD-10-CM

## 2013-06-18 DIAGNOSIS — R079 Chest pain, unspecified: Secondary | ICD-10-CM

## 2013-06-18 DIAGNOSIS — E1065 Type 1 diabetes mellitus with hyperglycemia: Secondary | ICD-10-CM

## 2013-06-18 DIAGNOSIS — I10 Essential (primary) hypertension: Secondary | ICD-10-CM

## 2013-06-18 DIAGNOSIS — E1143 Type 2 diabetes mellitus with diabetic autonomic (poly)neuropathy: Secondary | ICD-10-CM

## 2013-06-18 DIAGNOSIS — IMO0002 Reserved for concepts with insufficient information to code with codable children: Secondary | ICD-10-CM

## 2013-06-18 DIAGNOSIS — E1149 Type 2 diabetes mellitus with other diabetic neurological complication: Secondary | ICD-10-CM

## 2013-06-18 DIAGNOSIS — I2510 Atherosclerotic heart disease of native coronary artery without angina pectoris: Secondary | ICD-10-CM

## 2013-06-18 DIAGNOSIS — E113593 Type 2 diabetes mellitus with proliferative diabetic retinopathy without macular edema, bilateral: Secondary | ICD-10-CM

## 2013-06-18 DIAGNOSIS — E11359 Type 2 diabetes mellitus with proliferative diabetic retinopathy without macular edema: Secondary | ICD-10-CM

## 2013-06-18 DIAGNOSIS — M353 Polymyalgia rheumatica: Secondary | ICD-10-CM

## 2013-06-18 DIAGNOSIS — G909 Disorder of the autonomic nervous system, unspecified: Secondary | ICD-10-CM

## 2013-06-18 NOTE — Patient Instructions (Signed)
Your physician wants you to follow-up in:  6 months. You will receive a reminder letter in the mail two months in advance. If you don't receive a letter, please call our office to schedule the follow-up appointment.   

## 2013-06-18 NOTE — Progress Notes (Signed)
OFFICE NOTE  Chief Complaint:  Hospital follow-up, not as fatigued, BP has improved  Primary Care Physician: Fredirick Maudlin, MD  HPI:  [name] is a 63 y/o female, previously followed by Dr. Alanda Amass, who presented with a complaint of chest pain. Her cardiac history is significant for CAD, s/p CABG in 1995 with a LIMA to LAD, saphenous vein graft to OM1, saphenous vein graft to a diagonal, and saphenous vein graft to the right coronary artery. Her last LHC was in 2012 for evaluation of chest pain. It was performed by Dr. Rennis Golden. She was found to have patent grafts. 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 She was started on BID Ranexa. Her history is also significant for polymyalgia rheumatica and diabetes mellitus. She presented to the ER today with a complaint of severe substernal chest pressure, off and on for the last 3 days. It is described as pressure/tight pain with radiation down her arms and back. No relief with SL NTG. Her pain currently is 7/10. Telemetry showed ST depressions. POC troponin in the ER is elevated at 0.19. She was admitted and started on IV NTG, given MSO4 with improvement. Blood sugar was 572. She was given insulin. Integrelin started. Left heart cath revealed an RCA that was occluded proximally, 95% distal stenosis of the SVG to Diag(see full report below). She underwent successful PCI and stenting of a high-grade diagonal branch SVG lesion. She was treated with ASA and plavix. She had a hypoglycemic episode which was treated and improved to 92. Hgb A1C was 10.2. A long discussion was had with the patient regarding the need for follow up with Dr. Evlyn Kanner and the need for better diabetes mgt. The diabetes coordinator was consulted. Ranexa was started at 500mg  BID.  After discharge the family had discontinued a few of her medicines including Ranexa which apparently had caused her liver  enzyme abnormalities in the past. She was also markedly hypotensive and was seen in followup in Dr. Malena Catholic office with blood pressures of 70/40 and having associated fatigue and lethargy. I recommended holding some of her medications and if her blood pressure did not improve that she may need readmission. She follows up today and reports marked improvement in her symptoms and her blood pressure has indeed improved.   [name] continues to have multiple medical problems. She reports recently that she had chest discomfort and emotional discomfort with the loss of her dog. She reported significant angina and was taking sublingual nitroglycerin and nitroglycerin several times a day with minimal improvement in her symptoms. In addition, she takes Imdur 60 mg twice daily. She was intolerant 2 Ranexa. She has known insufficient collaterals and unlikely targets for intervention. She is not a candidate for repeat cardiac surgery. She was placed on a Duragesic patch, which I think is helpful for her however she often forgets to change her patch and they may be a cause of her symptoms.  PMHx:  Past Medical History  Diagnosis Date  . Respiratory arrest   . COPD (chronic obstructive pulmonary disease)   . CHF (congestive heart failure)   . Diabetes mellitus     insulin-dep  . Fibromyalgia   . Deaf     bilateral neurosensory hearing loss - Dr. Narda Bonds  . Myocardial infarction   . Stroke   . GERD (gastroesophageal reflux disease)   . Depression   . Hypertension   .  Pneumonia   . Coronary artery disease   . Polymyalgia rheumatica   . Unstable angina   . PAD (peripheral artery disease)     LEAs 05/10/2012 - L SFA 50-69% diameter reduction, bilat ICA with 0-49% diameter reduction  . Tobacco abuse   . History of nuclear stress test 11/2011    lexiscan; low risk, non-diagnostic for ischemia    Past Surgical History  Procedure Laterality Date  . Coronary artery bypass graft  04/03/1993    x5 LIMA to  LAD; SVG to DX1; SVG to RCA, SVG to OM;  (Dr. Tyrone Sage)  . Eye surgery    . Coronary angioplasty  1995    RCA PTCA (07/23/1993); mid-LAD PTCA (08/21/1993); PTCA for restenosis RCA (09/13/1993),   . Coronary angioplasty  1998    Cfx & LAD PTCA  . Coronary angioplasty  1999    Cfx & LAD PTCA  . Coronary angioplasty  09/17/2004    cutting balloon small vessel mid LAD beyond LIMA insertion (2.5x54mm Cypher to mid SVG to RCA  . Coronary angioplasty  05/21/2010    patent LIMA-LAD, diffuse disease distally, patent SVG-small diagonal, patent SVG-right w/widely patent stent, patent SVG-OM1 (Dr. Kirtland Bouchard. [place] Rashon Westrup)  . Transthoracic echocardiogram  12/2010    EF=>55%, mild conc LVH; IVC non-dilated w/>50% collapse; mild mitral annular calcif, calcified mitral apparatus, mild MD; mild TR & elevated RVSP; mild-mod AV regurg  . Coronary angioplasty  07/18/2008    patent SVG to acute marginal & PDA, SVG to OM1, SVG to diagonal 1, & LIMA to LAD (Dr. Evlyn Courier(  . Coronary angioplasty  04/20/2007    patent SVG-diagonal, SVG-obtuse marginal, SVG-RCA, LIMA-LAD, patent stent to SVG in RCA (Dr. Claudia Desanctis)  . Cardiac catheterization  04/10/2007    3x40mm Promus DES to SVG to RCA  . Coronary angioplasty  03/22/2006    patent LIMA to LAD, patent SVG to small diagonal, patent SVG to OM, patent vein graft to RV acute marginal & RCA (Dr. Laurell Josephs)  . Coronary angioplasty  12/03/2002    ballon angioplasty to lesion in prox LAD, prox Cfx & stenting, angioplasty to L AV groove artery (Dr. Jonette Eva)  . Coronary angioplasty  09/08/2000    Dr. Jonette Eva    FAMHx:  Family History  Problem Relation Age of Onset  . Hypertension Father     SOCHx:   reports that she quit smoking about 5 years ago. She does not have any smokeless tobacco history on file. She reports that she does not drink alcohol or use illicit drugs.  ALLERGIES:  Allergies  Allergen Reactions  . Biaxin [Clarithromycin]     rash  . Codeine   . Demerol  [Meperidine]     Rash   . Flagyl [Metronidazole]   . Fluocinonide   . Ranexa [Ranolazine] Other (See Comments)    Elevated liver function  . Statins Other (See Comments)    Muscle aches    ROS: A comprehensive review of systems was negative except for: Constitutional: positive for fatigue Eyes: positive for vision loss, glaucoma Cardiovascular: positive for lower extremity edema Neurological: positive for dizziness and weakness Behavioral/Psych: positive for anxiety  HOME MEDS: Current Outpatient Prescriptions  Medication Sig Dispense Refill  . ALPRAZolam (XANAX) 1 MG tablet Take 1 mg by mouth 4 (four) times daily as needed for sleep or anxiety.      Marland Kitchen amitriptyline (ELAVIL) 100 MG tablet Take 100 mg by mouth at bedtime.      Marland Kitchen  amLODipine (NORVASC) 2.5 MG tablet Take 2.5 mg by mouth daily.      Marland Kitchen aspirin 81 MG chewable tablet Chew 81 mg by mouth 2 (two) times daily. Two a day per patient      . bimatoprost (LUMIGAN) 0.01 % SOLN Place 1 drop into both eyes at bedtime.      . Calcium Carbonate-Vitamin D (CALCIUM + D PO) Take 1 tablet by mouth daily.      . carvedilol (COREG) 25 MG tablet Take 25 mg by mouth 2 (two) times daily with a meal.      . Cholecalciferol (VITAMIN D PO) Take 1 tablet by mouth daily.      . clopidogrel (PLAVIX) 75 MG tablet Take 1 tablet (75 mg total) by mouth daily.  30 tablet  11  . COD LIVER OIL PO Take by mouth.      . Cyanocobalamin (VITAMIN B 12 PO) Take 1 tablet by mouth daily.      . diclofenac sodium (VOLTAREN) 1 % GEL Apply 4 g topically daily.      . fentaNYL (DURAGESIC - DOSED MCG/HR) 25 MCG/HR Place 1 patch onto the skin every 3 (three) days.      . folic acid (FOLVITE) 800 MCG tablet Take 1,600 mcg by mouth daily.      . furosemide (LASIX) 40 MG tablet 40 mg. Take 40 mg in the morning and 20 mg every other evening      . insulin glargine (LANTUS) 100 UNIT/ML injection Inject 20 Units into the skin at bedtime.       . insulin lispro (HUMALOG) 100  UNIT/ML injection Inject 5-12 Units into the skin 3 (three) times daily before meals. 5-12 units per sliding scale      . iron polysaccharides (NU-IRON) 150 MG capsule Take 1 capsule (150 mg total) by mouth daily.      . isosorbide mononitrate (IMDUR) 60 MG 24 hr tablet Take 1 tablet (60 mg total) by mouth 2 (two) times daily.  60 tablet  10  . multivitamin-lutein (OCUVITE-LUTEIN) CAPS capsule Take 1 capsule by mouth daily. 20 mg      . nitroGLYCERIN (NITROSTAT) 0.4 MG SL tablet Place 0.4 mg under the tongue every 5 (five) minutes as needed for chest pain.      Marland Kitchen omeprazole (PRILOSEC) 20 MG capsule Take 20 mg by mouth daily.      Marland Kitchen oxyCODONE-acetaminophen (PERCOCET) 10-325 MG per tablet Take 1 tablet by mouth every 4 (four) hours as needed for pain.      . predniSONE (DELTASONE) 10 MG tablet Take 10 mg by mouth daily.      . sertraline (ZOLOFT) 50 MG tablet Take 50 mg by mouth daily.      . timolol (TIMOPTIC) 0.25 % ophthalmic solution Place 1 drop into both eyes 2 (two) times daily.      . TRAVATAN Z 0.004 % SOLN ophthalmic solution Place 1 drop into both eyes at bedtime.      . triamcinolone cream (KENALOG) 0.1 %       . valsartan (DIOVAN) 40 MG tablet Take 40 mg by mouth daily.      . vitamin E 400 UNIT capsule Take 400 Units by mouth daily.      Marland Kitchen ZINC GLUCONATE PO Take by mouth.       No current facility-administered medications for this visit.    LABS/IMAGING: No results found for this or any previous visit (from the past 48 hour(s)). No  results found.  VITALS: BP 138/70  Pulse 65  Ht 5' 4.5" (1.638 m)  Wt 160 lb 6.4 oz (72.757 kg)  BMI 27.12 kg/m2  EXAM: General appearance: alert, appears older than stated age, pale and no distress Neck: no carotid bruit, no JVD and thyroid not enlarged, symmetric, no tenderness/mass/nodules Lungs: clear to auscultation bilaterally Heart: regular rate and rhythm, S1, S2 normal, no murmur, click, rub or gallop Abdomen: soft, non-tender; bowel  sounds normal; no masses,  no organomegaly Extremities: edema 1+ pitting bilaterally Pulses: 2+ and symmetric Skin: Pale, cool dry Neurologic: Mental status: Alert, oriented, thought content appropriate, hearing is signficantly impaired Psych: Anxious, tearful at times  EKG: Sinus rhythm at 65  ASSESSMENT: 1. NSTEMI with recent PCI to SVG to RCA (3.0 x 15 Xience expedition DES) 2. CABG in 1995 with a LIMA to LAD, saphenous vein graft to OM1, saphenous vein graft to a diagonal, and saphenous vein graft to the right coronary artery. 3. Poorly controlled diabetes on insulin with recent blood sugar of 574 in the hospital 4. Diabetic retinopathy, sensorineural hearing loss, and peripheral neuropathy 5. Dyslipidemia 6. PMR 7. Anxiety 8. 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  PLAN: 1.   Mrs. Danae OrleansBush has numerous medical problems and has recently been using high doses of nitrates. She did not contact us with this information and elected to present to the emergency room, as she feels that she was poorly treated and was asked to wait too long in the emergency room last time. She said that she would "rather die at home "then go to the emergency department. She says that her chest pain symptoms went on for several weeks and only this past weekend they improved. It is hard for me to believe that sublingual nitroglycerin is helpful for her on top of the 120 mg of Imdur were that she takes every day. It does seem that she is not been taking her Duragesic patch the way that she should be, and some of her pain may be due to this. 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 I have little else to offer her at this time.  Chrystie NoseKenneth C. Taronda Comacho, MD, [facility]ACC Attending Cardiologist [facility]  Lysle Yero C 06/18/2013, 7:43 PM

## 2013-06-27 ENCOUNTER — Ambulatory Visit (HOSPITAL_COMMUNITY): Payer: Medicare Other | Admitting: Psychiatry

## 2013-08-09 ENCOUNTER — Other Ambulatory Visit: Payer: Self-pay | Admitting: *Deleted

## 2013-08-09 MED ORDER — FUROSEMIDE 40 MG PO TABS: ORAL_TABLET | ORAL | Status: DC

## 2013-08-14 ENCOUNTER — Emergency Department (HOSPITAL_COMMUNITY)
Admission: EM | Admit: 2013-08-14 | Discharge: 2013-08-14 | Disposition: A | Discharge status: Home / Self Care | Payer: Medicare Other | Source: Home / Self Care | Attending: Family Medicine | Admitting: Family Medicine

## 2013-08-14 ENCOUNTER — Emergency Department (INDEPENDENT_AMBULATORY_CARE_PROVIDER_SITE_OTHER): Payer: Medicare Other

## 2013-08-14 ENCOUNTER — Emergency Department (HOSPITAL_COMMUNITY): Payer: Medicare Other

## 2013-08-14 ENCOUNTER — Encounter (HOSPITAL_COMMUNITY): Payer: Self-pay | Admitting: Emergency Medicine

## 2013-08-14 DIAGNOSIS — S60229A Contusion of unspecified hand, initial encounter: Secondary | ICD-10-CM

## 2013-08-14 DIAGNOSIS — IMO0002 Reserved for concepts with insufficient information to code with codable children: Secondary | ICD-10-CM

## 2013-08-14 NOTE — ED Notes (Signed)
Pt c/o right hand/finger inj onset Monday night Reports she fell onto toilet Pain and swelling; alert w/no signs of acute distress.

## 2013-08-14 NOTE — ED Provider Notes (Signed)
[name] is a 63 y.o. female who presents to Urgent Care today for right finger injury. 3 days ago patient jammed her fourth and fifth digits on a bedside commode. She notes pain and swelling. She notes the pain is worse activity and better with rest. She has oxycodone helps control her pain. She feels well otherwise.   Past Medical History  Diagnosis Date  . Respiratory arrest   . COPD (chronic obstructive pulmonary disease)   . CHF (congestive heart failure)   . Diabetes mellitus     insulin-dep  . Fibromyalgia   . Deaf     bilateral neurosensory hearing loss - Dr. Narda Bonds  . Myocardial infarction   . Stroke   . GERD (gastroesophageal reflux disease)   . Depression   . Hypertension   . Pneumonia   . Coronary artery disease   . Polymyalgia rheumatica   . Unstable angina   . PAD (peripheral artery disease)     LEAs 05/10/2012 - L SFA 50-69% diameter reduction, bilat ICA with 0-49% diameter reduction  . Tobacco abuse   . History of nuclear stress test 11/2011    lexiscan; low risk, non-diagnostic for ischemia   History  Substance Use Topics  . Smoking status: Former Smoker    Quit date: 01/19/2008  . Smokeless tobacco: Not on file  . Alcohol Use: No   ROS as above Medications: No current facility-administered medications for this encounter.   Current Outpatient Prescriptions  Medication Sig Dispense Refill  . ALPRAZolam (XANAX) 1 MG tablet Take 1 mg by mouth 4 (four) times daily as needed for sleep or anxiety.      Marland Kitchen amitriptyline (ELAVIL) 100 MG tablet Take 100 mg by mouth at bedtime.      Marland Kitchen amLODipine (NORVASC) 2.5 MG tablet Take 2.5 mg by mouth daily.      Marland Kitchen aspirin 81 MG chewable tablet Chew 81 mg by mouth 2 (two) times daily. Two a day per patient      . bimatoprost (LUMIGAN) 0.01 % SOLN Place 1 drop into both eyes at bedtime.      . Calcium Carbonate-Vitamin D (CALCIUM + D PO) Take 1 tablet by mouth daily.      . carvedilol (COREG) 25 MG tablet Take 25 mg by  mouth 2 (two) times daily with a meal.      . Cholecalciferol (VITAMIN D PO) Take 1 tablet by mouth daily.      . clopidogrel (PLAVIX) 75 MG tablet Take 1 tablet (75 mg total) by mouth daily.  30 tablet  11  . COD LIVER OIL PO Take by mouth.      . Cyanocobalamin (VITAMIN B 12 PO) Take 1 tablet by mouth daily.      . diclofenac sodium (VOLTAREN) 1 % GEL Apply 4 g topically daily.      . folic acid (FOLVITE) 800 MCG tablet Take 1,600 mcg by mouth daily.      . furosemide (LASIX) 40 MG tablet Take 40 mg in the morning and 20 mg every other evening  45 tablet  10  . insulin glargine (LANTUS) 100 UNIT/ML injection Inject 20 Units into the skin at bedtime.       . insulin lispro (HUMALOG) 100 UNIT/ML injection Inject 5-12 Units into the skin 3 (three) times daily before meals. 5-12 units per sliding scale      . iron polysaccharides (NU-IRON) 150 MG capsule Take 1 capsule (150 mg total) by mouth daily.      Marland Kitchen  isosorbide mononitrate (IMDUR) 60 MG 24 hr tablet Take 1 tablet (60 mg total) by mouth 2 (two) times daily.  60 tablet  10  . omeprazole (PRILOSEC) 20 MG capsule Take 20 mg by mouth daily.      Marland Kitchen. oxyCODONE-acetaminophen (PERCOCET) 10-325 MG per tablet Take 1 tablet by mouth every 4 (four) hours as needed for pain.      . predniSONE (DELTASONE) 10 MG tablet Take 10 mg by mouth daily.      . sertraline (ZOLOFT) 50 MG tablet Take 50 mg by mouth daily.      . valsartan (DIOVAN) 40 MG tablet Take 40 mg by mouth daily.      . fentaNYL (DURAGESIC - DOSED MCG/HR) 25 MCG/HR Place 1 patch onto the skin every 3 (three) days.      . multivitamin-lutein (OCUVITE-LUTEIN) CAPS capsule Take 1 capsule by mouth daily. 20 mg      . nitroGLYCERIN (NITROSTAT) 0.4 MG SL tablet Place 0.4 mg under the tongue every 5 (five) minutes as needed for chest pain.      Marland Kitchen. timolol (TIMOPTIC) 0.25 % ophthalmic solution Place 1 drop into both eyes 2 (two) times daily.      . TRAVATAN Z 0.004 % SOLN ophthalmic solution Place 1 drop  into both eyes at bedtime.      . triamcinolone cream (KENALOG) 0.1 %       . vitamin E 400 UNIT capsule Take 400 Units by mouth daily.      Marland Kitchen. ZINC GLUCONATE PO Take by mouth.        Exam:  BP 144/48  Pulse 70  Temp(Src) 97.8 F (36.6 C) (Oral)  Resp 18  SpO2 100% Gen: Well NAD Right hand: No deformity present. Ecchymosis and swelling at the MCP and PIP of the fourth and fifth digits. Motion is intact as is strength. Capillary refill and pulses are intact.  No results found for this or any previous visit (from the past 24 hour(s)). Dg Hand Complete Right  08/14/2013   CLINICAL DATA:  Status post fall 3 days ago. Pain about the right third through fifth metacarpals. Bruising.  EXAM: RIGHT HAND - COMPLETE 3+ VIEW  COMPARISON:  None.  FINDINGS: No acute bony or joint abnormality is identified. Scattered, mild degenerative disease is seen about the DIP joints and STT CMC joint. Soft tissue structures are unremarkable.  IMPRESSION: No acute finding.   Electronically Signed   By: Drusilla Kannerhomas  Dalessio M.D.   On: 08/14/2013 15:24    Assessment and Plan: 63 y.o. female with hand contusion. Plan for buddy tape and chronic pain medications. I believe the injury has more ecchymosis than would otherwise normal be present with a contusion because patient takes Plavix.  Discussed warning signs or symptoms. Please see discharge instructions. Patient expresses understanding.    Rodolph BongEvan S Corey, MD 08/14/13 [phone]

## 2013-08-14 NOTE — Discharge Instructions (Signed)
Thank you for coming in today. Use your pain medicine as needed.  Buddy tape for 1-2 weeks.  Follow up with hand surgery if not better.  Hand Contusion A hand contusion is a deep bruise on your hand area. Contusions are the result of an injury that caused bleeding under the skin. The contusion may turn blue, purple, or yellow. Minor injuries will give you a painless contusion, but more severe contusions may stay painful and swollen for a few weeks. CAUSES  A contusion is usually caused by a blow, trauma, or direct force to an area of the body. SYMPTOMS   Swelling and redness of the injured area.  Discoloration of the injured area.  Tenderness and soreness of the injured area.  Pain. DIAGNOSIS  The diagnosis can be made by taking a history and performing a physical exam. An X-ray, CT scan, or MRI may be needed to determine if there were any associated injuries, such as broken bones (fractures). TREATMENT  Often, the best treatment for a hand contusion is resting, elevating, icing, and applying cold compresses to the injured area. Over-the-counter medicines may also be recommended for pain control. HOME CARE INSTRUCTIONS   Put ice on the injured area.  Put ice in a plastic bag.  Place a towel between your skin and the bag.  Leave the ice on for 15-20 minutes, 03-04 times a day.  Only take over-the-counter or prescription medicines as directed by your caregiver. Your caregiver may recommend avoiding anti-inflammatory medicines (aspirin, ibuprofen, and naproxen) for 48 hours because these medicines may increase bruising.  If told, use an elastic wrap as directed. This can help reduce swelling. You may remove the wrap for sleeping, showering, and bathing. If your fingers become numb, cold, or blue, take the wrap off and reapply it more loosely.  Elevate your hand with pillows to reduce swelling.  Avoid overusing your hand if it is painful. SEEK IMMEDIATE MEDICAL CARE IF:   You  have increased redness, swelling, or pain in your hand.  Your swelling or pain is not relieved with medicines.  You have loss of feeling in your hand or are unable to move your fingers.  Your hand turns cold or blue.  You have pain when you move your fingers.  Your hand becomes warm to the touch.  Your contusion does not improve in 2 days. MAKE SURE YOU:   Understand these instructions.  Will watch your condition.  Will get help right away if you are not doing well or get worse. Document Released: 08/20/2001 Document Revised: 11/23/2011 Document Reviewed: 08/22/2011 [facility] Patient Information 2014 [place], [place].

## 2013-09-03 ENCOUNTER — Ambulatory Visit: Payer: Medicare Other | Admitting: Gastroenterology

## 2013-09-04 ENCOUNTER — Encounter: Payer: Self-pay | Admitting: Gastroenterology

## 2013-09-18 ENCOUNTER — Emergency Department (HOSPITAL_COMMUNITY): Payer: Medicare Other

## 2013-09-18 ENCOUNTER — Encounter (HOSPITAL_COMMUNITY): Payer: Self-pay | Admitting: Emergency Medicine

## 2013-09-18 ENCOUNTER — Inpatient Hospital Stay (HOSPITAL_COMMUNITY)
Admission: EM | Admit: 2013-09-18 | Discharge: 2013-10-01 | DRG: 480 | Disposition: A | Discharge status: Skilled Nursing Facility | Payer: Medicare Other | Attending: Internal Medicine | Admitting: Internal Medicine

## 2013-09-18 DIAGNOSIS — M810 Age-related osteoporosis without current pathological fracture: Secondary | ICD-10-CM | POA: Diagnosis present

## 2013-09-18 DIAGNOSIS — G609 Hereditary and idiopathic neuropathy, unspecified: Secondary | ICD-10-CM | POA: Diagnosis present

## 2013-09-18 DIAGNOSIS — R32 Unspecified urinary incontinence: Secondary | ICD-10-CM | POA: Diagnosis present

## 2013-09-18 DIAGNOSIS — J9600 Acute respiratory failure, unspecified whether with hypoxia or hypercapnia: Secondary | ICD-10-CM | POA: Diagnosis not present

## 2013-09-18 DIAGNOSIS — G8929 Other chronic pain: Secondary | ICD-10-CM | POA: Diagnosis present

## 2013-09-18 DIAGNOSIS — Z91199 Patient's noncompliance with other medical treatment and regimen due to unspecified reason: Secondary | ICD-10-CM | POA: Diagnosis not present

## 2013-09-18 DIAGNOSIS — IMO0002 Reserved for concepts with insufficient information to code with codable children: Secondary | ICD-10-CM | POA: Diagnosis present

## 2013-09-18 DIAGNOSIS — F329 Major depressive disorder, single episode, unspecified: Secondary | ICD-10-CM | POA: Diagnosis present

## 2013-09-18 DIAGNOSIS — R410 Disorientation, unspecified: Secondary | ICD-10-CM | POA: Diagnosis not present

## 2013-09-18 DIAGNOSIS — J9601 Acute respiratory failure with hypoxia: Secondary | ICD-10-CM | POA: Diagnosis present

## 2013-09-18 DIAGNOSIS — Z87891 Personal history of nicotine dependence: Secondary | ICD-10-CM | POA: Diagnosis not present

## 2013-09-18 DIAGNOSIS — E1065 Type 1 diabetes mellitus with hyperglycemia: Secondary | ICD-10-CM

## 2013-09-18 DIAGNOSIS — M353 Polymyalgia rheumatica: Secondary | ICD-10-CM | POA: Diagnosis present

## 2013-09-18 DIAGNOSIS — I2510 Atherosclerotic heart disease of native coronary artery without angina pectoris: Secondary | ICD-10-CM | POA: Diagnosis present

## 2013-09-18 DIAGNOSIS — I2589 Other forms of chronic ischemic heart disease: Secondary | ICD-10-CM | POA: Diagnosis present

## 2013-09-18 DIAGNOSIS — Z79899 Other long term (current) drug therapy: Secondary | ICD-10-CM

## 2013-09-18 DIAGNOSIS — I5043 Acute on chronic combined systolic (congestive) and diastolic (congestive) heart failure: Secondary | ICD-10-CM | POA: Diagnosis not present

## 2013-09-18 DIAGNOSIS — W19XXXA Unspecified fall, initial encounter: Secondary | ICD-10-CM | POA: Diagnosis present

## 2013-09-18 DIAGNOSIS — M25559 Pain in unspecified hip: Secondary | ICD-10-CM | POA: Diagnosis present

## 2013-09-18 DIAGNOSIS — F3289 Other specified depressive episodes: Secondary | ICD-10-CM | POA: Diagnosis present

## 2013-09-18 DIAGNOSIS — E1059 Type 1 diabetes mellitus with other circulatory complications: Secondary | ICD-10-CM

## 2013-09-18 DIAGNOSIS — F0390 Unspecified dementia without behavioral disturbance: Secondary | ICD-10-CM | POA: Diagnosis present

## 2013-09-18 DIAGNOSIS — I739 Peripheral vascular disease, unspecified: Secondary | ICD-10-CM | POA: Diagnosis present

## 2013-09-18 DIAGNOSIS — J449 Chronic obstructive pulmonary disease, unspecified: Secondary | ICD-10-CM | POA: Diagnosis present

## 2013-09-18 DIAGNOSIS — Z794 Long term (current) use of insulin: Secondary | ICD-10-CM | POA: Diagnosis not present

## 2013-09-18 DIAGNOSIS — R9431 Abnormal electrocardiogram [ECG] [EKG]: Secondary | ICD-10-CM | POA: Diagnosis present

## 2013-09-18 DIAGNOSIS — E2749 Other adrenocortical insufficiency: Secondary | ICD-10-CM | POA: Diagnosis present

## 2013-09-18 DIAGNOSIS — K219 Gastro-esophageal reflux disease without esophagitis: Secondary | ICD-10-CM | POA: Diagnosis present

## 2013-09-18 DIAGNOSIS — H903 Sensorineural hearing loss, bilateral: Secondary | ICD-10-CM

## 2013-09-18 DIAGNOSIS — N390 Urinary tract infection, site not specified: Secondary | ICD-10-CM | POA: Diagnosis present

## 2013-09-18 DIAGNOSIS — F411 Generalized anxiety disorder: Secondary | ICD-10-CM | POA: Diagnosis present

## 2013-09-18 DIAGNOSIS — Z7982 Long term (current) use of aspirin: Secondary | ICD-10-CM

## 2013-09-18 DIAGNOSIS — IMO0001 Reserved for inherently not codable concepts without codable children: Secondary | ICD-10-CM | POA: Diagnosis present

## 2013-09-18 DIAGNOSIS — I509 Heart failure, unspecified: Secondary | ICD-10-CM | POA: Diagnosis present

## 2013-09-18 DIAGNOSIS — E1069 Type 1 diabetes mellitus with other specified complication: Secondary | ICD-10-CM

## 2013-09-18 DIAGNOSIS — E113593 Type 2 diabetes mellitus with proliferative diabetic retinopathy without macular edema, bilateral: Secondary | ICD-10-CM

## 2013-09-18 DIAGNOSIS — R7989 Other specified abnormal findings of blood chemistry: Secondary | ICD-10-CM | POA: Diagnosis not present

## 2013-09-18 DIAGNOSIS — F05 Delirium due to known physiological condition: Secondary | ICD-10-CM

## 2013-09-18 DIAGNOSIS — Z765 Malingerer [conscious simulation]: Secondary | ICD-10-CM | POA: Diagnosis present

## 2013-09-18 DIAGNOSIS — S72009A Fracture of unspecified part of neck of unspecified femur, initial encounter for closed fracture: Secondary | ICD-10-CM

## 2013-09-18 DIAGNOSIS — S72033A Displaced midcervical fracture of unspecified femur, initial encounter for closed fracture: Principal | ICD-10-CM | POA: Diagnosis present

## 2013-09-18 DIAGNOSIS — Z9119 Patient's noncompliance with other medical treatment and regimen: Secondary | ICD-10-CM

## 2013-09-18 DIAGNOSIS — I25119 Atherosclerotic heart disease of native coronary artery with unspecified angina pectoris: Secondary | ICD-10-CM

## 2013-09-18 DIAGNOSIS — I214 Non-ST elevation (NSTEMI) myocardial infarction: Secondary | ICD-10-CM | POA: Diagnosis present

## 2013-09-18 DIAGNOSIS — R404 Transient alteration of awareness: Secondary | ICD-10-CM | POA: Diagnosis not present

## 2013-09-18 DIAGNOSIS — Z8673 Personal history of transient ischemic attack (TIA), and cerebral infarction without residual deficits: Secondary | ICD-10-CM

## 2013-09-18 DIAGNOSIS — Z951 Presence of aortocoronary bypass graft: Secondary | ICD-10-CM

## 2013-09-18 DIAGNOSIS — I5022 Chronic systolic (congestive) heart failure: Secondary | ICD-10-CM

## 2013-09-18 DIAGNOSIS — I5033 Acute on chronic diastolic (congestive) heart failure: Secondary | ICD-10-CM

## 2013-09-18 DIAGNOSIS — F419 Anxiety disorder, unspecified: Secondary | ICD-10-CM | POA: Diagnosis present

## 2013-09-18 DIAGNOSIS — Z9861 Coronary angioplasty status: Secondary | ICD-10-CM | POA: Diagnosis not present

## 2013-09-18 DIAGNOSIS — Z781 Physical restraint status: Secondary | ICD-10-CM | POA: Diagnosis present

## 2013-09-18 DIAGNOSIS — I252 Old myocardial infarction: Secondary | ICD-10-CM

## 2013-09-18 DIAGNOSIS — R778 Other specified abnormalities of plasma proteins: Secondary | ICD-10-CM | POA: Diagnosis not present

## 2013-09-18 DIAGNOSIS — R58 Hemorrhage, not elsewhere classified: Secondary | ICD-10-CM

## 2013-09-18 DIAGNOSIS — I255 Ischemic cardiomyopathy: Secondary | ICD-10-CM | POA: Diagnosis present

## 2013-09-18 DIAGNOSIS — I10 Essential (primary) hypertension: Secondary | ICD-10-CM | POA: Diagnosis present

## 2013-09-18 DIAGNOSIS — S72001A Fracture of unspecified part of neck of right femur, initial encounter for closed fracture: Secondary | ICD-10-CM

## 2013-09-18 DIAGNOSIS — D72829 Elevated white blood cell count, unspecified: Secondary | ICD-10-CM

## 2013-09-18 DIAGNOSIS — D62 Acute posthemorrhagic anemia: Secondary | ICD-10-CM | POA: Diagnosis not present

## 2013-09-18 DIAGNOSIS — I25709 Atherosclerosis of coronary artery bypass graft(s), unspecified, with unspecified angina pectoris: Secondary | ICD-10-CM

## 2013-09-18 DIAGNOSIS — R079 Chest pain, unspecified: Secondary | ICD-10-CM

## 2013-09-18 DIAGNOSIS — J4489 Other specified chronic obstructive pulmonary disease: Secondary | ICD-10-CM | POA: Diagnosis present

## 2013-09-18 DIAGNOSIS — I2584 Coronary atherosclerosis due to calcified coronary lesion: Secondary | ICD-10-CM

## 2013-09-18 LAB — URINALYSIS, ROUTINE W REFLEX MICROSCOPIC
BILIRUBIN URINE: NEGATIVE
Glucose, UA: >1000 mg/dL — AB
Ketones, ur: NEGATIVE mg/dL
Nitrite: NEGATIVE
PROTEIN: NEGATIVE mg/dL
Specific Gravity, Urine: 1.016 (ref 1.005–1.030)
Urobilinogen, UA: 0.2 mg/dL (ref 0.0–1.0)
pH: 6.5 (ref 5.0–8.0)

## 2013-09-18 LAB — COMPREHENSIVE METABOLIC PANEL
ALBUMIN: 3.5 g/dL (ref 3.5–5.2)
ALT: 18 U/L (ref 0–35)
ANION GAP: 14 (ref 5–15)
AST: 17 U/L (ref 0–37)
Alkaline Phosphatase: 96 U/L (ref 39–117)
BUN: 20 mg/dL (ref 6–23)
CHLORIDE: 91 meq/L — AB (ref 96–112)
CO2: 29 mEq/L (ref 19–32)
Calcium: 9.1 mg/dL (ref 8.4–10.5)
Creatinine, Ser: 0.94 mg/dL (ref 0.50–1.10)
GFR calc Af Amer: 74 mL/min — ABNORMAL LOW (ref >90)
GFR calc non Af Amer: 64 mL/min — ABNORMAL LOW (ref >90)
Glucose, Bld: 431 mg/dL — ABNORMAL HIGH (ref 70–99)
Potassium: 4.4 mEq/L (ref 3.7–5.3)
Sodium: 134 mEq/L — ABNORMAL LOW (ref 137–147)
TOTAL PROTEIN: 7.6 g/dL (ref 6.0–8.3)

## 2013-09-18 LAB — CBC WITH DIFFERENTIAL/PLATELET
BASOS PCT: 0 % (ref 0–1)
Basophils Absolute: 0 10*3/uL (ref 0.0–0.1)
EOS ABS: 0.2 10*3/uL (ref 0.0–0.7)
Eosinophils Relative: 2 % (ref 0–5)
HEMATOCRIT: 33.7 % — AB (ref 36.0–46.0)
HEMOGLOBIN: 11.3 g/dL — AB (ref 12.0–15.0)
Lymphocytes Relative: 21 % (ref 12–46)
Lymphs Abs: 2.1 10*3/uL (ref 0.7–4.0)
MCH: 30.1 pg (ref 26.0–34.0)
MCHC: 33.5 g/dL (ref 30.0–36.0)
MCV: 89.9 fL (ref 78.0–100.0)
MONO ABS: 0.7 10*3/uL (ref 0.1–1.0)
Monocytes Relative: 7 % (ref 3–12)
Neutro Abs: 6.9 10*3/uL (ref 1.7–7.7)
Neutrophils Relative %: 70 % (ref 43–77)
Platelets: 364 10*3/uL (ref 150–400)
RBC: 3.75 MIL/uL — ABNORMAL LOW (ref 3.87–5.11)
RDW: 12.6 % (ref 11.5–15.5)
WBC: 10 10*3/uL (ref 4.0–10.5)

## 2013-09-18 LAB — CBC
HCT: 31.9 % — ABNORMAL LOW (ref 36.0–46.0)
Hemoglobin: 10.7 g/dL — ABNORMAL LOW (ref 12.0–15.0)
MCH: 29.8 pg (ref 26.0–34.0)
MCHC: 33.5 g/dL (ref 30.0–36.0)
MCV: 88.9 fL (ref 78.0–100.0)
Platelets: 370 10*3/uL (ref 150–400)
RBC: 3.59 MIL/uL — ABNORMAL LOW (ref 3.87–5.11)
RDW: 12.4 % (ref 11.5–15.5)
WBC: 10.1 10*3/uL (ref 4.0–10.5)

## 2013-09-18 LAB — PROTIME-INR
INR: 0.94 (ref 0.00–1.49)
PROTHROMBIN TIME: 12.6 s (ref 11.6–15.2)

## 2013-09-18 LAB — URINE MICROSCOPIC-ADD ON

## 2013-09-18 LAB — GLUCOSE, CAPILLARY: GLUCOSE-CAPILLARY: 352 mg/dL — AB (ref 70–99)

## 2013-09-18 LAB — CREATININE, SERUM
CREATININE: 0.86 mg/dL (ref 0.50–1.10)
GFR, EST AFRICAN AMERICAN: 83 mL/min — AB (ref >90)
GFR, EST NON AFRICAN AMERICAN: 71 mL/min — AB (ref >90)

## 2013-09-18 LAB — PRO B NATRIURETIC PEPTIDE: Pro B Natriuretic peptide (BNP): 700.3 pg/mL — ABNORMAL HIGH (ref 0–125)

## 2013-09-18 MED ORDER — SODIUM CHLORIDE 0.9 % IJ SOLN
3.0000 mL | Freq: Two times a day (BID) | INTRAMUSCULAR | Status: DC
  Administered 2013-09-19 – 2013-09-30 (×17): 3 mL via INTRAVENOUS

## 2013-09-18 MED ORDER — CLOPIDOGREL BISULFATE 75 MG PO TABS: 75.0000 mg | ORAL_TABLET | Freq: Every day | ORAL | Status: DC

## 2013-09-18 MED ORDER — PREDNISONE 10 MG PO TABS: 10.0000 mg | ORAL_TABLET | Freq: Every day | ORAL | Status: DC

## 2013-09-18 MED ORDER — AMLODIPINE BESYLATE 2.5 MG PO TABS
2.5000 mg | ORAL_TABLET | Freq: Every day | ORAL | Status: DC
  Administered 2013-09-19 – 2013-10-01 (×12): 2.5 mg via ORAL
  Filled 2013-09-18 (×13): qty 1

## 2013-09-18 MED ORDER — AMITRIPTYLINE HCL 100 MG PO TABS
100.0000 mg | ORAL_TABLET | Freq: Every day | ORAL | Status: DC
  Administered 2013-09-19 – 2013-09-27 (×9): 100 mg via ORAL
  Filled 2013-09-18 (×10): qty 1

## 2013-09-18 MED ORDER — NITROGLYCERIN 0.4 MG SL SUBL
0.4000 mg | SUBLINGUAL_TABLET | SUBLINGUAL | Status: DC | PRN
  Administered 2013-09-21 – 2013-09-25 (×7): 0.4 mg via SUBLINGUAL
  Filled 2013-09-18 (×6): qty 1

## 2013-09-18 MED ORDER — TIMOLOL MALEATE 0.25 % OP SOLN
1.0000 [drp] | Freq: Every day | OPHTHALMIC | Status: DC
  Administered 2013-09-19 – 2013-10-01 (×13): 1 [drp] via OPHTHALMIC
  Filled 2013-09-18: qty 5

## 2013-09-18 MED ORDER — PANTOPRAZOLE SODIUM 40 MG PO TBEC: 40.0000 mg | DELAYED_RELEASE_TABLET | Freq: Every day | ORAL | Status: DC

## 2013-09-18 MED ORDER — CALCIUM CARBONATE-VITAMIN D 500-200 MG-UNIT PO TABS
1.0000 | ORAL_TABLET | Freq: Every day | ORAL | Status: DC
  Administered 2013-09-20 – 2013-10-01 (×12): 1 via ORAL
  Filled 2013-09-18 (×13): qty 1

## 2013-09-18 MED ORDER — HYDROMORPHONE HCL PF 1 MG/ML IJ SOLN
0.5000 mg | INTRAMUSCULAR | Status: DC | PRN
  Administered 2013-09-18 – 2013-09-20 (×8): 1 mg via INTRAVENOUS
  Filled 2013-09-18 (×8): qty 1

## 2013-09-18 MED ORDER — LUTEIN 20 MG PO CAPS: 1.0000 | ORAL_CAPSULE | Freq: Every day | ORAL | Status: DC

## 2013-09-18 MED ORDER — FUROSEMIDE 20 MG PO TABS: 40.0000 mg | ORAL_TABLET | ORAL | Status: DC

## 2013-09-18 MED ORDER — FUROSEMIDE 40 MG PO TABS
40.0000 mg | ORAL_TABLET | Freq: Every day | ORAL | Status: DC
  Administered 2013-09-18 – 2013-09-20 (×3): 40 mg via ORAL
  Filled 2013-09-18 (×4): qty 1

## 2013-09-18 MED ORDER — FOLIC ACID 800 MCG PO TABS: 1600.0000 ug | ORAL_TABLET | Freq: Every day | ORAL | Status: DC

## 2013-09-18 MED ORDER — FUROSEMIDE 20 MG PO TABS
20.0000 mg | ORAL_TABLET | ORAL | Status: DC
  Filled 2013-09-18 (×2): qty 1

## 2013-09-18 MED ORDER — SERTRALINE HCL 50 MG PO TABS
50.0000 mg | ORAL_TABLET | Freq: Every day | ORAL | Status: DC
  Administered 2013-09-20 – 2013-10-01 (×12): 50 mg via ORAL
  Filled 2013-09-18 (×13): qty 1

## 2013-09-18 MED ORDER — POTASSIUM CHLORIDE CRYS ER 10 MEQ PO TBCR: 30.0000 meq | EXTENDED_RELEASE_TABLET | Freq: Every day | ORAL | Status: DC

## 2013-09-18 MED ORDER — INSULIN GLARGINE 100 UNIT/ML ~~LOC~~ SOLN
25.0000 [IU] | Freq: Two times a day (BID) | SUBCUTANEOUS | Status: DC
  Administered 2013-09-18: 25 [IU] via SUBCUTANEOUS
  Filled 2013-09-18 (×2): qty 0.25

## 2013-09-18 MED ORDER — CALCIUM CARB-CHOLECALCIFEROL 600-500 MG-UNIT PO CAPS: 1.0000 | ORAL_CAPSULE | Freq: Every day | ORAL | Status: DC

## 2013-09-18 MED ORDER — ASPIRIN EC 81 MG PO TBEC
162.0000 mg | DELAYED_RELEASE_TABLET | Freq: Two times a day (BID) | ORAL | Status: DC
  Administered 2013-09-18 – 2013-10-01 (×26): 162 mg via ORAL
  Filled 2013-09-18 (×29): qty 2

## 2013-09-18 MED ORDER — SODIUM CHLORIDE 0.9 % IV BOLUS (SEPSIS)
500.0000 mL | Freq: Once | INTRAVENOUS | Status: AC
  Administered 2013-09-18: 500 mL via INTRAVENOUS

## 2013-09-18 MED ORDER — ISOSORBIDE MONONITRATE ER 60 MG PO TB24
60.0000 mg | ORAL_TABLET | Freq: Two times a day (BID) | ORAL | Status: DC
  Administered 2013-09-18 – 2013-09-20 (×5): 60 mg via ORAL
  Filled 2013-09-18 (×7): qty 1

## 2013-09-18 MED ORDER — HEPARIN SODIUM (PORCINE) 5000 UNIT/ML IJ SOLN
5000.0000 [IU] | Freq: Three times a day (TID) | INTRAMUSCULAR | Status: DC
  Administered 2013-09-18 – 2013-09-20 (×3): 5000 [IU] via SUBCUTANEOUS
  Filled 2013-09-18 (×8): qty 1

## 2013-09-18 MED ORDER — PANTOPRAZOLE SODIUM 40 MG PO TBEC
40.0000 mg | DELAYED_RELEASE_TABLET | Freq: Every day | ORAL | Status: DC
  Administered 2013-09-20 – 2013-10-01 (×12): 40 mg via ORAL
  Filled 2013-09-18 (×12): qty 1

## 2013-09-18 MED ORDER — MORPHINE SULFATE 4 MG/ML IJ SOLN
4.0000 mg | Freq: Once | INTRAMUSCULAR | Status: AC
  Administered 2013-09-18: 4 mg via INTRAVENOUS
  Filled 2013-09-18: qty 1

## 2013-09-18 MED ORDER — POLYSACCHARIDE IRON COMPLEX 150 MG PO CAPS
150.0000 mg | ORAL_CAPSULE | Freq: Every day | ORAL | Status: DC
  Administered 2013-09-19 – 2013-10-01 (×13): 150 mg via ORAL
  Filled 2013-09-18 (×13): qty 1

## 2013-09-18 MED ORDER — POTASSIUM CHLORIDE CRYS ER 20 MEQ PO TBCR
30.0000 meq | EXTENDED_RELEASE_TABLET | Freq: Every day | ORAL | Status: DC
  Administered 2013-09-19 – 2013-10-01 (×13): 30 meq via ORAL
  Filled 2013-09-18 (×13): qty 1

## 2013-09-18 MED ORDER — FENTANYL 25 MCG/HR TD PT72
25.0000 ug | MEDICATED_PATCH | TRANSDERMAL | Status: DC
  Administered 2013-09-21 – 2013-09-30 (×4): 25 ug via TRANSDERMAL
  Filled 2013-09-18 (×4): qty 1

## 2013-09-18 MED ORDER — AMITRIPTYLINE HCL 100 MG PO TABS
100.0000 mg | ORAL_TABLET | Freq: Every day | ORAL | Status: DC
  Filled 2013-09-18: qty 1

## 2013-09-18 MED ORDER — FOLIC ACID 0.5 MG HALF TAB
0.5000 mg | ORAL_TABLET | Freq: Every day | ORAL | Status: DC
  Administered 2013-09-20 – 2013-10-01 (×12): 0.5 mg via ORAL
  Filled 2013-09-18 (×14): qty 1

## 2013-09-18 MED ORDER — IRBESARTAN 75 MG PO TABS
37.5000 mg | ORAL_TABLET | Freq: Every day | ORAL | Status: DC
  Administered 2013-09-20: 37.5 mg via ORAL
  Filled 2013-09-18 (×3): qty 0.5

## 2013-09-18 MED ORDER — LATANOPROST 0.005 % OP SOLN
1.0000 [drp] | Freq: Every day | OPHTHALMIC | Status: DC
  Administered 2013-09-18 – 2013-09-30 (×14): 1 [drp] via OPHTHALMIC
  Filled 2013-09-18 (×3): qty 2.5

## 2013-09-18 MED ORDER — CLOPIDOGREL BISULFATE 75 MG PO TABS
75.0000 mg | ORAL_TABLET | Freq: Every day | ORAL | Status: DC
  Administered 2013-09-19 – 2013-10-01 (×13): 75 mg via ORAL
  Filled 2013-09-18 (×13): qty 1

## 2013-09-18 MED ORDER — PREDNISONE 10 MG PO TABS
10.0000 mg | ORAL_TABLET | Freq: Every day | ORAL | Status: DC
  Filled 2013-09-18: qty 1

## 2013-09-18 MED ORDER — INSULIN ASPART 100 UNIT/ML ~~LOC~~ SOLN
0.0000 [IU] | SUBCUTANEOUS | Status: DC
  Administered 2013-09-18: 9 [IU] via SUBCUTANEOUS
  Administered 2013-09-19: 7 [IU] via SUBCUTANEOUS
  Administered 2013-09-19: 3 [IU] via SUBCUTANEOUS
  Administered 2013-09-20 (×2): 1 [IU] via SUBCUTANEOUS

## 2013-09-18 MED ORDER — SERTRALINE HCL 50 MG PO TABS: 50.0000 mg | ORAL_TABLET | Freq: Every day | ORAL | Status: DC

## 2013-09-18 MED ORDER — AMLODIPINE BESYLATE 2.5 MG PO TABS: 2.5000 mg | ORAL_TABLET | Freq: Every day | ORAL | Status: DC

## 2013-09-18 MED ORDER — ALPRAZOLAM 0.5 MG PO TABS
1.0000 mg | ORAL_TABLET | Freq: Four times a day (QID) | ORAL | Status: DC | PRN
Start: 2013-09-18 — End: 2013-09-21
  Administered 2013-09-19 – 2013-09-20 (×4): 1 mg via ORAL
  Filled 2013-09-18 (×5): qty 2

## 2013-09-18 MED ORDER — POLYSACCHARIDE IRON COMPLEX 150 MG PO CAPS: 150.0000 mg | ORAL_CAPSULE | Freq: Every day | ORAL | Status: DC

## 2013-09-18 MED ORDER — CARVEDILOL 25 MG PO TABS
25.0000 mg | ORAL_TABLET | Freq: Two times a day (BID) | ORAL | Status: DC
  Administered 2013-09-18 – 2013-09-20 (×3): 25 mg via ORAL
  Filled 2013-09-18 (×8): qty 1

## 2013-09-18 NOTE — ED Notes (Signed)
Onset today lives at home and fell pain right hip. Currently pain 10/10 sharp. Alert answering and following commands appropriate.

## 2013-09-18 NOTE — Consult Note (Signed)
ORTHOPAEDIC CONSULTATION  REQUESTING PHYSICIAN: Veryl Speak, MD  Chief Complaint: Right femoral neck fracture  HPI: [name] is a 63 y.o. female who complains of  Pain after a fall secondary to dizzyness from her inner ear. She is on plavix for a stent 23mo ago+/-  Past Medical History  Diagnosis Date  . Respiratory arrest   . COPD (chronic obstructive pulmonary disease)   . CHF (congestive heart failure)   . Diabetes mellitus     insulin-dep  . Fibromyalgia   . Deaf     bilateral neurosensory hearing loss - Dr. Radene Journey  . Myocardial infarction   . Stroke   . GERD (gastroesophageal reflux disease)   . Depression   . Hypertension   . Pneumonia   . Coronary artery disease   . Polymyalgia rheumatica   . Unstable angina   . PAD (peripheral artery disease)     LEAs 05/10/2012 - L SFA 50-69% diameter reduction, bilat ICA with 0-49% diameter reduction  . Tobacco abuse   . History of nuclear stress test 11/2011    lexiscan; low risk, non-diagnostic for ischemia   Past Surgical History  Procedure Laterality Date  . Coronary artery bypass graft  04/03/1993    x5 LIMA to LAD; SVG to DX1; SVG to RCA, SVG to OM;  (Dr. Servando Snare)  . Eye surgery    . Coronary angioplasty  1995    RCA PTCA (07/23/1993); mid-LAD PTCA (08/21/1993); PTCA for restenosis RCA (09/13/1993),   . Coronary angioplasty  1998    Cfx & LAD PTCA  . Coronary angioplasty  1999    Cfx & LAD PTCA  . Coronary angioplasty  09/17/2004    cutting balloon small vessel mid LAD beyond LIMA insertion (2.5x16mm Cypher to mid SVG to RCA  . Coronary angioplasty  05/21/2010    patent LIMA-LAD, diffuse disease distally, patent SVG-small diagonal, patent SVG-right w/widely patent stent, patent SVG-OM1 (Dr. Raliegh Ip. [place] Hilty)  . Transthoracic echocardiogram  12/2010    EF=>55%, mild conc LVH; IVC non-dilated w/>50% collapse; mild mitral annular calcif, calcified mitral apparatus, mild MD; mild TR & elevated RVSP; mild-mod AV regurg   . Coronary angioplasty  07/18/2008    patent SVG to acute marginal & PDA, SVG to OM1, SVG to diagonal 1, & LIMA to LAD (Dr. Jackie Plum(  . Coronary angioplasty  04/20/2007    patent SVG-diagonal, SVG-obtuse marginal, SVG-RCA, LIMA-LAD, patent stent to SVG in RCA (Dr. Norlene Duel)  . Cardiac catheterization  04/10/2007    3x59mm Promus DES to SVG to RCA  . Coronary angioplasty  03/22/2006    patent LIMA to LAD, patent SVG to small diagonal, patent SVG to OM, patent vein graft to RV acute marginal & RCA (Dr. Gerrie Nordmann)  . Coronary angioplasty  12/03/2002    ballon angioplasty to lesion in prox LAD, prox Cfx & stenting, angioplasty to L AV groove artery (Dr. Marella Chimes)  . Coronary angioplasty  09/08/2000    Dr. Marella Chimes   History   Social History  . Marital Status: Single    Spouse Name: N/A    Number of Children: 0  . Years of Education: N/A   Occupational History  . former nurse    Social History Main Topics  . Smoking status: Former Smoker    Quit date: 01/19/2008  . Smokeless tobacco: None  . Alcohol Use: No  . Drug Use: No  . Sexual Activity: Not Currently  Other Topics Concern  . None   Social History Narrative  . None   Family History  Problem Relation Age of Onset  . Hypertension Father    Allergies  Allergen Reactions  . Codeine     Unknown  . Flagyl [Metronidazole]     Unknown  . Fluocinonide     Unknown  . Ranexa [Ranolazine] Other (See Comments)    Elevated liver function  . Statins Other (See Comments)    Muscle aches  . Biaxin [Clarithromycin] Rash  . Demerol [Meperidine] Rash   Prior to Admission medications   Medication Sig Start Date End Date Taking? Authorizing Provider  ALPRAZolam Duanne Moron) 1 MG tablet Take 1 mg by mouth 4 (four) times daily as needed for sleep or anxiety.   Yes Historical Provider, MD  amitriptyline (ELAVIL) 100 MG tablet Take 100 mg by mouth at bedtime.   Yes Historical Provider, MD  amLODipine (NORVASC) 2.5 MG tablet Take  2.5 mg by mouth daily.   Yes Historical Provider, MD  aspirin EC 81 MG tablet Take 162 mg by mouth 2 (two) times daily.   Yes Historical Provider, MD  Calcium Carb-Cholecalciferol 600-500 MG-UNIT CAPS Take 1 capsule by mouth daily.   Yes Historical Provider, MD  carvedilol (COREG) 25 MG tablet Take 25 mg by mouth 2 (two) times daily with a meal.   Yes Historical Provider, MD  clopidogrel (PLAVIX) 75 MG tablet Take 1 tablet (75 mg total) by mouth daily. 11/21/12  Yes Rebecca Eaton, MD  COD LIVER OIL PO Take 1 capsule by mouth daily.    Yes Historical Provider, MD  Cyanocobalamin (VITAMIN B-12 PO) Take 1 tablet by mouth daily.   Yes Historical Provider, MD  diclofenac sodium (VOLTAREN) 1 % GEL Apply 4 g topically daily as needed (muscle pain).    Yes Historical Provider, MD  fentaNYL (DURAGESIC - DOSED MCG/HR) 25 MCG/HR Place 1 patch onto the skin every 3 (three) days.   Yes Historical Provider, MD  folic acid (FOLVITE) 151 MCG tablet Take 1,600 mcg by mouth daily.   Yes Historical Provider, MD  furosemide (LASIX) 40 MG tablet Take 40-60 mg by mouth See admin instructions. Take 40 mg every  morning.  Take 20 mg every other evening.   Yes Historical Provider, MD  insulin glargine (LANTUS) 100 UNIT/ML injection Inject 25 Units into the skin 2 (two) times daily.  10/10/12  Yes Ripudeep Krystal Eaton, MD  insulin lispro (HUMALOG) 100 UNIT/ML injection Inject 5-12 Units into the skin 3 (three) times daily before meals. 5-12 units per sliding scale   Yes Historical Provider, MD  iron polysaccharides (NU-IRON) 150 MG capsule Take 1 capsule (150 mg total) by mouth daily. 10/15/12  Yes Rebecca Eaton, MD  isosorbide mononitrate (IMDUR) 60 MG 24 hr tablet Take 1 tablet (60 mg total) by mouth 2 (two) times daily. 03/22/13  Yes Pixie Casino, MD  Lutein 20 MG CAPS Take 1 capsule by mouth daily.   Yes Historical Provider, MD  nitroGLYCERIN (NITROSTAT) 0.4 MG SL tablet Place 0.4 mg under the tongue every 5 (five)  minutes as needed for chest pain.   Yes Historical Provider, MD  omeprazole (PRILOSEC) 20 MG capsule Take 20 mg by mouth daily.   Yes Historical Provider, MD  oxyCODONE-acetaminophen (PERCOCET) 10-325 MG per tablet Take 1 tablet by mouth every 4 (four) hours as needed for pain.   Yes Historical Provider, MD  potassium chloride SA (K-DUR,KLOR-CON) 20 MEQ tablet Take  30 mEq by mouth daily.   Yes Historical Provider, MD  predniSONE (DELTASONE) 10 MG tablet Take 10 mg by mouth daily.   Yes Historical Provider, MD  Pyridoxine HCl (VITAMIN B-6 PO) Take 1 tablet by mouth daily.   Yes Historical Provider, MD  sertraline (ZOLOFT) 50 MG tablet Take 50 mg by mouth daily.   Yes Historical Provider, MD  timolol (TIMOPTIC) 0.25 % ophthalmic solution Place 1 drop into both eyes daily.    Yes Historical Provider, MD  TRAVATAN Z 0.004 % SOLN ophthalmic solution Place 1 drop into both eyes at bedtime. 06/03/13  Yes Historical Provider, MD  triamcinolone cream (KENALOG) 0.1 % Apply 1 application topically daily. Apply to rash 01/15/13  Yes Historical Provider, MD  valsartan (DIOVAN) 40 MG tablet Take 40 mg by mouth daily.   Yes Historical Provider, MD  vitamin E 400 UNIT capsule Take 400 Units by mouth daily.   Yes Historical Provider, MD  zinc gluconate 50 MG tablet Take 50 mg by mouth daily.   Yes Historical Provider, MD  ZINC GLUCONATE PO Take by mouth.   Yes Historical Provider, MD   Dg Hip Complete Right  09/18/2013   CLINICAL DATA:  Right hip pain secondary to a fall.  EXAM: RIGHT HIP - COMPLETE 2+ VIEW  COMPARISON:  None.  FINDINGS: There is a slightly impacted subcapital fracture or right femoral neck. No displacement.  Pelvic bones are intact.  IMPRESSION: Impacted subcapital fracture of the right femoral neck.   Electronically Signed   By: Rozetta Nunnery M.D.   On: 09/18/2013 18:59    Positive ROS: All other systems have been reviewed and were otherwise negative with the exception of those mentioned in the HPI  and as above.  Labs cbc  Recent Labs  09/18/13 1825  WBC 10.0  HGB 11.3*  HCT 33.7*  PLT 364    Labs inflam No results found for this basename: ESR, CRP,  in the last 72 hours  Labs coag  Recent Labs  09/18/13 1825  INR 0.94     Recent Labs  09/18/13 1825  NA 134*  K 4.4  CL 91*  CO2 29  GLUCOSE 431*  BUN 20  CREATININE 0.94  CALCIUM 9.1    Physical Exam: Filed Vitals:   09/18/13 2000  BP: 146/50  Pulse: 77  Temp:   Resp: 16   General: Alert, no acute distress. Very hard of hearing Cardiovascular: No pedal edema Respiratory: No cyanosis, no use of accessory musculature GI: No organomegaly, abdomen is soft and non-tender Skin: No lesions in the area of chief complaint Neurologic: Sensation intact distally Psychiatric: Patient is competent for consent with normal mood and affect Lymphatic: No axillary or cervical lymphadenopathy  MUSCULOSKELETAL:  RLE: Pain with log roll, skin benign, distally very little sensation 2/2 neuropathy, wiggles toes, WWP Other extremities are atraumatic with painless ROM and NVI.  Assessment: R fem neck fracture  Plan: We had a long discussion about options and we feel that it is worth doint a hip pinning and continuing plavix given her risk of event off of plavix and her high risk for a larger surgery. Patient and her family understand the risk of AVN with her small amount of displacment but they agree that we should attempt a hip pinning and undergo this risk.   We will continue he anticoagulation given her high risk because of the small incision size and minimal dissection for a pinning.   Weight Bearing Status:  Bedrest for now, WBAT post op PT VTE px: SCD's and hold 2pm heparin dose but continue ASA and plavix in the peri-op period.   Edmonia Lynch, D, MD Cell [phone]   09/18/2013 8:38 PM

## 2013-09-18 NOTE — ED Provider Notes (Signed)
CSN: [id number]     Arrival date & time 09/18/13  1749 History   First MD Initiated Contact with Patient 09/18/13 1755     Chief Complaint  Patient presents with  . Fall  . Hip Pain     (Consider location/radiation/quality/duration/timing/severity/associated sxs/prior Treatment) HPI Comments: Patient is a 63 year old female with extensive past medical history including COPD, CHF, diabetes, coronary artery disease. She has some sort of balance problems related to hearing loss that causes her occasional instability. She states today she became dizzy and fell backward injuring her right hip. She complains of severe pain and has been unable to ambulate since. She denies any other injury during the fall.  Patient is a 63 y.o. female presenting with fall. The history is provided by the patient.  Fall This is a new problem. The current episode started 1 to 2 hours ago. The problem occurs constantly. The problem has not changed since onset.Exacerbated by: Movement and palpation. Nothing relieves the symptoms. She has tried nothing for the symptoms.    Past Medical History  Diagnosis Date  . Respiratory arrest   . COPD (chronic obstructive pulmonary disease)   . CHF (congestive heart failure)   . Diabetes mellitus     insulin-dep  . Fibromyalgia   . Deaf     bilateral neurosensory hearing loss - Dr. Narda Bondshris Newman  . Myocardial infarction   . Stroke   . GERD (gastroesophageal reflux disease)   . Depression   . Hypertension   . Pneumonia   . Coronary artery disease   . Polymyalgia rheumatica   . Unstable angina   . PAD (peripheral artery disease)     LEAs 05/10/2012 - L SFA 50-69% diameter reduction, bilat ICA with 0-49% diameter reduction  . Tobacco abuse   . History of nuclear stress test 11/2011    lexiscan; low risk, non-diagnostic for ischemia   Past Surgical History  Procedure Laterality Date  . Coronary artery bypass graft  04/03/1993    x5 LIMA to LAD; SVG to DX1; SVG to RCA,  SVG to OM;  (Dr. Tyrone SageGerhardt)  . Eye surgery    . Coronary angioplasty  1995    RCA PTCA (07/23/1993); mid-LAD PTCA (08/21/1993); PTCA for restenosis RCA (09/13/1993),   . Coronary angioplasty  1998    Cfx & LAD PTCA  . Coronary angioplasty  1999    Cfx & LAD PTCA  . Coronary angioplasty  09/17/2004    cutting balloon small vessel mid LAD beyond LIMA insertion (2.5x6613mm Cypher to mid SVG to RCA  . Coronary angioplasty  05/21/2010    patent LIMA-LAD, diffuse disease distally, patent SVG-small diagonal, patent SVG-right w/widely patent stent, patent SVG-OM1 (Dr. Kirtland BouchardK. [place] Hilty)  . Transthoracic echocardiogram  12/2010    EF=>55%, mild conc LVH; IVC non-dilated w/>50% collapse; mild mitral annular calcif, calcified mitral apparatus, mild MD; mild TR & elevated RVSP; mild-mod AV regurg  . Coronary angioplasty  07/18/2008    patent SVG to acute marginal & PDA, SVG to OM1, SVG to diagonal 1, & LIMA to LAD (Dr. Evlyn CourierJ. Gangi(  . Coronary angioplasty  04/20/2007    patent SVG-diagonal, SVG-obtuse marginal, SVG-RCA, LIMA-LAD, patent stent to SVG in RCA (Dr. Claudia DesanctisH. Solomon)  . Cardiac catheterization  04/10/2007    3x3323mm Promus DES to SVG to RCA  . Coronary angioplasty  03/22/2006    patent LIMA to LAD, patent SVG to small diagonal, patent SVG to OM, patent vein graft to RV acute marginal &  RCA (Dr. Laurell Josephs. McQueen)  . Coronary angioplasty  12/03/2002    ballon angioplasty to lesion in prox LAD, prox Cfx & stenting, angioplasty to L AV groove artery (Dr. Jonette Eva. Weintraub)  . Coronary angioplasty  09/08/2000    Dr. Jonette Eva. Weintraub   Family History  Problem Relation Age of Onset  . Hypertension Father    History  Substance Use Topics  . Smoking status: Former Smoker    Quit date: 01/19/2008  . Smokeless tobacco: Not on file  . Alcohol Use: No   OB History   Grav Para Term Preterm Abortions TAB SAB Ect Mult Living                 Review of Systems  All other systems reviewed and are negative.     Allergies  Codeine;  Flagyl; Fluocinonide; Ranexa; Statins; Biaxin; and Demerol  Home Medications   Prior to Admission medications   Medication Sig Start Date End Date Taking? Authorizing Provider  ALPRAZolam Prudy Feeler(XANAX) 1 MG tablet Take 1 mg by mouth 4 (four) times daily as needed for sleep or anxiety.    Historical Provider, MD  amitriptyline (ELAVIL) 100 MG tablet Take 100 mg by mouth at bedtime.    Historical Provider, MD  amLODipine (NORVASC) 2.5 MG tablet Take 2.5 mg by mouth daily.    Historical Provider, MD  Calcium Carbonate-Vitamin D (CALCIUM + D PO) Take 1 tablet by mouth daily.    Historical Provider, MD  carvedilol (COREG) 25 MG tablet Take 25 mg by mouth 2 (two) times daily with a meal.    Historical Provider, MD  Cholecalciferol (VITAMIN D PO) Take 1 tablet by mouth daily.    Historical Provider, MD  clopidogrel (PLAVIX) 75 MG tablet Take 1 tablet (75 mg total) by mouth daily. 11/21/12   Governor Rooksichard A Weintraub, MD  COD LIVER OIL PO Take by mouth.    Historical Provider, MD  Cyanocobalamin (VITAMIN B 12 PO) Take 1 tablet by mouth daily.    Historical Provider, MD  diclofenac sodium (VOLTAREN) 1 % GEL Apply 4 g topically daily.    Historical Provider, MD  fentaNYL (DURAGESIC - DOSED MCG/HR) 25 MCG/HR Place 1 patch onto the skin every 3 (three) days.    Historical Provider, MD  folic acid (FOLVITE) 800 MCG tablet Take 1,600 mcg by mouth daily.    Historical Provider, MD  insulin glargine (LANTUS) 100 UNIT/ML injection Inject 20 Units into the skin at bedtime.  10/10/12   Ripudeep Jenna LuoK Rai, MD  insulin lispro (HUMALOG) 100 UNIT/ML injection Inject 5-12 Units into the skin 3 (three) times daily before meals. 5-12 units per sliding scale    Historical Provider, MD  iron polysaccharides (NU-IRON) 150 MG capsule Take 1 capsule (150 mg total) by mouth daily. 10/15/12   Governor Rooksichard A Weintraub, MD  isosorbide mononitrate (IMDUR) 60 MG 24 hr tablet Take 1 tablet (60 mg total) by mouth 2 (two) times daily. 03/22/13   Chrystie NoseKenneth C.  Hilty, MD  multivitamin-lutein [facility](OCUVITE-LUTEIN) CAPS capsule Take 1 capsule by mouth daily. 20 mg    Historical Provider, MD  nitroGLYCERIN (NITROSTAT) 0.4 MG SL tablet Place 0.4 mg under the tongue every 5 (five) minutes as needed for chest pain.    Historical Provider, MD  omeprazole (PRILOSEC) 20 MG capsule Take 20 mg by mouth daily.    Historical Provider, MD  oxyCODONE-acetaminophen (PERCOCET) 10-325 MG per tablet Take 1 tablet by mouth every 4 (four) hours as needed for pain.  Historical Provider, MD  predniSONE (DELTASONE) 10 MG tablet Take 10 mg by mouth daily.    Historical Provider, MD  sertraline (ZOLOFT) 50 MG tablet Take 50 mg by mouth daily.    Historical Provider, MD  timolol (TIMOPTIC) 0.25 % ophthalmic solution Place 1 drop into both eyes 2 (two) times daily.    Historical Provider, MD  TRAVATAN Z 0.004 % SOLN ophthalmic solution Place 1 drop into both eyes at bedtime. 06/03/13   Historical Provider, MD  triamcinolone cream (KENALOG) 0.1 %  01/15/13   Historical Provider, MD  valsartan (DIOVAN) 40 MG tablet Take 40 mg by mouth daily.    Historical Provider, MD  vitamin E 400 UNIT capsule Take 400 Units by mouth daily.    Historical Provider, MD  ZINC GLUCONATE PO Take by mouth.    Historical Provider, MD   BP 127/51  Pulse 72  Temp(Src) 98.1 F (36.7 C) (Oral)  Resp 18  Ht 5\' 4"  (1.626 m)  Wt 160 lb (72.576 kg)  BMI 27.45 kg/m2  SpO2 95% Physical Exam  Nursing note and vitals reviewed. Constitutional: She is oriented to person, place, and time. She appears well-developed and well-nourished. No distress.  HENT:  Head: Normocephalic and atraumatic.  Neck: Normal range of motion. Neck supple.  Cardiovascular: Normal rate and regular rhythm.  Exam reveals no gallop and no friction rub.   No murmur heard. Pulmonary/Chest: Effort normal and breath sounds normal. No respiratory distress. She has no wheezes.  Abdominal: Soft. Bowel sounds are normal. She exhibits no  distension. There is no tenderness.  Musculoskeletal: Normal range of motion.  There is exquisite tenderness to palpation over the lateral aspect of the right hip. There is significant discomfort with any range of motion. Dorsalis pedis pulses are palpable and symmetrical. Motor and sensory are intact to the right foot.  Neurological: She is alert and oriented to person, place, and time.  Skin: Skin is warm and dry. She is not diaphoretic.    ED Course  Procedures (including critical care time) Labs Review Labs Reviewed  CBC WITH DIFFERENTIAL  COMPREHENSIVE METABOLIC PANEL  PROTIME-INR    Imaging Review No results found.   EKG Interpretation None      MDM   Final diagnoses:  None    Workup reveals a subcapital fracture of the right femoral neck. I discussed this with Dr. Eulah Pont from orthopedics who is suggested the patient be admitted to the hospitalist service. His plans are to repair the hip on Thursday if medical clearance is obtained. If not he plans to repair the hip on Friday.    Geoffery Lyons, MD 09/18/13 [phone]

## 2013-09-18 NOTE — ED Notes (Signed)
Pt given [place] sandwich and water. Per EDP, pt to have surgery tomorrow and not tonight.  Update given to sister [name] on the phone.  Pt authorized this RN to give update to sister on the phone due to pt being HOH.

## 2013-09-18 NOTE — ED Notes (Signed)
Admitting MD at bedside.

## 2013-09-18 NOTE — Progress Notes (Signed)
PHARMACIST - PHYSICIAN ORDER COMMUNICATION  CONCERNING: P&T Medication Policy on Herbal Medications  DESCRIPTION:  This patient's order for:  Lutein  has been noted.  This product(s) is classified as an "herbal" or natural product. Due to a lack of definitive safety studies or FDA approval, nonstandard manufacturing practices, plus the potential risk of unknown drug-drug interactions while on inpatient medications, the Pharmacy and Therapeutics Committee does not permit the use of "herbal" or natural products of this type within [facility].   ACTION TAKEN: The pharmacy department is unable to verify this order at this time and your patient has been informed of this safety policy. Please reevaluate patient's clinical condition at discharge and address if the herbal or natural product(s) should be resumed at that time.  Georgina PillionElizabeth Jessie Cowher, PharmD, BCPS Clinical Pharmacist Pager: [phone] 09/18/2013 9:21 PM

## 2013-09-18 NOTE — ED Notes (Signed)
Pt's belongings placed in white bag: watch, navy shirt, grey shirt and bottoms.

## 2013-09-18 NOTE — H&P (Addendum)
Hospitalist Admission History and Physical  Patient name: [name] Medical record number: [id number] Date of birth: 11-04-1950 Age: 63 y.o. Gender: female  Primary Care Provider: Fredirick MaudlinHAWKINS,EDWARD L, MD  Chief Complaint: fall , hip fracture  History of Present Illness:This is a 63 y.o. year old female with multiple medical problems including CAD s/p CABG with recent MI 01/2013, IDDM, COPD, CHF, deafness w/ secondary inner ear disorder  presenting with fall and hip fracture. Pt states that she was at home, she turned her head and became dizzy and fell. Pt states that she subsequently fell, landing on her R hip and head. Pt states that she has recurring episodes of positional dizziness on a regular basis. Uses a rolling walker at home, though is not compliant. Denies any CP, SOB, nausea.  Presented to ER, hemodynamically stable. WBC 10, hgb 11.3, na 134, Cr 0.94, Glu 431. R hip xray with impacted subcapital fracture of R femoral neck. Ortho consulted. Would like to send pt to OR. Would like medical clearance prior to procedure.   Assessment and Plan: [name] is a 63 y.o. year old female presenting with hip fracture    Hip fracture: per ortho, pain control.   CAD/CHF: Extensive cardiac history. Clinically stable currently. No signs of volume overload. No active CP. Continue home regimen. Cards consult. Tele bed.   DM: SSI. A1C.   Fibromyalgia: on chronic pain meds. Continue fentanyl. Hold percocet. Prn dilaudid for hip fracture.   PMR: continue prednisone.  FEN/GI: NPO PMN. PPI  Prophylaxis: sub heparin  Disposition: pending further evaluation  Code Status:Full Code    Patient Active Problem List   Diagnosis Date Noted  . Hip fracture 09/18/2013  . Hearing loss sensory, bilateral 01/21/2013  . Peripheral autonomic neuropathy due to diabetes mellitus 01/21/2013  . Proliferative diabetic retinopathy of both eyes 01/21/2013  . Diabetes mellitus type 1, uncontrolled, insulin  dependent 01/09/2013  . Polymyalgia rheumatica 01/09/2013  . HTN (hypertension) 01/09/2013  . CAD (coronary artery disease) 01/09/2013  . NSTEMI (non-ST elevated myocardial infarction) 01/07/2013  . Chest pain 10/10/2012  . Bleeding 10/10/2012   Past Medical History: Past Medical History  Diagnosis Date  . Respiratory arrest   . COPD (chronic obstructive pulmonary disease)   . CHF (congestive heart failure)   . Diabetes mellitus     insulin-dep  . Fibromyalgia   . Deaf     bilateral neurosensory hearing loss - Dr. Narda Bondshris Newman  . Myocardial infarction   . Stroke   . GERD (gastroesophageal reflux disease)   . Depression   . Hypertension   . Pneumonia   . Coronary artery disease   . Polymyalgia rheumatica   . Unstable angina   . PAD (peripheral artery disease)     LEAs 05/10/2012 - L SFA 50-69% diameter reduction, bilat ICA with 0-49% diameter reduction  . Tobacco abuse   . History of nuclear stress test 11/2011    lexiscan; low risk, non-diagnostic for ischemia    Past Surgical History: Past Surgical History  Procedure Laterality Date  . Coronary artery bypass graft  04/03/1993    x5 LIMA to LAD; SVG to DX1; SVG to RCA, SVG to OM;  (Dr. Tyrone SageGerhardt)  . Eye surgery    . Coronary angioplasty  1995    RCA PTCA (07/23/1993); mid-LAD PTCA (08/21/1993); PTCA for restenosis RCA (09/13/1993),   . Coronary angioplasty  1998    Cfx & LAD PTCA  . Coronary angioplasty  1999  Cfx & LAD PTCA  . Coronary angioplasty  09/17/2004    cutting balloon small vessel mid LAD beyond LIMA insertion (2.5x26mm Cypher to mid SVG to RCA  . Coronary angioplasty  05/21/2010    patent LIMA-LAD, diffuse disease distally, patent SVG-small diagonal, patent SVG-right w/widely patent stent, patent SVG-OM1 (Dr. Kirtland Bouchard. [place])  . Transthoracic echocardiogram  12/2010    EF=>55%, mild conc LVH; IVC non-dilated w/>50% collapse; mild mitral annular calcif, calcified mitral apparatus, mild MD; mild TR & elevated RVSP;  mild-mod AV regurg  . Coronary angioplasty  07/18/2008    patent SVG to acute marginal & PDA, SVG to OM1, SVG to diagonal 1, & LIMA to LAD (Dr. Evlyn Courier(  . Coronary angioplasty  04/20/2007    patent SVG-diagonal, SVG-obtuse marginal, SVG-RCA, LIMA-LAD, patent stent to SVG in RCA (Dr. Claudia Desanctis)  . Cardiac catheterization  04/10/2007    3x17mm Promus DES to SVG to RCA  . Coronary angioplasty  03/22/2006    patent LIMA to LAD, patent SVG to small diagonal, patent SVG to OM, patent vein graft to RV acute marginal & RCA (Dr. Laurell Josephs)  . Coronary angioplasty  12/03/2002    ballon angioplasty to lesion in prox LAD, prox Cfx & stenting, angioplasty to L AV groove artery (Dr. Jonette Eva)  . Coronary angioplasty  09/08/2000    Dr. Jonette Eva    Social History: History   Social History  . Marital Status: Single    Spouse Name: N/A    Number of Children: 0  . Years of Education: N/A   Occupational History  . former nurse    Social History Main Topics  . Smoking status: Former Smoker    Quit date: 01/19/2008  . Smokeless tobacco: None  . Alcohol Use: No  . Drug Use: No  . Sexual Activity: Not Currently   Other Topics Concern  . None   Social History Narrative  . None    Family History: Family History  Problem Relation Age of Onset  . Hypertension Father     Allergies: Allergies  Allergen Reactions  . Codeine     Unknown  . Flagyl [Metronidazole]     Unknown  . Fluocinonide     Unknown  . Ranexa [Ranolazine] Other (See Comments)    Elevated liver function  . Statins Other (See Comments)    Muscle aches  . Biaxin [Clarithromycin] Rash  . Demerol [Meperidine] Rash    Current Facility-Administered Medications  Medication Dose Route Frequency Provider Last Rate Last Dose  . ALPRAZolam Prudy Feeler) tablet 1 mg  1 mg Oral QID PRN Doree Albee, MD      . amitriptyline (ELAVIL) tablet 100 mg  100 mg Oral QHS Doree Albee, MD      . amLODipine (NORVASC) tablet 2.5 mg  2.5  mg Oral Daily Doree Albee, MD      . aspirin EC tablet 162 mg  162 mg Oral BID Doree Albee, MD      . Calcium Carb-Cholecalciferol 600-500 MG-UNIT CAPS 1 capsule  1 capsule Oral Daily Doree Albee, MD      . Melene Muller ON 09/19/2013] carvedilol (COREG) tablet 25 mg  25 mg Oral BID WC Doree Albee, MD      . clopidogrel (PLAVIX) tablet 75 mg  75 mg Oral Daily Doree Albee, MD      . fentaNYL (DURAGESIC - dosed mcg/hr) patch 25 mcg  25 mcg Transdermal Q72H Doree Albee, MD      .  folic acid (FOLVITE) tablet 1,600 mcg  1,600 mcg Oral Daily Doree AlbeeSteven Dallas Torok, MD      . furosemide (LASIX) tablet 40-60 mg  40-60 mg Oral See admin instructions Doree AlbeeSteven Chaka Boyson, MD      . heparin injection 5,000 Units  5,000 Units Subcutaneous 3 times per day Doree AlbeeSteven Telford Archambeau, MD      . HYDROmorphone (DILAUDID) injection 0.5-1 mg  0.5-1 mg Intravenous Q2H PRN Doree AlbeeSteven Armani Gawlik, MD      . insulin glargine (LANTUS) injection 25 Units  25 Units Subcutaneous BID Doree AlbeeSteven Briseis Aguilera, MD      . irbesartan (AVAPRO) tablet 37.5 mg  37.5 mg Oral Daily Doree AlbeeSteven Tan Clopper, MD      . iron polysaccharides (NIFEREX) capsule 150 mg  150 mg Oral Daily Doree AlbeeSteven France Noyce, MD      . isosorbide mononitrate (IMDUR) 24 hr tablet 60 mg  60 mg Oral BID Doree AlbeeSteven Rickard Kennerly, MD      . latanoprost (XALATAN) 0.005 % ophthalmic solution 1 drop  1 drop Both Eyes QHS Doree AlbeeSteven Citlalli Weikel, MD      . Lutein CAPS 20 mg  1 capsule Oral Daily Doree AlbeeSteven Macauley Mossberg, MD      . nitroGLYCERIN (NITROSTAT) SL tablet 0.4 mg  0.4 mg Sublingual Q5 min PRN Doree AlbeeSteven Keita Demarco, MD      . pantoprazole (PROTONIX) EC tablet 40 mg  40 mg Oral Daily Doree AlbeeSteven Dyana Magner, MD      . potassium chloride SA (K-DUR,KLOR-CON) CR tablet 30 mEq  30 mEq Oral Daily Doree AlbeeSteven Spero Gunnels, MD      . predniSONE (DELTASONE) tablet 10 mg  10 mg Oral Daily Doree AlbeeSteven Aslyn Cottman, MD      . sertraline (ZOLOFT) tablet 50 mg  50 mg Oral Daily Doree AlbeeSteven Jaylene Schrom, MD      . sodium chloride 0.9 % injection 3 mL  3 mL Intravenous Q12H Doree AlbeeSteven Zaylin Runco, MD      . timolol  (TIMOPTIC) 0.25 % ophthalmic solution 1 drop  1 drop Both Eyes Daily Doree AlbeeSteven Angala Hilgers, MD       Current Outpatient Prescriptions  Medication Sig Dispense Refill  . ALPRAZolam (XANAX) 1 MG tablet Take 1 mg by mouth 4 (four) times daily as needed for sleep or anxiety.      Marland Kitchen. amitriptyline (ELAVIL) 100 MG tablet Take 100 mg by mouth at bedtime.      Marland Kitchen. amLODipine (NORVASC) 2.5 MG tablet Take 2.5 mg by mouth daily.      Marland Kitchen. aspirin EC 81 MG tablet Take 162 mg by mouth 2 (two) times daily.      . Calcium Carb-Cholecalciferol 600-500 MG-UNIT CAPS Take 1 capsule by mouth daily.      . carvedilol (COREG) 25 MG tablet Take 25 mg by mouth 2 (two) times daily with a meal.      . clopidogrel (PLAVIX) 75 MG tablet Take 1 tablet (75 mg total) by mouth daily.  30 tablet  11  . COD LIVER OIL PO Take 1 capsule by mouth daily.       . Cyanocobalamin (VITAMIN B-12 PO) Take 1 tablet by mouth daily.      . diclofenac sodium (VOLTAREN) 1 % GEL Apply 4 g topically daily as needed (muscle pain).       . fentaNYL (DURAGESIC - DOSED MCG/HR) 25 MCG/HR Place 1 patch onto the skin every 3 (three) days.      . folic acid (FOLVITE) 800 MCG tablet Take 1,600 mcg by mouth daily.      . furosemide (  LASIX) 40 MG tablet Take 40-60 mg by mouth See admin instructions. Take 40 mg every  morning.  Take 20 mg every other evening.      . insulin glargine (LANTUS) 100 UNIT/ML injection Inject 25 Units into the skin 2 (two) times daily.       . insulin lispro (HUMALOG) 100 UNIT/ML injection Inject 5-12 Units into the skin 3 (three) times daily before meals. 5-12 units per sliding scale      . iron polysaccharides (NU-IRON) 150 MG capsule Take 1 capsule (150 mg total) by mouth daily.      . isosorbide mononitrate (IMDUR) 60 MG 24 hr tablet Take 1 tablet (60 mg total) by mouth 2 (two) times daily.  60 tablet  10  . Lutein 20 MG CAPS Take 1 capsule by mouth daily.      . nitroGLYCERIN (NITROSTAT) 0.4 MG SL tablet Place 0.4 mg under the tongue  every 5 (five) minutes as needed for chest pain.      Marland Kitchen omeprazole (PRILOSEC) 20 MG capsule Take 20 mg by mouth daily.      Marland Kitchen oxyCODONE-acetaminophen (PERCOCET) 10-325 MG per tablet Take 1 tablet by mouth every 4 (four) hours as needed for pain.      . potassium chloride SA (K-DUR,KLOR-CON) 20 MEQ tablet Take 30 mEq by mouth daily.      . predniSONE (DELTASONE) 10 MG tablet Take 10 mg by mouth daily.      . Pyridoxine HCl (VITAMIN B-6 PO) Take 1 tablet by mouth daily.      . sertraline (ZOLOFT) 50 MG tablet Take 50 mg by mouth daily.      . timolol (TIMOPTIC) 0.25 % ophthalmic solution Place 1 drop into both eyes daily.       . TRAVATAN Z 0.004 % SOLN ophthalmic solution Place 1 drop into both eyes at bedtime.      . triamcinolone cream (KENALOG) 0.1 % Apply 1 application topically daily. Apply to rash      . valsartan (DIOVAN) 40 MG tablet Take 40 mg by mouth daily.      . vitamin E 400 UNIT capsule Take 400 Units by mouth daily.      Marland Kitchen zinc gluconate 50 MG tablet Take 50 mg by mouth daily.      Marland Kitchen ZINC GLUCONATE PO Take by mouth.       Review Of Systems: 12 point ROS negative except as noted above in HPI.  Physical Exam: Filed Vitals:   09/18/13 2000  BP: 146/50  Pulse: 77  Temp:   Resp: 16    General: alert and cooperative HEENT: PERRLA and extra ocular movement intact Heart: S1, S2 normal, no murmur, rub or gallop, regular rate and rhythm Lungs: clear to auscultation, no wheezes or rales and unlabored breathing Abdomen: abdomen is soft without significant tenderness, masses, organomegaly or guarding Extremities: R hip TTP, decreased ROM of RLE 2/2 pain  Skin:no rashes, no ecchymoses Neurology: normal without focal findings  Labs and Imaging: Lab Results  Component Value Date/Time   NA 134* 09/18/2013  6:25 PM   K 4.4 09/18/2013  6:25 PM   CL 91* 09/18/2013  6:25 PM   CO2 29 09/18/2013  6:25 PM   BUN 20 09/18/2013  6:25 PM   CREATININE 0.94 09/18/2013  6:25 PM   GLUCOSE 431*  09/18/2013  6:25 PM   Lab Results  Component Value Date   WBC 10.0 09/18/2013   HGB 11.3* 09/18/2013  HCT 33.7* 09/18/2013   MCV 89.9 09/18/2013   PLT 364 09/18/2013    Dg Hip Complete Right  09/18/2013   CLINICAL DATA:  Right hip pain secondary to a fall.  EXAM: RIGHT HIP - COMPLETE 2+ VIEW  COMPARISON:  None.  FINDINGS: There is a slightly impacted subcapital fracture or right femoral neck. No displacement.  Pelvic bones are intact.  IMPRESSION: Impacted subcapital fracture of the right femoral neck.   Electronically Signed   By: Geanie Cooley M.D.   On: 09/18/2013 18:59           Doree Albee MD  Pager: [phone]

## 2013-09-19 ENCOUNTER — Inpatient Hospital Stay (HOSPITAL_COMMUNITY): Payer: Medicare Other | Admitting: Anesthesiology

## 2013-09-19 ENCOUNTER — Encounter (HOSPITAL_COMMUNITY): Payer: Medicare Other | Admitting: Anesthesiology

## 2013-09-19 ENCOUNTER — Inpatient Hospital Stay (HOSPITAL_COMMUNITY): Payer: Medicare Other

## 2013-09-19 ENCOUNTER — Encounter (HOSPITAL_COMMUNITY)
Admission: EM | Disposition: A | Discharge status: Skilled Nursing Facility | Payer: Self-pay | Source: Home / Self Care | Attending: Internal Medicine

## 2013-09-19 ENCOUNTER — Encounter (HOSPITAL_COMMUNITY): Payer: Self-pay | Admitting: Anesthesiology

## 2013-09-19 DIAGNOSIS — I5022 Chronic systolic (congestive) heart failure: Secondary | ICD-10-CM

## 2013-09-19 DIAGNOSIS — I209 Angina pectoris, unspecified: Secondary | ICD-10-CM

## 2013-09-19 DIAGNOSIS — E1065 Type 1 diabetes mellitus with hyperglycemia: Secondary | ICD-10-CM

## 2013-09-19 DIAGNOSIS — Z01810 Encounter for preprocedural cardiovascular examination: Secondary | ICD-10-CM

## 2013-09-19 DIAGNOSIS — IMO0002 Reserved for concepts with insufficient information to code with codable children: Secondary | ICD-10-CM

## 2013-09-19 DIAGNOSIS — I2510 Atherosclerotic heart disease of native coronary artery without angina pectoris: Secondary | ICD-10-CM

## 2013-09-19 DIAGNOSIS — S72009A Fracture of unspecified part of neck of unspecified femur, initial encounter for closed fracture: Secondary | ICD-10-CM

## 2013-09-19 HISTORY — PX: HIP PINNING,CANNULATED: SHX1758

## 2013-09-19 LAB — GLUCOSE, CAPILLARY
GLUCOSE-CAPILLARY: 109 mg/dL — AB (ref 70–99)
GLUCOSE-CAPILLARY: 124 mg/dL — AB (ref 70–99)
GLUCOSE-CAPILLARY: 159 mg/dL — AB (ref 70–99)
GLUCOSE-CAPILLARY: 165 mg/dL — AB (ref 70–99)
GLUCOSE-CAPILLARY: 172 mg/dL — AB (ref 70–99)
GLUCOSE-CAPILLARY: 190 mg/dL — AB (ref 70–99)
GLUCOSE-CAPILLARY: 309 mg/dL — AB (ref 70–99)
GLUCOSE-CAPILLARY: 45 mg/dL — AB (ref 70–99)
GLUCOSE-CAPILLARY: 86 mg/dL (ref 70–99)
Glucose-Capillary: 137 mg/dL — ABNORMAL HIGH (ref 70–99)
Glucose-Capillary: 137 mg/dL — ABNORMAL HIGH (ref 70–99)
Glucose-Capillary: 167 mg/dL — ABNORMAL HIGH (ref 70–99)
Glucose-Capillary: 202 mg/dL — ABNORMAL HIGH (ref 70–99)
Glucose-Capillary: 66 mg/dL — ABNORMAL LOW (ref 70–99)

## 2013-09-19 LAB — COMPREHENSIVE METABOLIC PANEL
ALT: 14 U/L (ref 0–35)
ANION GAP: 15 (ref 5–15)
AST: 17 U/L (ref 0–37)
Albumin: 3.1 g/dL — ABNORMAL LOW (ref 3.5–5.2)
Alkaline Phosphatase: 84 U/L (ref 39–117)
BUN: 14 mg/dL (ref 6–23)
CALCIUM: 8.8 mg/dL (ref 8.4–10.5)
CO2: 25 mEq/L (ref 19–32)
Chloride: 101 mEq/L (ref 96–112)
Creatinine, Ser: 0.8 mg/dL (ref 0.50–1.10)
GFR calc Af Amer: >90 mL/min (ref >90)
GFR calc non Af Amer: 78 mL/min — ABNORMAL LOW (ref >90)
Glucose, Bld: 48 mg/dL — ABNORMAL LOW (ref 70–99)
Potassium: 3.7 mEq/L (ref 3.7–5.3)
Sodium: 141 mEq/L (ref 137–147)
TOTAL PROTEIN: 6.7 g/dL (ref 6.0–8.3)
Total Bilirubin: <0.2 mg/dL — ABNORMAL LOW (ref 0.3–1.2)

## 2013-09-19 LAB — CBC WITH DIFFERENTIAL/PLATELET
BASOS ABS: 0.1 10*3/uL (ref 0.0–0.1)
Basophils Relative: 1 % (ref 0–1)
Eosinophils Absolute: 0.3 10*3/uL (ref 0.0–0.7)
Eosinophils Relative: 3 % (ref 0–5)
HCT: 31 % — ABNORMAL LOW (ref 36.0–46.0)
Hemoglobin: 10.4 g/dL — ABNORMAL LOW (ref 12.0–15.0)
LYMPHS ABS: 3.9 10*3/uL (ref 0.7–4.0)
LYMPHS PCT: 37 % (ref 12–46)
MCH: 29.8 pg (ref 26.0–34.0)
MCHC: 33.5 g/dL (ref 30.0–36.0)
MCV: 88.8 fL (ref 78.0–100.0)
MONO ABS: 0.9 10*3/uL (ref 0.1–1.0)
Monocytes Relative: 8 % (ref 3–12)
NEUTROS ABS: 5.5 10*3/uL (ref 1.7–7.7)
Neutrophils Relative %: 51 % (ref 43–77)
Platelets: 379 10*3/uL (ref 150–400)
RBC: 3.49 MIL/uL — AB (ref 3.87–5.11)
RDW: 12.6 % (ref 11.5–15.5)
WBC: 10.5 10*3/uL (ref 4.0–10.5)

## 2013-09-19 LAB — SURGICAL PCR SCREEN
MRSA, PCR: NEGATIVE
Staphylococcus aureus: NEGATIVE

## 2013-09-19 LAB — HEMOGLOBIN A1C
Hgb A1c MFr Bld: 10.4 % — ABNORMAL HIGH (ref <5.7)
Mean Plasma Glucose: 252 mg/dL — ABNORMAL HIGH (ref <117)

## 2013-09-19 SURGERY — FIXATION, FEMUR, NECK, PERCUTANEOUS, USING SCREW
Anesthesia: General | Laterality: Right

## 2013-09-19 MED ORDER — 0.9 % SODIUM CHLORIDE (POUR BTL) OPTIME
TOPICAL | Status: DC | PRN
  Administered 2013-09-19: 1000 mL

## 2013-09-19 MED ORDER — CEFAZOLIN SODIUM-DEXTROSE 2-3 GM-% IV SOLR
2.0000 g | Freq: Four times a day (QID) | INTRAVENOUS | Status: AC
  Administered 2013-09-19 – 2013-09-20 (×2): 2 g via INTRAVENOUS
  Filled 2013-09-19 (×2): qty 50

## 2013-09-19 MED ORDER — GLYCOPYRROLATE 0.2 MG/ML IJ SOLN
INTRAMUSCULAR | Status: AC
  Filled 2013-09-19: qty 2

## 2013-09-19 MED ORDER — DEXTROSE 5 % IV SOLN
1.0000 g | INTRAVENOUS | Status: AC
  Administered 2013-09-19 – 2013-09-21 (×3): 1 g via INTRAVENOUS
  Filled 2013-09-19 (×3): qty 10

## 2013-09-19 MED ORDER — PREDNISOLONE 5 MG PO TABS: 10.0000 mg | ORAL_TABLET | Freq: Every day | ORAL | Status: DC

## 2013-09-19 MED ORDER — METOPROLOL TARTRATE 1 MG/ML IV SOLN: 5.0000 mg | INTRAVENOUS | Status: DC | PRN

## 2013-09-19 MED ORDER — PHENOL 1.4 % MT LIQD: 1.0000 | OROMUCOSAL | Status: DC | PRN

## 2013-09-19 MED ORDER — ROCURONIUM BROMIDE 100 MG/10ML IV SOLN
INTRAVENOUS | Status: DC | PRN
  Administered 2013-09-19: 35 mg via INTRAVENOUS

## 2013-09-19 MED ORDER — ACETAMINOPHEN 325 MG PO TABS: 650.0000 mg | ORAL_TABLET | Freq: Four times a day (QID) | ORAL | Status: DC | PRN

## 2013-09-19 MED ORDER — NEOSTIGMINE METHYLSULFATE 10 MG/10ML IV SOLN
INTRAVENOUS | Status: AC
  Filled 2013-09-19: qty 1

## 2013-09-19 MED ORDER — ONDANSETRON HCL 4 MG/2ML IJ SOLN
INTRAMUSCULAR | Status: DC | PRN
  Administered 2013-09-19: 4 mg via INTRAVENOUS

## 2013-09-19 MED ORDER — GLUCOSE 40 % PO GEL
ORAL | Status: AC
  Filled 2013-09-19: qty 1

## 2013-09-19 MED ORDER — ACETAMINOPHEN 650 MG RE SUPP: 650.0000 mg | Freq: Four times a day (QID) | RECTAL | Status: DC | PRN

## 2013-09-19 MED ORDER — HYDROMORPHONE HCL PF 1 MG/ML IJ SOLN
INTRAMUSCULAR | Status: AC
  Filled 2013-09-19: qty 1

## 2013-09-19 MED ORDER — FENTANYL CITRATE 0.05 MG/ML IJ SOLN
INTRAMUSCULAR | Status: DC | PRN
  Administered 2013-09-19: 75 ug via INTRAVENOUS
  Administered 2013-09-19: 100 ug via INTRAVENOUS
  Administered 2013-09-19: 75 ug via INTRAVENOUS

## 2013-09-19 MED ORDER — PREDNISONE 10 MG PO TABS
10.0000 mg | ORAL_TABLET | Freq: Every day | ORAL | Status: DC
  Administered 2013-09-19 – 2013-09-20 (×2): 10 mg via ORAL
  Filled 2013-09-19 (×3): qty 1

## 2013-09-19 MED ORDER — POLYVINYL ALCOHOL 1.4 % OP SOLN
2.0000 [drp] | OPHTHALMIC | Status: DC | PRN
  Administered 2013-09-24 – 2013-09-29 (×4): 2 [drp] via OPHTHALMIC
  Filled 2013-09-19 (×2): qty 15

## 2013-09-19 MED ORDER — PROPOFOL 10 MG/ML IV BOLUS
INTRAVENOUS | Status: DC | PRN
  Administered 2013-09-19: 120 mg via INTRAVENOUS

## 2013-09-19 MED ORDER — CEFAZOLIN SODIUM-DEXTROSE 2-3 GM-% IV SOLR
2.0000 g | INTRAVENOUS | Status: AC
  Administered 2013-09-19: 2 g via INTRAVENOUS
  Filled 2013-09-19: qty 50

## 2013-09-19 MED ORDER — GLUCOSE-VITAMIN C 4-6 GM-MG PO CHEW
CHEWABLE_TABLET | ORAL | Status: AC
  Filled 2013-09-19: qty 1

## 2013-09-19 MED ORDER — HYDROMORPHONE HCL PF 1 MG/ML IJ SOLN
0.2500 mg | INTRAMUSCULAR | Status: DC | PRN
  Administered 2013-09-19 (×4): 0.5 mg via INTRAVENOUS

## 2013-09-19 MED ORDER — NEOSTIGMINE METHYLSULFATE 10 MG/10ML IV SOLN
INTRAVENOUS | Status: DC | PRN
  Administered 2013-09-19: 3 mg via INTRAVENOUS

## 2013-09-19 MED ORDER — LIDOCAINE HCL (CARDIAC) 20 MG/ML IV SOLN
INTRAVENOUS | Status: AC
  Filled 2013-09-19: qty 5

## 2013-09-19 MED ORDER — OXYCODONE HCL 5 MG PO TABS
5.0000 mg | ORAL_TABLET | Freq: Once | ORAL | Status: AC | PRN
  Administered 2013-09-19: 5 mg via ORAL

## 2013-09-19 MED ORDER — ONDANSETRON HCL 4 MG/2ML IJ SOLN
INTRAMUSCULAR | Status: AC
  Filled 2013-09-19: qty 2

## 2013-09-19 MED ORDER — INSULIN GLARGINE 100 UNIT/ML ~~LOC~~ SOLN
25.0000 [IU] | Freq: Two times a day (BID) | SUBCUTANEOUS | Status: DC
  Administered 2013-09-19 – 2013-09-23 (×7): 25 [IU] via SUBCUTANEOUS
  Filled 2013-09-19 (×10): qty 0.25

## 2013-09-19 MED ORDER — LACTATED RINGERS IV SOLN
INTRAVENOUS | Status: DC | PRN
  Administered 2013-09-19: 17:00:00 via INTRAVENOUS

## 2013-09-19 MED ORDER — DEXTROSE 5 % IV SOLN
INTRAVENOUS | Status: AC
  Administered 2013-09-19: 22:00:00 via INTRAVENOUS

## 2013-09-19 MED ORDER — METOCLOPRAMIDE HCL 5 MG/ML IJ SOLN: 10.0000 mg | Freq: Once | INTRAMUSCULAR | Status: DC | PRN

## 2013-09-19 MED ORDER — LIDOCAINE HCL (CARDIAC) 20 MG/ML IV SOLN
INTRAVENOUS | Status: DC | PRN
  Administered 2013-09-19: 40 mg via INTRAVENOUS

## 2013-09-19 MED ORDER — OXYCODONE HCL 5 MG PO TABS
ORAL_TABLET | ORAL | Status: AC
  Filled 2013-09-19: qty 1

## 2013-09-19 MED ORDER — MENTHOL 3 MG MT LOZG: 1.0000 | LOZENGE | OROMUCOSAL | Status: DC | PRN

## 2013-09-19 MED ORDER — MIDAZOLAM HCL 2 MG/2ML IJ SOLN
INTRAMUSCULAR | Status: AC
  Filled 2013-09-19: qty 2

## 2013-09-19 MED ORDER — PROPOFOL 10 MG/ML IV BOLUS
INTRAVENOUS | Status: AC
  Filled 2013-09-19: qty 20

## 2013-09-19 MED ORDER — DEXTROSE 50 % IV SOLN
INTRAVENOUS | Status: AC
Start: 2013-09-19 — End: 2013-09-19
  Administered 2013-09-19: 25 mL via INTRAVENOUS
  Filled 2013-09-19: qty 50

## 2013-09-19 MED ORDER — OXYCODONE HCL 5 MG/5ML PO SOLN: 5.0000 mg | Freq: Once | ORAL | Status: AC | PRN

## 2013-09-19 MED ORDER — DEXTROSE 50 % IV SOLN
INTRAVENOUS | Status: AC
  Administered 2013-09-19: 25 mL
  Filled 2013-09-19: qty 50

## 2013-09-19 MED ORDER — CHLORHEXIDINE GLUCONATE 4 % EX LIQD
60.0000 mL | Freq: Once | CUTANEOUS | Status: AC
  Administered 2013-09-19: 4 via TOPICAL
  Filled 2013-09-19: qty 60

## 2013-09-19 MED ORDER — FENTANYL CITRATE 0.05 MG/ML IJ SOLN
INTRAMUSCULAR | Status: AC
  Filled 2013-09-19: qty 5

## 2013-09-19 MED ORDER — ROCURONIUM BROMIDE 50 MG/5ML IV SOLN
INTRAVENOUS | Status: AC
  Filled 2013-09-19: qty 1

## 2013-09-19 MED ORDER — DEXTROSE 5 % IV SOLN
INTRAVENOUS | Status: DC
  Administered 2013-09-19: 08:00:00 via INTRAVENOUS

## 2013-09-19 MED ORDER — DEXTROSE 50 % IV SOLN
25.0000 mL | Freq: Once | INTRAVENOUS | Status: AC | PRN
  Administered 2013-09-19: 25 mL via INTRAVENOUS

## 2013-09-19 MED ORDER — GLYCOPYRROLATE 0.2 MG/ML IJ SOLN
INTRAMUSCULAR | Status: DC | PRN
  Administered 2013-09-19: 0.4 mg via INTRAVENOUS

## 2013-09-19 MED ORDER — FENTANYL CITRATE 0.05 MG/ML IJ SOLN
INTRAMUSCULAR | Status: AC
  Administered 2013-09-19: 50 ug
  Filled 2013-09-19: qty 2

## 2013-09-19 SURGICAL SUPPLY — 41 items
BIT DRILL 4.9 CANNULATED (BIT) ×1
BIT DRILL CANN QC 4.9 LRG (BIT) IMPLANT
BNDG COHESIVE 6X5 TAN STRL LF (GAUZE/BANDAGES/DRESSINGS) ×2 IMPLANT
COVER PERINEAL POST (MISCELLANEOUS) ×3 IMPLANT
COVER SURGICAL LIGHT HANDLE (MISCELLANEOUS) ×3 IMPLANT
DRAPE IMP U-DRAPE 54X76 (DRAPES) ×1 IMPLANT
DRAPE STERI IOBAN 125X83 (DRAPES) ×3 IMPLANT
DRILL BIT CANNULATED 4.9 (BIT) ×3
DRSG EMULSION OIL 3X3 NADH (GAUZE/BANDAGES/DRESSINGS) ×3 IMPLANT
DRSG MEPILEX BORDER 4X4 (GAUZE/BANDAGES/DRESSINGS) ×2 IMPLANT
DRSG TEGADERM 4X4.75 (GAUZE/BANDAGES/DRESSINGS) ×3 IMPLANT
DURAPREP 26ML APPLICATOR (WOUND CARE) ×3 IMPLANT
ELECT REM PT RETURN 9FT ADLT (ELECTROSURGICAL) ×3
ELECTRODE REM PT RTRN 9FT ADLT (ELECTROSURGICAL) ×1 IMPLANT
GLOVE BIO SURGEON STRL SZ7.5 (GLOVE) ×6 IMPLANT
GLOVE BIOGEL PI IND STRL 8 (GLOVE) ×1 IMPLANT
GLOVE BIOGEL PI INDICATOR 8 (GLOVE) ×2
GOWN STRL REUS W/ TWL LRG LVL3 (GOWN DISPOSABLE) ×1 IMPLANT
GOWN STRL REUS W/TWL LRG LVL3 (GOWN DISPOSABLE) ×3
GUIDEWIRE THRD ASNIS 3.2X300 (WIRE) ×6 IMPLANT
KIT BASIN OR (CUSTOM PROCEDURE TRAY) ×3 IMPLANT
KIT ROOM TURNOVER OR (KITS) ×3 IMPLANT
LINER BOOT UNIVERSAL DISP (MISCELLANEOUS) ×1 IMPLANT
MANIFOLD NEPTUNE II (INSTRUMENTS) ×3 IMPLANT
NS IRRIG 1000ML POUR BTL (IV SOLUTION) ×3 IMPLANT
PACK GENERAL/GYN (CUSTOM PROCEDURE TRAY) ×3 IMPLANT
PAD ARMBOARD 7.5X6 YLW CONV (MISCELLANEOUS) ×6 IMPLANT
PADDING CAST ABS 4INX4YD NS (CAST SUPPLIES) ×2
PADDING CAST ABS COTTON 4X4 ST (CAST SUPPLIES) IMPLANT
SCREW ASNIS 85MM (Screw) ×2 IMPLANT
SCREW ASNIS 90MM (Screw) ×2 IMPLANT
SCREW CANN 6.5X80 STRL (Screw) ×2 IMPLANT
SPONGE GAUZE 4X4 12PLY (GAUZE/BANDAGES/DRESSINGS) ×3 IMPLANT
STAPLER VISISTAT 35W (STAPLE) ×1 IMPLANT
SUT MON AB 2-0 CT1 36 (SUTURE) ×3 IMPLANT
SUT VIC AB 0 CT1 27 (SUTURE) ×3
SUT VIC AB 0 CT1 27XBRD ANBCTR (SUTURE) ×1 IMPLANT
TOWEL OR 17X24 6PK STRL BLUE (TOWEL DISPOSABLE) ×3 IMPLANT
TOWEL OR 17X26 10 PK STRL BLUE (TOWEL DISPOSABLE) ×3 IMPLANT
TOWEL OR NON WOVEN STRL DISP B (DISPOSABLE) ×3 IMPLANT
WATER STERILE IRR 1000ML POUR (IV SOLUTION) ×1 IMPLANT

## 2013-09-19 NOTE — Transfer of Care (Signed)
Immediate Anesthesia Transfer of Care Note  Patient: [name]  Procedure(s) Performed: Procedure(s) with comments: CANNULATED HIP PINNING (Right) -  Fracture table, c-arm, [employer]- Mr. Eulah PontMurphy to call rep  Patient Location: PACU  Anesthesia Type:General  Level of Consciousness: awake, alert  and oriented  Airway & Oxygen Therapy: Patient Spontanous Breathing and Patient connected to nasal cannula oxygen  Post-op Assessment: Report given to PACU RN, Post -op Vital signs reviewed and stable and Patient moving all extremities X 4  Post vital signs: Reviewed and stable  Complications: No apparent anesthesia complications

## 2013-09-19 NOTE — Progress Notes (Signed)
Utilization review completed.  

## 2013-09-19 NOTE — Consult Note (Signed)
CONSULT NOTE  Date: 09/19/2013               Patient Name:  [name] MRN: [id number]  DOB: 28-Oct-1950 Age / Sex: 63 y.o., female        PCP: Fredirick Maudlin Primary Cardiologist: Hilty            Referring Physician: Burney Gauze              Reason for Consult: Pre op evaluation prior to hip surgery            History of Present Illness: Patient is a 63 y.o. female with a PMHx of coronary artery disease-status post CABG, who was admitted to [facility] on 09/18/2013 for evaluation of broken right hip.  The patient has a history of coronary artery disease dating back to 70. She had coronary artery bypass grafting at that time. She had a myocardial infarction in November, 2014 requiring placement of a drug-eluting stent. She's been on Plavix and aspirin since that time. She also has a history of insulin-dependent diabetes mellitus, COPD, CHF. She is deaf.  She's been unsteady on her feet and is post use a walker or a cane to walk around. She does not always use her walker or cane.  So the history was obtained from her partner who is in the room during the interview.  She has occasional episodes of chest discomfort. She took some nitroglycerin several weeks ago. It's difficult for her to tell which is having angina versus musculoskeletal pain. Chest poor control diabetes.  At present she's not having any angina and is not in heart failure.  He's complains of having lots of right leg pain.   Medications: Outpatient medications: Prescriptions prior to admission  Medication Sig Dispense Refill  . ALPRAZolam (XANAX) 1 MG tablet Take 1 mg by mouth 4 (four) times daily as needed for sleep or anxiety.      Marland Kitchen amitriptyline (ELAVIL) 100 MG tablet Take 100 mg by mouth at bedtime.      Marland Kitchen amLODipine (NORVASC) 2.5 MG tablet Take 2.5 mg by mouth daily.      Marland Kitchen aspirin EC 81 MG tablet Take 162 mg by mouth 2 (two) times daily.      . Calcium Carb-Cholecalciferol 600-500 MG-UNIT CAPS Take 1  capsule by mouth daily.      . carvedilol (COREG) 25 MG tablet Take 25 mg by mouth 2 (two) times daily with a meal.      . clopidogrel (PLAVIX) 75 MG tablet Take 1 tablet (75 mg total) by mouth daily.  30 tablet  11  . COD LIVER OIL PO Take 1 capsule by mouth daily.       . Cyanocobalamin (VITAMIN B-12 PO) Take 1 tablet by mouth daily.      . diclofenac sodium (VOLTAREN) 1 % GEL Apply 4 g topically daily as needed (muscle pain).       . fentaNYL (DURAGESIC - DOSED MCG/HR) 25 MCG/HR Place 1 patch onto the skin every 3 (three) days.      . folic acid (FOLVITE) 800 MCG tablet Take 1,600 mcg by mouth daily.      . furosemide (LASIX) 40 MG tablet Take 40-60 mg by mouth See admin instructions. Take 40 mg every  morning.  Take 20 mg every other evening.      . insulin glargine (LANTUS) 100 UNIT/ML injection Inject 25 Units into the skin 2 (two) times daily.       Marland Kitchen  insulin lispro (HUMALOG) 100 UNIT/ML injection Inject 5-12 Units into the skin 3 (three) times daily before meals. 5-12 units per sliding scale      . iron polysaccharides (NU-IRON) 150 MG capsule Take 1 capsule (150 mg total) by mouth daily.      . isosorbide mononitrate (IMDUR) 60 MG 24 hr tablet Take 1 tablet (60 mg total) by mouth 2 (two) times daily.  60 tablet  10  . Lutein 20 MG CAPS Take 1 capsule by mouth daily.      . nitroGLYCERIN (NITROSTAT) 0.4 MG SL tablet Place 0.4 mg under the tongue every 5 (five) minutes as needed for chest pain.      Marland Kitchen omeprazole (PRILOSEC) 20 MG capsule Take 20 mg by mouth daily.      Marland Kitchen oxyCODONE-acetaminophen (PERCOCET) 10-325 MG per tablet Take 1 tablet by mouth every 4 (four) hours as needed for pain.      . potassium chloride SA (K-DUR,KLOR-CON) 20 MEQ tablet Take 30 mEq by mouth daily.      . predniSONE (DELTASONE) 10 MG tablet Take 10 mg by mouth daily.      . Pyridoxine HCl (VITAMIN B-6 PO) Take 1 tablet by mouth daily.      . sertraline (ZOLOFT) 50 MG tablet Take 50 mg by mouth daily.      .  timolol (TIMOPTIC) 0.25 % ophthalmic solution Place 1 drop into both eyes daily.       . TRAVATAN Z 0.004 % SOLN ophthalmic solution Place 1 drop into both eyes at bedtime.      . triamcinolone cream (KENALOG) 0.1 % Apply 1 application topically daily. Apply to rash      . valsartan (DIOVAN) 40 MG tablet Take 40 mg by mouth daily.      . vitamin E 400 UNIT capsule Take 400 Units by mouth daily.      Marland Kitchen zinc gluconate 50 MG tablet Take 50 mg by mouth daily.      Marland Kitchen ZINC GLUCONATE PO Take by mouth.        Current medications: Current Facility-Administered Medications  Medication Dose Route Frequency Provider Last Rate Last Dose  . ALPRAZolam Prudy Feeler) tablet 1 mg  1 mg Oral QID PRN Doree Albee, MD   1 mg at 09/19/13 0047  . amitriptyline (ELAVIL) tablet 100 mg  100 mg Oral QHS Doree Albee, MD      . amLODipine (NORVASC) tablet 2.5 mg  2.5 mg Oral Daily Doree Albee, MD      . aspirin EC tablet 162 mg  162 mg Oral BID Doree Albee, MD   162 mg at 09/18/13 2233  . calcium-vitamin D (OSCAL WITH D) 500-200 MG-UNIT per tablet 1 tablet  1 tablet Oral Daily Doree Albee, MD      . carvedilol (COREG) tablet 25 mg  25 mg Oral BID WC Doree Albee, MD   25 mg at 09/19/13 0801  . ceFAZolin (ANCEF) IVPB 2 g/50 mL premix  2 g Intravenous On Call to OR Sheral Apley, MD      . chlorhexidine (HIBICLENS) 4 % liquid 4 application  60 mL Topical Once Sheral Apley, MD      . clopidogrel (PLAVIX) tablet 75 mg  75 mg Oral Daily Doree Albee, MD      . dextrose 5 % solution   Intravenous Continuous Leroy Sea, MD 75 mL/hr at 09/19/13 0730    . fentaNYL (DURAGESIC - dosed mcg/hr) patch 25 mcg  25 mcg Transdermal Q72H Doree Albee, MD      . folic acid (FOLVITE) tablet 0.5 mg  0.5 mg Oral Daily Doree Albee, MD      . furosemide (LASIX) tablet 20 mg  20 mg Oral Q48H Doree Albee, MD      . furosemide (LASIX) tablet 40 mg  40 mg Oral Daily Doree Albee, MD   40 mg at 09/18/13 2227  . heparin  injection 5,000 Units  5,000 Units Subcutaneous 3 times per day Doree Albee, MD   5,000 Units at 09/18/13 2228  . HYDROmorphone (DILAUDID) injection 0.5-1 mg  0.5-1 mg Intravenous Q2H PRN Doree Albee, MD   1 mg at 09/19/13 0640  . insulin aspart (novoLOG) injection 0-9 Units  0-9 Units Subcutaneous 6 times per day Doree Albee, MD   7 Units at 09/19/13 0041  . insulin glargine (LANTUS) injection 25 Units  25 Units Subcutaneous BID Leroy Sea, MD      . irbesartan (AVAPRO) tablet 37.5 mg  37.5 mg Oral Daily Doree Albee, MD      . iron polysaccharides (NIFEREX) capsule 150 mg  150 mg Oral Daily Doree Albee, MD      . isosorbide mononitrate (IMDUR) 24 hr tablet 60 mg  60 mg Oral BID Doree Albee, MD   60 mg at 09/18/13 2227  . latanoprost (XALATAN) 0.005 % ophthalmic solution 1 drop  1 drop Both Eyes QHS Doree Albee, MD   1 drop at 09/18/13 2228  . nitroGLYCERIN (NITROSTAT) SL tablet 0.4 mg  0.4 mg Sublingual Q5 min PRN Doree Albee, MD      . pantoprazole (PROTONIX) EC tablet 40 mg  40 mg Oral QAC breakfast Doree Albee, MD      . potassium chloride (K-DUR,KLOR-CON) CR tablet 30 mEq  30 mEq Oral Daily Doree Albee, MD      . predniSONE (DELTASONE) tablet 10 mg  10 mg Oral Daily Doree Albee, MD      . sertraline (ZOLOFT) tablet 50 mg  50 mg Oral Daily Doree Albee, MD      . sodium chloride 0.9 % injection 3 mL  3 mL Intravenous Q12H Doree Albee, MD      . timolol (TIMOPTIC) 0.25 % ophthalmic solution 1 drop  1 drop Both Eyes Daily Doree Albee, MD         Allergies  Allergen Reactions  . Codeine     Unknown  . Flagyl [Metronidazole]     Unknown  . Fluocinonide     Unknown  . Ranexa [Ranolazine] Other (See Comments)    Elevated liver function  . Statins Other (See Comments)    Muscle aches  . Biaxin [Clarithromycin] Rash  . Demerol [Meperidine] Rash     Past Medical History  Diagnosis Date  . Respiratory arrest   . COPD (chronic obstructive pulmonary  disease)   . CHF (congestive heart failure)   . Diabetes mellitus     insulin-dep  . Fibromyalgia   . Deaf     bilateral neurosensory hearing loss - Dr. Narda Bonds  . Myocardial infarction   . Stroke   . GERD (gastroesophageal reflux disease)   . Depression   . Hypertension   . Pneumonia   . Coronary artery disease   . Polymyalgia rheumatica   . Unstable angina   . PAD (peripheral artery disease)     LEAs 05/10/2012 - L SFA 50-69% diameter reduction, bilat ICA with 0-49% diameter reduction  . Tobacco  abuse   . History of nuclear stress test 11/2011    lexiscan; low risk, non-diagnostic for ischemia    Past Surgical History  Procedure Laterality Date  . Coronary artery bypass graft  04/03/1993    x5 LIMA to LAD; SVG to DX1; SVG to RCA, SVG to OM;  (Dr. Tyrone SageGerhardt)  . Eye surgery    . Coronary angioplasty  1995    RCA PTCA (07/23/1993); mid-LAD PTCA (08/21/1993); PTCA for restenosis RCA (09/13/1993),   . Coronary angioplasty  1998    Cfx & LAD PTCA  . Coronary angioplasty  1999    Cfx & LAD PTCA  . Coronary angioplasty  09/17/2004    cutting balloon small vessel mid LAD beyond LIMA insertion (2.5x8113mm Cypher to mid SVG to RCA  . Coronary angioplasty  05/21/2010    patent LIMA-LAD, diffuse disease distally, patent SVG-small diagonal, patent SVG-right w/widely patent stent, patent SVG-OM1 (Dr. Kirtland BouchardK. [place] Hilty)  . Transthoracic echocardiogram  12/2010    EF=>55%, mild conc LVH; IVC non-dilated w/>50% collapse; mild mitral annular calcif, calcified mitral apparatus, mild MD; mild TR & elevated RVSP; mild-mod AV regurg  . Coronary angioplasty  07/18/2008    patent SVG to acute marginal & PDA, SVG to OM1, SVG to diagonal 1, & LIMA to LAD (Dr. Evlyn CourierJ. Gangi(  . Coronary angioplasty  04/20/2007    patent SVG-diagonal, SVG-obtuse marginal, SVG-RCA, LIMA-LAD, patent stent to SVG in RCA (Dr. Claudia DesanctisH. Solomon)  . Cardiac catheterization  04/10/2007    3x3423mm Promus DES to SVG to RCA  . Coronary angioplasty   03/22/2006    patent LIMA to LAD, patent SVG to small diagonal, patent SVG to OM, patent vein graft to RV acute marginal & RCA (Dr. Laurell Josephs. McQueen)  . Coronary angioplasty  12/03/2002    ballon angioplasty to lesion in prox LAD, prox Cfx & stenting, angioplasty to L AV groove artery (Dr. Jonette Eva. Weintraub)  . Coronary angioplasty  09/08/2000    Dr. Jonette Eva. Weintraub    Family History  Problem Relation Age of Onset  . Hypertension Father     Social History:  reports that she quit smoking about 5 years ago. She does not have any smokeless tobacco history on file. She reports that she does not drink alcohol or use illicit drugs.   Review of Systems: Constitutional:  denies fever, chills, diaphoresis, appetite change and fatigue.  HEENT: denies photophobia, eye pain, redness,ear pain, congestion, sore throat, rhinorrhea.  She is almost completely deaf.   Respiratory: denies SOB, DOE, cough, chest tightness, and wheezing.  Cardiovascular: admits to  Occasional chest pain, palpitations and leg swelling.  Gastrointestinal: denies nausea, vomiting, abdominal pain, diarrhea, constipation, blood in stool.  Genitourinary: denies dysuria, urgency, frequency, hematuria, flank pain and difficulty urinating.  Musculoskeletal: denies  myalgias, back pain, joint swelling, arthralgias and gait problem.   Skin: admits to  Ulcerations on her legs that are healing   Neurological: admits to unsteadiness on her feet when she is walking.   Hematological: denies adenopathy, easy bruising, personal or family bleeding history.  Psychiatric/ Behavioral: denies suicidal ideation, mood changes, confusion, nervousness, sleep disturbance and agitation.    Physical Exam: BP 124/45  Pulse 76  Temp(Src) 98 F (36.7 C) (Oral)  Resp 16  Ht 5\' 4"  (1.626 m)  Wt 165 lb 3.2 oz (74.934 kg)  BMI 28.34 kg/m2  SpO2 96%  Wt Readings from Last 3 Encounters:  09/19/13 165 lb 3.2 oz (74.934 kg)  09/19/13 165 lb  3.2 oz (74.934 kg)    06/18/13 160 lb 6.4 oz (72.757 kg)    General: Vital signs reviewed and noted. Well-developed, well-nourished, in no acute distress; alert,   Head: Normocephalic, atraumatic, sclera anicteric,   Neck: Supple. Negative for carotid bruits. No JVD   Lungs:  She has a few wheezes - especially on the right side   Heart: RRR with S1 S2. No murmurs, rubs, or gallops   Abdomen:  Soft, non-tender, non-distended with normoactive bowel sounds. No hepatomegaly. No rebound/guarding. No obvious abdominal masses   MSK: Strength and the appear normal for age.   Extremities: No clubbing or cyanosis. Trace  edema.  Distal pedal pulses are 2+ and equal .  She has some skin sores on her legs.   Neurologic: Alert and oriented X 3. Moves all extremities spontaneously.  Psych: Responds to questions appropriately with a normal affect.     Lab results: Basic Metabolic Panel:  Recent Labs Lab 09/18/13 1825 09/18/13 2221 09/19/13 0349  NA 134*  --  141  K 4.4  --  3.7  CL 91*  --  101  CO2 29  --  25  GLUCOSE 431*  --  48*  BUN 20  --  14  CREATININE 0.94 0.86 0.80  CALCIUM 9.1  --  8.8    Liver Function Tests:  Recent Labs Lab 09/18/13 1825 09/19/13 0349  AST 17 17  ALT 18 14  ALKPHOS 96 84  BILITOT <0.2* <0.2*  PROT 7.6 6.7  ALBUMIN 3.5 3.1*   No results found for this basename: LIPASE, AMYLASE,  in the last 168 hours No results found for this basename: AMMONIA,  in the last 168 hours  CBC:  Recent Labs Lab 09/18/13 1825 09/18/13 2221 09/19/13 0349  WBC 10.0 10.1 10.5  NEUTROABS 6.9  --  5.5  HGB 11.3* 10.7* 10.4*  HCT 33.7* 31.9* 31.0*  MCV 89.9 88.9 88.8  PLT 364 370 379    Cardiac Enzymes: No results found for this basename: CKTOTAL, CKMB, CKMBINDEX, TROPONINI,  in the last 168 hours  BNP: No components found with this basename: POCBNP,   CBG:  Recent Labs Lab 09/18/13 2244 09/19/13 0002 09/19/13 0145 09/19/13 0359 09/19/13 0433  GLUCAP 352* 309* 165* 45*  167*    Coagulation Studies:  Recent Labs  09/18/13 1825  LABPROT 12.6  INR 0.94     Other results: EKG :  Lots are artifact.  NSR.  She has nonspecific ST abnormalities in the lateral leads   Imaging: Dg Hip Complete Right  09/18/2013   CLINICAL DATA:  Right hip pain secondary to a fall.  EXAM: RIGHT HIP - COMPLETE 2+ VIEW  COMPARISON:  None.  FINDINGS: There is a slightly impacted subcapital fracture or right femoral neck. No displacement.  Pelvic bones are intact.  IMPRESSION: Impacted subcapital fracture of the right femoral neck.   Electronically Signed   By: Geanie Cooley M.D.   On: 09/18/2013 18:59   Dg Chest Port 1 View  09/18/2013   CLINICAL DATA:  Preop hip fracture.  Diabetes.  Hypertension.  EXAM: PORTABLE CHEST - 1 VIEW  COMPARISON:  01/07/2013  FINDINGS: Postoperative changes in the mediastinum. Normal heart size and pulmonary vascularity. Interstitial changes in the lungs probably representing fibrosis, similar to previous study no focal airspace disease. No blunting of costophrenic angles. No pneumothorax.  IMPRESSION: No active disease.   Electronically Signed   By: Burman Nieves M.D.   On: 09/18/2013  21:04          Assessment & Plan:  1. coronary artery disease: The patient has a long history of coronary artery disease. She had a myocardial infarction in November, 2014 treated with a stent to the saphenous vein graft to her diagonal.  She's had some occasional episodes of chest pain it is unclear whether these were anginal or not. She does not get any regular exercise.  At this point she would be considered at least at moderate to go for urgent orthopedic surgery. I would recommend continuing Plavix and ASA  at current dose.  She's not in congestive heart therapeutics is not having any angina today.  Although her risk for hip surgery are somewhat high, I do not see any alternative at this point.   She is in considerable pain and needs to have her hip fracture   repaired.   2. COPD :  She has some wheezing on exam today  3. Chronic systolic CHF:  Her last assessment of LV function was during her cardiac catheterization on 01/02/2013. She has moderate LV dysfunction with an ejection fraction of 35-40%.  4. Diabetes mellitus:  Plans per int. Med.     Vesta Mixer, Montez Hageman., MD, [facility] 09/19/2013, 9:12 AM Office - [phone] Pager [phone]

## 2013-09-19 NOTE — Progress Notes (Addendum)
Patient Demographics  [name], is a 63 y.o. female, DOB - 1950-12-24, ZOX:[id number]  Admit date - 09/18/2013   Admitting Physician Doree Albee, MD  Outpatient Primary MD for the patient is HAWKINS,EDWARD Elbert Ewings, MD  LOS - 1   Chief Complaint  Patient presents with  . Fall  . Hip Pain           Subjective:   [name] today has, No headache, No chest pain, No abdominal pain - No Nausea, No new weakness tingling or numbness, No Cough - SOB. Constant right-sided hip pain.    Assessment & Plan    1. Mechanical fall with right-sided subcapital femoral fracture - for now supportive care with pain control, orthopedics Dr. Eulah Pont seen the patient likely to proceed for open reduction internal fixation later on 09/19/2013.   Cardio-Pulm Risk stratification for surgery and recommendations to minimize the same:-  A.Cardio-Pulmonary Risk -  this patient is a high for adverse Cardio-Pulmonary  Outcome  from surgery, the risks and benefits were discussed and acceptable to agent and significant other bedside, Dr. Eulah Pont present as well. Cardiology to evaluate.  Recommendations for optimizing Cardio-Pulmonary  Risk risk factors  1. Keep SBP<140, HR<85, use Lopressor 5mg  IV q4hrs PRN, or B.Blocker drip PRN. 2. Moniotr I&Os. 3. Minimal sedation and Narcotics. 4. Good pulmunary toilet. 5. PRN Nebs and as needed oxygen to keep Pox>90% 6. Hb>8, transfuse as needed- Lasix 10mg  IV after each unit PRBC Transfused.   B.Bleeding Risk - no previous surgical complications, no easy bruising,  Antiplate meds aspirin and Plavix   Lab Results  Component Value Date   PLT 379 09/19/2013                  Lab Results  Component Value Date   INR 0.94 09/18/2013   INR 0.99 01/08/2013   INR 1.06 10/10/2012       Will request Surgeon to please Order Lovenox/DVT prophylaxis of his/her choice when OK from Surgeon's standpoint post op.     2.Diabetes mellitus type 1, uncontrolled, insulin dependent - this morning CBGs were slightly low, hold long-acting insulin, D5 W. drip, every 2 hours CBGs and monitor.   CBG (last 3)   Recent Labs  09/19/13 0145 09/19/13 0359 09/19/13 0433  GLUCAP 165* 45* 167*      3. Essential hypertension. Continue Coreg, Norvasc, hold ARB prior to surgery. As needed IV to pressor.    4.Polymyalgia rheumatica - the home dose steroid if blood pressure falls we'll increase to stress dose steroids.,    5. 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 Status post recent PCI to SVG to RCA with stent placement. CABG in 1995. Chronic systolic heart failure last known EF of 50%. Currently aspirin, Coreg, Plavix being continued, will continue him [place], home dose Lasix, as needed sublingual nitroglycerin.    EKG nonacute, she has no chest pain, she does have a soft systolic mitral valve murmur. She will be high risk for any operative adverse outcomes was explained clearly to patient and significant other in the presence of Dr. Eulah Pont orthopedic surgeon. Patient and friend accept the risk, cardiology request to evaluate as well.    6. GERD place on PPI.  7. UTI. Place on Rocephin, monitor cultures.       Code Status: Full  Family Communication: Significant other bedside  Disposition Plan: TBD   Procedures ORIF R Femur  Dr Eulah Pont likely 09-19-13   Consults Ortho, cards   Medications  Scheduled Meds: . amitriptyline  100 mg Oral QHS  . amLODipine  2.5 mg Oral Daily  . aspirin EC  162 mg Oral BID  . calcium-vitamin D  1 tablet Oral Daily  . carvedilol  25 mg Oral BID WC  .  ceFAZolin (ANCEF) IV  2 g Intravenous On Call to OR  . chlorhexidine  60 mL Topical Once  . clopidogrel  75 mg Oral Daily  . fentaNYL  25 mcg Transdermal Q72H   . folic acid  0.5 mg Oral Daily  . furosemide  20 mg Oral Q48H  . furosemide  40 mg Oral Daily  . heparin  5,000 Units Subcutaneous 3 times per day  . insulin aspart  0-9 Units Subcutaneous 6 times per day  . insulin glargine  25 Units Subcutaneous BID  . irbesartan  37.5 mg Oral Daily  . iron polysaccharides  150 mg Oral Daily  . isosorbide mononitrate  60 mg Oral BID  . latanoprost  1 drop Both Eyes QHS  . pantoprazole  40 mg Oral QAC breakfast  . potassium chloride SA  30 mEq Oral Daily  . predniSONE  10 mg Oral Daily  . sertraline  50 mg Oral Daily  . sodium chloride  3 mL Intravenous Q12H  . timolol  1 drop Both Eyes Daily   Continuous Infusions: . dextrose 75 mL/hr at 09/19/13 0730   PRN Meds:.ALPRAZolam, HYDROmorphone (DILAUDID) injection, nitroGLYCERIN  DVT Prophylaxis    SCDs    Lab Results  Component Value Date   PLT 379 09/19/2013    Antibiotics     Anti-infectives   Start     Dose/Rate Route Frequency Ordered Stop   09/19/13 0845  ceFAZolin (ANCEF) IVPB 2 g/50 mL premix     2 g 100 mL/hr over 30 Minutes Intravenous On call to O.R. 09/19/13 0831 09/20/13 0559          Objective:   Filed Vitals:   09/18/13 2030 09/19/13 0410 09/19/13 0500 09/19/13 0801  BP: 143/61 113/42  124/45  Pulse: 75 72  76  Temp:  98 F (36.7 C)    TempSrc:  Oral    Resp: 17 16    Height:      Weight:   74.934 kg (165 lb 3.2 oz)   SpO2: 96% 96%      Wt Readings from Last 3 Encounters:  09/19/13 74.934 kg (165 lb 3.2 oz)  09/19/13 74.934 kg (165 lb 3.2 oz)  06/18/13 72.757 kg (160 lb 6.4 oz)     Intake/Output Summary (Last 24 hours) at 09/19/13 0922 Last data filed at 09/19/13 0409  Gross per 24 hour  Intake    240 ml  Output   1400 ml  Net  -1160 ml     Physical Exam  Awake Alert, Oriented X 2, No new F.N deficits, Normal affect [place].AT,PERRAL Supple Neck,No JVD, No cervical lymphadenopathy appriciated.  Symmetrical Chest wall movement, Good air movement  bilaterally, CTAB RRR,No Gallops,Rubs , +ve soft systolic mitral valve Murmur, No Parasternal Heave +ve B.Sounds, Abd Soft, No tenderness, No organomegaly appriciated, No rebound - guarding or rigidity. No Cyanosis, Clubbing or edema, No new Rash or bruise     Data  Review   Micro Results No results found for this or any previous visit (from the past 240 hour(s)).  Radiology Reports Dg Hip Complete Right  09/18/2013   CLINICAL DATA:  Right hip pain secondary to a fall.  EXAM: RIGHT HIP - COMPLETE 2+ VIEW  COMPARISON:  None.  FINDINGS: There is a slightly impacted subcapital fracture or right femoral neck. No displacement.  Pelvic bones are intact.  IMPRESSION: Impacted subcapital fracture of the right femoral neck.   Electronically Signed   By: Geanie CooleyJim  Maxwell M.D.   On: 09/18/2013 18:59   Dg Chest Port 1 View  09/18/2013   CLINICAL DATA:  Preop hip fracture.  Diabetes.  Hypertension.  EXAM: PORTABLE CHEST - 1 VIEW  COMPARISON:  01/07/2013  FINDINGS: Postoperative changes in the mediastinum. Normal heart size and pulmonary vascularity. Interstitial changes in the lungs probably representing fibrosis, similar to previous study no focal airspace disease. No blunting of costophrenic angles. No pneumothorax.  IMPRESSION: No active disease.   Electronically Signed   By: Burman NievesWilliam  Stevens M.D.   On: 09/18/2013 21:04    CBC  Recent Labs Lab 09/18/13 1825 09/18/13 2221 09/19/13 0349  WBC 10.0 10.1 10.5  HGB 11.3* 10.7* 10.4*  HCT 33.7* 31.9* 31.0*  PLT 364 370 379  MCV 89.9 88.9 88.8  MCH 30.1 29.8 29.8  MCHC 33.5 33.5 33.5  RDW 12.6 12.4 12.6  LYMPHSABS 2.1  --  3.9  MONOABS 0.7  --  0.9  EOSABS 0.2  --  0.3  BASOSABS 0.0  --  0.1    Chemistries   Recent Labs Lab 09/18/13 1825 09/18/13 2221 09/19/13 0349  NA 134*  --  141  K 4.4  --  3.7  CL 91*  --  101  CO2 29  --  25  GLUCOSE 431*  --  48*  BUN 20  --  14  CREATININE 0.94 0.86 0.80  CALCIUM 9.1  --  8.8  AST 17  --  17   ALT 18  --  14  ALKPHOS 96  --  84  BILITOT <0.2*  --  <0.2*   ------------------------------------------------------------------------------------------------------------------ estimated creatinine clearance is 73.2 ml/min (by C-G formula based on Cr of 0.8). ------------------------------------------------------------------------------------------------------------------ No results found for this basename: HGBA1C,  in the last 72 hours ------------------------------------------------------------------------------------------------------------------ No results found for this basename: CHOL, HDL, LDLCALC, TRIG, CHOLHDL, LDLDIRECT,  in the last 72 hours ------------------------------------------------------------------------------------------------------------------ No results found for this basename: TSH, T4TOTAL, FREET3, T3FREE, THYROIDAB,  in the last 72 hours ------------------------------------------------------------------------------------------------------------------ No results found for this basename: VITAMINB12, FOLATE, FERRITIN, TIBC, IRON, RETICCTPCT,  in the last 72 hours  Coagulation profile  Recent Labs Lab 09/18/13 1825  INR 0.94    No results found for this basename: DDIMER,  in the last 72 hours  Cardiac Enzymes No results found for this basename: CK, CKMB, TROPONINI, MYOGLOBIN,  in the last 168 hours ------------------------------------------------------------------------------------------------------------------ No components found with this basename: POCBNP,      Time Spent in minutes   35   Mohd. Derflinger K M.D on 09/19/2013 at 9:22 AM  Between 7am to 7pm - Pager - [phone]  After 7pm go to [email or web address] - password [email or web address]  And look for the night coverage person covering for me after hours  [facility] Office  [phone]   **Disclaimer: This note may have been dictated with voice recognition software. Similar sounding words can  inadvertently be transcribed and this note may contain transcription errors which may not  have been corrected upon publication of note.**

## 2013-09-19 NOTE — Anesthesia Procedure Notes (Signed)
Procedure Name: Intubation Date/Time: 09/19/2013 4:50 PM Performed by: Orvilla FusATO, Jenny Lai A Pre-anesthesia Checklist: Patient identified, Timeout performed, Emergency Drugs available, Suction available and Patient being monitored Patient Re-evaluated:Patient Re-evaluated prior to inductionOxygen Delivery Method: Circle system utilized Preoxygenation: Pre-oxygenation with 100% oxygen Intubation Type: IV induction Ventilation: Mask ventilation without difficulty Laryngoscope Size: Mac and 3 Grade View: Grade II Tube type: Oral Tube size: 7.0 mm Number of attempts: 1 Airway Equipment and Method: Stylet Placement Confirmation: ETT inserted through vocal cords under direct vision,  breath sounds checked- equal and bilateral and positive ETCO2 Secured at: 21 cm Tube secured with: Tape Dental Injury: Teeth and Oropharynx as per pre-operative assessment

## 2013-09-19 NOTE — Progress Notes (Signed)
Inpatient Diabetes Program Recommendations  AACE/ADA: New Consensus Statement on Inpatient Glycemic Control (2013)  Target Ranges:  Prepandial:   less than 140 mg/dL      Peak postprandial:   less than 180 mg/dL (1-2 hours)      Critically ill patients:  140 - 180 mg/dL   Reason for Assessment:  Results for [name], [name] (MRN [id number]) as of 09/19/2013 14:24  Ref. Range 09/19/2013 04:33 09/19/2013 07:31 09/19/2013 07:54 09/19/2013 10:13 09/19/2013 12:04  Glucose-Capillary Latest Range: 70-99 mg/dL 409167 (H) 66 (L) 811137 (H) 109 (H) 86   Diabetes history: Type 2 diabetes Outpatient Diabetes medications: Lantus 25 units bid, Humalog 5-12 units tid with meals Current orders for Inpatient glycemic control: Lantus 25 units bid, Novolog sensitive q 4 hours  May consider decreasing Lantus to 25 units daily (instead of bid).  Thanks, Beryl MeagerJenny Rena Hunke, RN, BC-ADM Inpatient Diabetes Coordinator Pager [phone]

## 2013-09-19 NOTE — Progress Notes (Signed)
Orthopedic Tech Progress Note Patient Details:  Enid DerryJudy A Hornig 05/03/1950 [id number]  Ortho Devices Ortho Device/Splint Location: trapeze bar patient helper Ortho Device/Splint Interventions: Application   Nikki Domrawford, Kiki Bivens 09/19/2013, 9:09 PM

## 2013-09-19 NOTE — Discharge Instructions (Signed)
Bear weight as tolerated  Continue your aspirin and plavix as perscribed

## 2013-09-19 NOTE — Progress Notes (Signed)
Hypoglycemic Event  CBG: 66  Treatment: D50 IV 25 mL  Symptoms: None  Follow-up CBG: Time:0754 CBG Result:137  Possible Reasons for Event: Inadequate meal intake  Comments/MD notified: CBG's changed to Q2H while NPO per Dr. Thedore MinsSingh, Dextrose 5% ordered at 5775ml/hr.    [name], Luiz Trumpower J  Remember to initiate Hypoglycemia Order Set & complete

## 2013-09-19 NOTE — Anesthesia Preprocedure Evaluation (Addendum)
Anesthesia Evaluation  Patient identified by MRN, date of birth, ID band Patient awake    Reviewed: Allergy & Precautions, H&P , NPO status , Patient's Chart, lab work & pertinent test results, reviewed documented beta blocker date and time   Airway Mallampati: II TM Distance: >3 FB Neck ROM: full    Dental  (+) Teeth Intact, Dental Advisory Given   Pulmonary pneumonia -, resolved, COPDformer smoker,  breath sounds clear to auscultation        Cardiovascular hypertension, Pt. on home beta blockers and Pt. on medications + angina + CAD, + Past MI, + Cardiac Stents, + CABG, + Peripheral Vascular Disease and +CHF negative cardio ROS  Rhythm:regular     Neuro/Psych PSYCHIATRIC DISORDERS Hearing deficit, dizzy spells, falls  Neuromuscular disease CVA    GI/Hepatic Neg liver ROS, GERD-  Medicated and Controlled,  Endo/Other  diabetes, Well Controlled, Type 2, Insulin Dependent  Renal/GU negative Renal ROS  negative genitourinary   Musculoskeletal  (+) Fibromyalgia -  Abdominal   Peds  Hematology negative hematology ROS (+)   Anesthesia Other Findings See surgeon's H&P   Reproductive/Obstetrics negative OB ROS                         Anesthesia Physical Anesthesia Plan  ASA: III  Anesthesia Plan: General   Post-op Pain Management:    Induction: Intravenous  Airway Management Planned: Oral ETT  Additional Equipment:   Intra-op Plan:   Post-operative Plan: Extubation in OR  Informed Consent: I have reviewed the patients History and Physical, chart, labs and discussed the procedure including the risks, benefits and alternatives for the proposed anesthesia with the patient or authorized representative who has indicated his/her understanding and acceptance.   Dental Advisory Given  Plan Discussed with: CRNA and Surgeon  Anesthesia Plan Comments:         Anesthesia Quick Evaluation

## 2013-09-19 NOTE — Op Note (Signed)
09/18/2013 - 09/19/2013  5:32 PM  PATIENT:  Erika DerryJudy A Berent    PRE-OPERATIVE DIAGNOSIS:  right hip fracture  POST-OPERATIVE DIAGNOSIS:  Same  PROCEDURE:  CANNULATED HIP PINNING  SURGEON:  Margarita RanaMURPHY, Kareen Jefferys, D, MD  ASSISTANT: Janace LittenBrandon Parry OPA  ANESTHESIA:   General  PREOPERATIVE INDICATIONS:  Erika DerryJudy A Gales is a  63 y.o. female who fell and was found to have a diagnosis of right hip fracture who elected for surgical management.    The risks benefits and alternatives were discussed with the patient preoperatively including but not limited to the risks of infection, bleeding, nerve injury, cardiopulmonary complications, blood clots, malunion, nonunion, avascular necrosis, the need for revision surgery, the potential for conversion to hemiarthroplasty, among others, and the patient was willing to proceed.  OPERATIVE IMPLANTS: 6.5 mm cannulated screws x3  OPERATIVE FINDINGS: Clinical osteoporosis with weak bone, proximal femur  OPERATIVE PROCEDURE: The patient was brought to the operating room and placed in supine position. IV antibiotics were given. General anesthesia administered. Foley was also given. The patient was placed on the fracture table. The operative extremity was positioned, without any significant reduction maneuver and was prepped and draped in usual sterile fashion.  Time out was performed.  Small incisions were made distal to the greater trochanter, and 3 guidewires were introduced Into an inverted triangle configuration. The lengths were measured. The reduction was slightly valgus, and near-anatomic. I opened the cortex with a cannulated drill, and then placed the screws into position. Satisfactory fixation was achieved. I sequentially tightened the screws by hand.  I performed a live fluoroscopic exam and no screw penetrance was noted. All threads crossed the fracture site.   The wounds were irrigated copiously, and repaired with Vicryl with Steri-Strips and sterile gauze. There no  complications and the patient tolerated the procedure well.  The patient will be weightbearing as tolerated, VTE prophylaxis will be: ASA and cont plavix

## 2013-09-19 NOTE — Progress Notes (Signed)
Hypoglycemic Event  CBG: 45  Treatment: D50 IV 25 mL @0421   Symptoms: Sweaty and Shaky  Follow-up CBG: Time: 0433 CBG Result: 167  Possible Reasons for Event: Medication regimen: insulin correction   Comments/MD notified:     Jacqualyn PoseyGoss, Nandan Willems A  Remember to initiate Hypoglycemia Order Set & complete

## 2013-09-20 ENCOUNTER — Encounter (HOSPITAL_COMMUNITY): Payer: Self-pay | Admitting: General Practice

## 2013-09-20 DIAGNOSIS — I25810 Atherosclerosis of coronary artery bypass graft(s) without angina pectoris: Secondary | ICD-10-CM

## 2013-09-20 LAB — CBC
HCT: 29.6 % — ABNORMAL LOW (ref 36.0–46.0)
Hemoglobin: 9.8 g/dL — ABNORMAL LOW (ref 12.0–15.0)
MCH: 29.9 pg (ref 26.0–34.0)
MCHC: 33.1 g/dL (ref 30.0–36.0)
MCV: 90.2 fL (ref 78.0–100.0)
Platelets: 335 10*3/uL (ref 150–400)
RBC: 3.28 MIL/uL — AB (ref 3.87–5.11)
RDW: 12.6 % (ref 11.5–15.5)
WBC: 11.3 10*3/uL — ABNORMAL HIGH (ref 4.0–10.5)

## 2013-09-20 LAB — BASIC METABOLIC PANEL
Anion gap: 15 (ref 5–15)
BUN: 11 mg/dL (ref 6–23)
CHLORIDE: 94 meq/L — AB (ref 96–112)
CO2: 27 meq/L (ref 19–32)
Calcium: 8.6 mg/dL (ref 8.4–10.5)
Creatinine, Ser: 0.77 mg/dL (ref 0.50–1.10)
GFR calc Af Amer: >90 mL/min (ref >90)
GFR calc non Af Amer: 89 mL/min — ABNORMAL LOW (ref >90)
Glucose, Bld: 110 mg/dL — ABNORMAL HIGH (ref 70–99)
POTASSIUM: 3.9 meq/L (ref 3.7–5.3)
Sodium: 136 mEq/L — ABNORMAL LOW (ref 137–147)

## 2013-09-20 LAB — GLUCOSE, CAPILLARY
GLUCOSE-CAPILLARY: 203 mg/dL — AB (ref 70–99)
GLUCOSE-CAPILLARY: 260 mg/dL — AB (ref 70–99)
Glucose-Capillary: 141 mg/dL — ABNORMAL HIGH (ref 70–99)
Glucose-Capillary: 141 mg/dL — ABNORMAL HIGH (ref 70–99)

## 2013-09-20 MED ORDER — HYDROCODONE-ACETAMINOPHEN 5-325 MG PO TABS
1.0000 | ORAL_TABLET | ORAL | Status: DC | PRN
  Administered 2013-09-20 (×3): 2 via ORAL
  Filled 2013-09-20 (×3): qty 2

## 2013-09-20 MED ORDER — INSULIN ASPART 100 UNIT/ML ~~LOC~~ SOLN
0.0000 [IU] | Freq: Three times a day (TID) | SUBCUTANEOUS | Status: DC
  Administered 2013-09-20: 1 [IU] via SUBCUTANEOUS
  Administered 2013-09-20: 3 [IU] via SUBCUTANEOUS
  Administered 2013-09-21: 2 [IU] via SUBCUTANEOUS
  Administered 2013-09-21: 3 [IU] via SUBCUTANEOUS
  Administered 2013-09-22: 5 [IU] via SUBCUTANEOUS
  Administered 2013-09-22: 3 [IU] via SUBCUTANEOUS
  Administered 2013-09-23: 1 [IU] via SUBCUTANEOUS

## 2013-09-20 MED ORDER — SODIUM CHLORIDE 0.9 % IV SOLN
INTRAVENOUS | Status: AC
  Administered 2013-09-20: 11:00:00 via INTRAVENOUS

## 2013-09-20 MED ORDER — HYDROMORPHONE HCL PF 1 MG/ML IJ SOLN
1.0000 mg | INTRAMUSCULAR | Status: DC | PRN
  Administered 2013-09-20: 1.5 mg via INTRAVENOUS
  Administered 2013-09-20 (×3): 1 mg via INTRAVENOUS
  Administered 2013-09-21: 1.5 mg via INTRAVENOUS
  Filled 2013-09-20: qty 1
  Filled 2013-09-20: qty 2
  Filled 2013-09-20 (×3): qty 1
  Filled 2013-09-20: qty 2

## 2013-09-20 MED ORDER — INSULIN ASPART 100 UNIT/ML ~~LOC~~ SOLN
0.0000 [IU] | Freq: Every day | SUBCUTANEOUS | Status: DC
  Administered 2013-09-20: 3 [IU] via SUBCUTANEOUS
  Administered 2013-09-21: 4 [IU] via SUBCUTANEOUS
  Administered 2013-09-22: 3 [IU] via SUBCUTANEOUS

## 2013-09-20 MED ORDER — HYDROMORPHONE HCL PF 1 MG/ML IJ SOLN
0.5000 mg | Freq: Once | INTRAMUSCULAR | Status: AC
  Administered 2013-09-20: 0.5 mg via INTRAVENOUS
  Filled 2013-09-20: qty 1

## 2013-09-20 NOTE — Progress Notes (Signed)
Patient Demographics  [name], is a 63 y.o. female, DOB - 1951/02/24, ZOX:[id number]  Admit date - 09/18/2013   Admitting Physician Doree AlbeeSteven Newton, MD  Outpatient Primary MD for the patient is Erika MaudlinHAWKINS,EDWARD L, MD  LOS - 2   Chief Complaint  Patient presents with  . Fall  . Hip Pain           Subjective:   Erika NonesJudy Hauss today has, No headache, No chest pain, No abdominal pain - No Nausea, No new weakness tingling or numbness, No Cough - SOB. Constant right-sided hip pain worse with movement..    Assessment & Plan    1. Mechanical fall with right-sided subcapital femoral fracture - for now supportive care with pain control, Post open reduction internal fixation later on 09/19/2013 by Dr. Eulah PontMurphy. He tolerated procedure well, aspirin at 162 mg twice a day along with home dose Plavix for DVT prophylaxis per orthopedics, weightbearing as tolerated, initiate PT. May require placement. Monitor H&H.     2.Diabetes mellitus type 1, uncontrolled, insulin dependent - continue home dose Lantus added nightly CHF sliding scale insulin.   CBG (last 3)   Recent Labs  09/19/13 2117 09/20/13 0001 09/20/13 0456  GLUCAP 190* 137* 141*      3. Essential hypertension. Continue Coreg, Norvasc, hold ARB prior to surgery. As needed IV to pressor.    4.Polymyalgia rheumatica - the home dose steroid if blood pressure falls we'll increase to stress dose steroids.,    5. 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 Status post recent PCI to SVG to RCA with stent placement. CABG in 1995. Chronic systolic heart failure last known EF of 50%. Currently aspirin, Coreg, Plavix being continued, will continue Imdur, home dose Lasix, as needed sublingual nitroglycerin. Seen by cardiology.  Stable.    6. GERD place on PPI.    7. UTI. Placed on Rocephin, monitor cultures.       Code Status: Full  Family Communication: Significant other bedside  Disposition Plan: TBD   Procedures ORIF R Femur  Dr Eulah PontMurphy 09-19-13   Consults Ortho, cards   Medications  Scheduled Meds: . amitriptyline  100 mg Oral QHS  . amLODipine  2.5 mg Oral Daily  . aspirin EC  162 mg Oral BID  . calcium-vitamin D  1 tablet Oral Daily  . carvedilol  25 mg Oral BID WC  . cefTRIAXone (ROCEPHIN)  IV  1 g Intravenous Q24H  . clopidogrel  75 mg Oral Daily  . fentaNYL  25 mcg Transdermal Q72H  . folic acid  0.5 mg Oral Daily  . furosemide  20 mg Oral Q48H  . furosemide  40 mg Oral Daily  . insulin aspart  0-9 Units Subcutaneous 6 times per day  . insulin glargine  25 Units Subcutaneous BID  . irbesartan  37.5 mg Oral Daily  . iron polysaccharides  150 mg Oral Daily  . isosorbide mononitrate  60 mg Oral BID  . latanoprost  1 drop Both Eyes QHS  . pantoprazole  40 mg Oral QAC breakfast  . potassium chloride SA  30 mEq Oral Daily  . predniSONE  10 mg Oral Daily  . sertraline  50 mg Oral Daily  . sodium chloride  3 mL Intravenous Q12H  . timolol  1 drop Both Eyes Daily   Continuous Infusions: . sodium chloride     PRN Meds:.acetaminophen, acetaminophen, ALPRAZolam, HYDROcodone-acetaminophen, HYDROmorphone (DILAUDID) injection, metoprolol, nitroGLYCERIN, polyvinyl alcohol  DVT Prophylaxis    SCDs    Lab Results  Component Value Date   PLT 335 09/20/2013    Antibiotics     Anti-infectives   Start     Dose/Rate Route Frequency Ordered Stop   09/19/13 2300  ceFAZolin (ANCEF) IVPB 2 g/50 mL premix     2 g 100 mL/hr over 30 Minutes Intravenous Every 6 hours 09/19/13 1943 09/20/13 0529   09/19/13 1200  cefTRIAXone (ROCEPHIN) 1 g in dextrose 5 % 50 mL IVPB     1 g 100 mL/hr over 30 Minutes Intravenous Every 24 hours 09/19/13 1032     09/19/13 0845  ceFAZolin (ANCEF) IVPB 2 g/50 mL  premix     2 g 100 mL/hr over 30 Minutes Intravenous On call to O.R. 09/19/13 0831 09/19/13 1656          Objective:   Filed Vitals:   09/20/13 0400 09/20/13 0500 09/20/13 0529 09/20/13 0800  BP:   151/51   Pulse:   90   Temp:   99.8 F (37.7 C)   TempSrc:      Resp: 16  16 18   Height:      Weight:  78.563 kg (173 lb 3.2 oz)    SpO2: 98%  98%     Wt Readings from Last 3 Encounters:  09/20/13 78.563 kg (173 lb 3.2 oz)  09/20/13 78.563 kg (173 lb 3.2 oz)  06/18/13 72.757 kg (160 lb 6.4 oz)     Intake/Output Summary (Last 24 hours) at 09/20/13 1017 Last data filed at 09/20/13 0650  Gross per 24 hour  Intake 1343.75 ml  Output   2800 ml  Net -1456.25 ml     Physical Exam  Awake Alert, Oriented X 2, No new F.N deficits, Normal affect [place].AT,PERRAL Supple Neck,No JVD, No cervical lymphadenopathy appriciated.  Symmetrical Chest wall movement, Good air movement bilaterally, CTAB RRR,No Gallops,Rubs , +ve soft systolic mitral valve Murmur, No Parasternal Heave +ve B.Sounds, Abd Soft, No tenderness, No organomegaly appriciated, No rebound - guarding or rigidity. No Cyanosis, Clubbing or edema, No new Rash or bruise     Data Review   Micro Results Recent Results (from the past 240 hour(s))  SURGICAL PCR SCREEN     Status: None   Collection Time    09/19/13  9:21 AM      Result Value Ref Range Status   MRSA, PCR NEGATIVE  NEGATIVE Final   Staphylococcus aureus NEGATIVE  NEGATIVE Final   Comment:            The Xpert SA Assay (FDA     approved for NASAL specimens     in patients over 44 years of age),     is one component of     a comprehensive surveillance     program.  Test performance has     been validated by [employer] for patients greater     than or equal to 41 year old.     It is not intended     to diagnose infection nor to     guide or monitor treatment.    Radiology Reports Dg Hip Complete Right  09/18/2013   CLINICAL DATA:  Right hip  pain secondary to  a fall.  EXAM: RIGHT HIP - COMPLETE 2+ VIEW  COMPARISON:  None.  FINDINGS: There is a slightly impacted subcapital fracture or right femoral neck. No displacement.  Pelvic bones are intact.  IMPRESSION: Impacted subcapital fracture of the right femoral neck.   Electronically Signed   By: Geanie Cooley M.D.   On: 09/18/2013 18:59   Dg Chest Port 1 View  09/18/2013   CLINICAL DATA:  Preop hip fracture.  Diabetes.  Hypertension.  EXAM: PORTABLE CHEST - 1 VIEW  COMPARISON:  01/07/2013  FINDINGS: Postoperative changes in the mediastinum. Normal heart size and pulmonary vascularity. Interstitial changes in the lungs probably representing fibrosis, similar to previous study no focal airspace disease. No blunting of costophrenic angles. No pneumothorax.  IMPRESSION: No active disease.   Electronically Signed   By: Burman Nieves M.D.   On: 09/18/2013 21:04    CBC  Recent Labs Lab 09/18/13 1825 09/18/13 2221 09/19/13 0349 09/20/13 0642  WBC 10.0 10.1 10.5 11.3*  HGB 11.3* 10.7* 10.4* 9.8*  HCT 33.7* 31.9* 31.0* 29.6*  PLT 364 370 379 335  MCV 89.9 88.9 88.8 90.2  MCH 30.1 29.8 29.8 29.9  MCHC 33.5 33.5 33.5 33.1  RDW 12.6 12.4 12.6 12.6  LYMPHSABS 2.1  --  3.9  --   MONOABS 0.7  --  0.9  --   EOSABS 0.2  --  0.3  --   BASOSABS 0.0  --  0.1  --     Chemistries   Recent Labs Lab 09/18/13 1825 09/18/13 2221 09/19/13 0349 09/20/13 0642  NA 134*  --  141 136*  K 4.4  --  3.7 3.9  CL 91*  --  101 94*  CO2 29  --  25 27  GLUCOSE 431*  --  48* 110*  BUN 20  --  14 11  CREATININE 0.94 0.86 0.80 0.77  CALCIUM 9.1  --  8.8 8.6  AST 17  --  17  --   ALT 18  --  14  --   ALKPHOS 96  --  84  --   BILITOT <0.2*  --  <0.2*  --    ------------------------------------------------------------------------------------------------------------------ estimated creatinine clearance is 75 ml/min (by C-G formula based on Cr of  0.77). ------------------------------------------------------------------------------------------------------------------  Recent Labs  09/18/13 2221  HGBA1C 10.4*   ------------------------------------------------------------------------------------------------------------------ No results found for this basename: CHOL, HDL, LDLCALC, TRIG, CHOLHDL, LDLDIRECT,  in the last 72 hours ------------------------------------------------------------------------------------------------------------------ No results found for this basename: TSH, T4TOTAL, FREET3, T3FREE, THYROIDAB,  in the last 72 hours ------------------------------------------------------------------------------------------------------------------ No results found for this basename: VITAMINB12, FOLATE, FERRITIN, TIBC, IRON, RETICCTPCT,  in the last 72 hours  Coagulation profile  Recent Labs Lab 09/18/13 1825  INR 0.94    No results found for this basename: DDIMER,  in the last 72 hours  Cardiac Enzymes No results found for this basename: CK, CKMB, TROPONINI, MYOGLOBIN,  in the last 168 hours ------------------------------------------------------------------------------------------------------------------ No components found with this basename: POCBNP,      Time Spent in minutes   35   Jadan Rouillard K M.D on 09/20/2013 at 10:17 AM  Between 7am to 7pm - Pager - [phone]  After 7pm go to [email or web address] - password [email or web address]  And look for the night coverage person covering for me after hours  [facility] Office  [phone]   **Disclaimer: This note may have been dictated with voice recognition software. Similar sounding words can inadvertently be transcribed and this note may contain transcription errors  which may not have been corrected upon publication of note.**

## 2013-09-20 NOTE — Evaluation (Signed)
Physical Therapy Evaluation Patient Details Name: [name] MRN: [id number] DOB: [birth year] Today's Date: 09/20/2013   History of Present Illness  pt presents post fall sustaining R hip fx, now post cannulated hip pinning.    Clinical Impression  Pt needs encouragement for amb and 2nd person present for A and safety.  Pt very HOH, but can hear best from R ear.  Pt plans to D/C to SNF for further rehab.  Will continue to follow.      Follow Up Recommendations SNF    Equipment Recommendations  Rolling walker with 5" wheels;3in1 (PT)    Recommendations for Other Services       Precautions / Restrictions Precautions Precautions: Fall Restrictions Weight Bearing Restrictions: Yes RLE Weight Bearing: Weight bearing as tolerated      Mobility  Bed Mobility Overal bed mobility: Needs Assistance Bed Mobility: Supine to Sit     Supine to sit: Mod assist;+2 for physical assistance     General bed mobility comments: cues for sequencing and safe technique.    Transfers Overall transfer level: Needs assistance Equipment used: Rolling walker (2 wheeled) Transfers: Sit to/from [employer] CorporationStand;Stand Pivot Transfers Sit to Stand: Mod assist;+2 physical assistance Stand pivot transfers: Mod assist;+2 physical assistance       General transfer comment: cues for UE use and use of RW.    Ambulation/Gait Ambulation/Gait assistance: Min assist;+2 physical assistance Ambulation Distance (Feet): 5 Feet Assistive device: Rolling walker (2 wheeled) Gait Pattern/deviations: Step-to pattern;Decreased step length - left;Decreased stance time - right;Decreased stride length;Trunk flexed     General Gait Details: pt movign slowly and leans on RW.  pt needs encouragement for amb and A with management of RW.    Stairs            Wheelchair Mobility    Modified Rankin (Stroke Patients Only)       Balance Overall balance assessment: Needs assistance Sitting-balance support: Single  extremity supported;Feet supported Sitting balance-Leahy Scale: Poor     Standing balance support: Bilateral upper extremity supported Standing balance-Leahy Scale: Poor                               Pertinent Vitals/Pain R hip 7/10 with mobility.  Premedicated.      Home Living Family/patient expects to be discharged to:: Skilled nursing facility                      Prior Function Level of Independence: Independent               Hand Dominance        Extremity/Trunk Assessment   Upper Extremity Assessment: Defer to OT evaluation           Lower Extremity Assessment: RLE deficits/detail RLE Deficits / Details: ROM and strength limited by pain and post-op weakness.         Communication   Communication: HOH  Cognition Arousal/Alertness: Awake/alert Behavior During Therapy: WFL for tasks assessed/performed Overall Cognitive Status: Within Functional Limits for tasks assessed                      General Comments      Exercises        Assessment/Plan    PT Assessment Patient needs continued PT services  PT Diagnosis Difficulty walking;Acute pain   PT Problem List Decreased strength;Decreased range of motion;Decreased activity tolerance;Decreased balance;Decreased mobility;Decreased knowledge  of use of DME;Pain  PT Treatment Interventions DME instruction;Gait training;Stair training;Functional mobility training;Therapeutic activities;Therapeutic exercise;Balance training;Patient/family education   PT Goals (Current goals can be found in the Care Plan section) Acute Rehab PT Goals Patient Stated Goal: Start working out again.   PT Goal Formulation: With patient Time For Goal Achievement: 10/04/13 Potential to Achieve Goals: Good    Frequency Min 5X/week   Barriers to discharge        Co-evaluation               End of Session Equipment Utilized During Treatment: Gait belt Activity Tolerance: Patient limited  by fatigue;Patient limited by pain Patient left: in chair;with call bell/phone within reach;with family/visitor present Nurse Communication: Mobility status         Time: 1610-9604 PT Time Calculation (min): 23 min   Charges:   PT Evaluation $Initial PT Evaluation Tier I: 1 Procedure PT Treatments $Therapeutic Activity: 8-22 mins   PT G CodesSunny Schlein, [place] [phone] 09/20/2013, 3:43 PM

## 2013-09-20 NOTE — Progress Notes (Signed)
Chaplain Note: CSW requested AD for patient on 5N. Chaplain responded to Patient's and partner's request for an AD. CSW gave and explained AD packet to both patient and partner. Patient wanted partner as POA and partner wanted patient as POA. Chaplain reviewed materials with both patient and partner, offering a time for questions and receiving none. Chaplain left patient and came back with witnesses and notary. AD was completed and a copy was left in patient's chart. Chaplain prayed with patient before leaving the room. Patient was very grateful for the visit and prayer.  Toni Amendndria Williamson  09/20/13 1400  Clinical Encounter Type  Visited With Patient;Family;Health care provider  Visit Type Initial;Spiritual support;Other (Comment) (AD Request)  Referral From Patient;Nurse  Spiritual Encounters  Spiritual Needs Prayer  Stress Factors  Patient Stress Factors Health changes

## 2013-09-20 NOTE — Progress Notes (Signed)
I participated in the care of this patient and agree with the above history, physical and evaluation. I performed a review of the history and a physical exam as detailed   Carmichael Burdette Daniel Eydie Wormley MD  

## 2013-09-20 NOTE — Progress Notes (Signed)
Patient Name: Enid DerryJudy A [place] Date of Encounter: 09/20/2013     Active Problems:   Diabetes mellitus type 1, uncontrolled, insulin dependent   Polymyalgia rheumatica   HTN (hypertension)   CAD (coronary artery disease)   Hip fracture    SUBJECTIVE  Denies chest pain. Some SOB when moving around. States she has pain "everywhere"  CURRENT MEDS . amitriptyline  100 mg Oral QHS  . amLODipine  2.5 mg Oral Daily  . aspirin EC  162 mg Oral BID  . calcium-vitamin D  1 tablet Oral Daily  . carvedilol  25 mg Oral BID WC  . cefTRIAXone (ROCEPHIN)  IV  1 g Intravenous Q24H  . clopidogrel  75 mg Oral Daily  . fentaNYL  25 mcg Transdermal Q72H  . folic acid  0.5 mg Oral Daily  . furosemide  20 mg Oral Q48H  . furosemide  40 mg Oral Daily  . insulin aspart  0-5 Units Subcutaneous QHS  . insulin aspart  0-9 Units Subcutaneous TID WC  . insulin glargine  25 Units Subcutaneous BID  . irbesartan  37.5 mg Oral Daily  . iron polysaccharides  150 mg Oral Daily  . isosorbide mononitrate  60 mg Oral BID  . latanoprost  1 drop Both Eyes QHS  . pantoprazole  40 mg Oral QAC breakfast  . potassium chloride SA  30 mEq Oral Daily  . predniSONE  10 mg Oral Daily  . sertraline  50 mg Oral Daily  . sodium chloride  3 mL Intravenous Q12H  . timolol  1 drop Both Eyes Daily    OBJECTIVE  Filed Vitals:   09/20/13 0400 09/20/13 0500 09/20/13 0529 09/20/13 0800  BP:   151/51   Pulse:   90   Temp:   99.8 F (37.7 C)   TempSrc:      Resp: 16  16 18   Height:      Weight:  173 lb 3.2 oz (78.563 kg)    SpO2: 98%  98%     Intake/Output Summary (Last 24 hours) at 09/20/13 1134 Last data filed at 09/20/13 0650  Gross per 24 hour  Intake 1343.75 ml  Output   2800 ml  Net -1456.25 ml   Filed Weights   09/18/13 1752 09/19/13 0500 09/20/13 0500  Weight: 160 lb (72.576 kg) 165 lb 3.2 oz (74.934 kg) 173 lb 3.2 oz (78.563 kg)    PHYSICAL EXAM  General: Pleasant, NAD. Neuro: Alert and oriented X 3.  Moves all extremities spontaneously. Psych: Normal affect. HEENT:  Normal  Neck: Supple without bruits or JVD. Lungs:  Resp regular and unlabored, CTA. Heart: RRR no s3, s4, or 1/6 systolic murmur. Abdomen: Soft, non-tender, non-distended, BS + x 4.  Extremities: No clubbing, cyanosis or edema. DP/PT/Radials 2+ and equal bilaterally. Surgical scar seen  Accessory Clinical Findings  CBC  Recent Labs  09/18/13 1825  09/19/13 0349 09/20/13 0642  WBC 10.0  < > 10.5 11.3*  NEUTROABS 6.9  --  5.5  --   HGB 11.3*  < > 10.4* 9.8*  HCT 33.7*  < > 31.0* 29.6*  MCV 89.9  < > 88.8 90.2  PLT 364  < > 379 335  < > = values in this interval not displayed. Basic Metabolic Panel  Recent Labs  09/19/13 0349 09/20/13 0642  NA 141 136*  K 3.7 3.9  CL 101 94*  CO2 25 27  GLUCOSE 48* 110*  BUN 14 11  CREATININE 0.80  0.77  CALCIUM 8.8 8.6   Liver Function Tests  Recent Labs  09/18/13 1825 09/19/13 0349  AST 17 17  ALT 18 14  ALKPHOS 96 84  BILITOT <0.2* <0.2*  PROT 7.6 6.7  ALBUMIN 3.5 3.1*   Hemoglobin A1C  Recent Labs  09/18/13 2221  HGBA1C 10.4*    TELE  NST with HR 70s-80s, no significant ventricular ectopy  ECG  NSR with HR 80s, nonspecific ST-T wave changes  Radiology/Studies  Dg Hip Complete Right  09/18/2013   CLINICAL DATA:  Right hip pain secondary to a fall.  EXAM: RIGHT HIP - COMPLETE 2+ VIEW  COMPARISON:  None.  FINDINGS: There is a slightly impacted subcapital fracture or right femoral neck. No displacement.  Pelvic bones are intact.  IMPRESSION: Impacted subcapital fracture of the right femoral neck.   Electronically Signed   By: Geanie Cooley M.D.   On: 09/18/2013 18:59   Dg Hip Operative Right  09/19/2013   CLINICAL DATA:  Operative images from the ORIF of a right subcapital femoral neck fracture.  EXAM: DG OPERATIVE RIGHT HIP  TECHNIQUE: A single spot fluoroscopic AP image of the right hip is submitted.  COMPARISON:  09/18/2013  FINDINGS: Three  screws reduce the subcapital fracture into near anatomic alignment. Mild residual valgus angulation is noted. Orthopedic hardware is well-seated. No evidence of a new fracture or operative complication.  IMPRESSION: ORIF of a right subcapital femoral neck fracture as described.   Electronically Signed   By: Amie Portland M.D.   On: 09/19/2013 17:41   Dg Pelvis Portable  09/19/2013   CLINICAL DATA:  Postop right hip.  EXAM: PORTABLE PELVIS 1-2 VIEWS  COMPARISON:  None.  FINDINGS: Three screws reduce a femoral neck fracture on the right. Fracture is in near anatomic alignment with mild valgus angulation. Orthopedic hardware is well-seated. No evidence of an operative complication.  IMPRESSION: Status post ORIF of a right femoral neck fracture.   Electronically Signed   By: Amie Portland M.D.   On: 09/19/2013 18:21   Dg Chest Port 1 View  09/18/2013   CLINICAL DATA:  Preop hip fracture.  Diabetes.  Hypertension.  EXAM: PORTABLE CHEST - 1 VIEW  COMPARISON:  01/07/2013  FINDINGS: Postoperative changes in the mediastinum. Normal heart size and pulmonary vascularity. Interstitial changes in the lungs probably representing fibrosis, similar to previous study no focal airspace disease. No blunting of costophrenic angles. No pneumothorax.  IMPRESSION: No active disease.   Electronically Signed   By: Burman Nieves M.D.   On: 09/18/2013 21:04    ASSESSMENT AND PLAN  1. Fall with R femur fracture s/p R ORIF of femur head yesterday  - ASA and plavix continued periop as risk of bleeding low  - surgery cleared by Dr. Elease Hashimoto  - continue to improve postop  - no sign of angina, continue monitor fluid status. If SOB continue give extra dose of lasix x1, otherwise stable from cardiac perspective (although did have 1 episode of fever yesterday, Tmax 101.6, unclear cause, UTI?)   2. Acute anemia: stable post op  3. CAD: h/o CABG 1995, DES to SVG to RCA in Nov 2014  4. Chronic systolic HF: stable  - unable to sit up  postop due to pain, mild intermittent rale at R flank, L lung appears clear  - continue current diuretic  Signed, Azalee Course PA-C Pager: [phone]  I have seen and examined the patient along with Azalee Course PA-C.  I have reviewed  the chart, notes and new data.  I agree with PA's note.  Key new complaints: surgical pain, mild dyspnea Key examination changes: crackles sound more consistent with atelectasis, rather than CHF Key new findings / data: in/out net negative 2.3L since admission  PLAN: No symptoms or signs of acute HF or acute coronary insufficiency. Continue ASA / plavix, carvedilol, irbesartan and current dose of furosemide. Please call if there are any new problems over the weekend.  Thurmon Fair, MD, [facility] [phone] 09/20/2013, 1:27 PM

## 2013-09-20 NOTE — Progress Notes (Signed)
Patient ID: [name], female   DOB: Sep 21, 1950, 63 y.o.   MRN: [id number]     Subjective:  Patient reports pain as mild to moderate.  Patient confused but is able to follow commands and denies any CP or SOB but does states that she is seeing things that are not there  Objective:   VITALS:   Filed Vitals:   09/20/13 0059 09/20/13 0400 09/20/13 0500 09/20/13 0529  BP: 126/42   151/51  Pulse: 80   90  Temp: 98.9 F (37.2 C)   99.8 F (37.7 C)  TempSrc:      Resp: 16 16  16   Height:      Weight:   78.563 kg (173 lb 3.2 oz)   SpO2: 98% 98%  98%    ABD soft Sensation intact distally Dorsiflexion/Plantar flexion intact Incision: dressing C/D/I and no drainage   Lab Results  Component Value Date   WBC 10.5 09/19/2013   HGB 10.4* 09/19/2013   HCT 31.0* 09/19/2013   MCV 88.8 09/19/2013   PLT 379 09/19/2013     Assessment/Plan: 1 Day Post-Op   Active Problems:   Diabetes mellitus type 1, uncontrolled, insulin dependent   Polymyalgia rheumatica   HTN (hypertension)   CAD (coronary artery disease)   Hip fracture   Advance diet Up with therapy Continue plan per medicine Will get with Dr Margarita Ranaimothy Murphy and plan to change Narcotic medications. WBAT Dry dressing PRN   Haskel KhanDOUGLAS Leshae Mcclay 09/20/2013, 7:26 AM   Margarita Ranaimothy Murphy MD [phone]

## 2013-09-20 NOTE — Progress Notes (Addendum)
Clinical Social Work Department CLINICAL SOCIAL WORK PLACEMENT NOTE 09/20/2013  Patient:  [name],[name]  Account Number:  [id number] Admit date:  09/18/2013  Clinical Social Worker:  Theresia BoughNORMA WILSON, Theresia MajorsLCSWA  Date/time:  09/20/2013 02:59 PM  Clinical Social Work is seeking post-discharge placement for this patient at the following level of care:   SKILLED NURSING   (*CSW will update this form in Epic as items are completed)   09/20/2013  Patient/family provided with [facility] of Clinical Social Work's list of facilities offering this level of care within the geographic area requested by the patient (or if unable, by the patient's family).  09/20/2013  Patient/family informed of their freedom to choose among providers that offer the needed level of care, that participate in Medicare, Medicaid or managed care program needed by the patient, have an available bed and are willing to accept the patient.  09/20/2013  Patient/family informed of [facility]' ownership interest in [facility], as well as of the fact that they are under no obligation to receive care at this facility.  PASARR submitted to EDS on 09/20/2013 PASARR number received on 09/20/2013  FL2 transmitted to all facilities in geographic area requested by pt/family on  09/20/2013 FL2 transmitted to all facilities within larger geographic area on   Patient informed that his/her managed care company has contracts with or will negotiate with  certain facilities, including the following:     Patient/family informed of bed offers received:  09/30/2013 [facility]ician recommends and patient chooses bed at    Patient to be transferred to  on  10/01/2013 Patient to be transferred to facility by FAMILY Patient and family notified of transfer on 10/01/2013 Name of family member notified:  10/01/2013  The following physician request were entered in Epic:   Additional  Comments:  Theresia BoughNorma Wilson, MSW, LCSW [phone]

## 2013-09-20 NOTE — Progress Notes (Signed)
CSW visited pt room to discuss SNF placement.   Before discussing placement CSW assessed pt mental status. CSW asked several questions to determine pt capacity. Pt answered all questions appropriately and appeared to be orientedx4. CSW suggested that pt complete an advanced directives packet as pt verbalized to CSW that she would like her friend/significant other who was present in the room"[name]," to be her HCPOA. Pt currently does not have a designated HCPOA. Pt is agreeable to complete paperwork and confirmed that [name] helps her make decisions. CSW requested assistance from the chaplain who will assist in having paperwork notarized today.  CSW to do a SNF search as pt would prefer placement at [facility] for ST SNF.  Full assessment to follow.  Theresia BoughNorma Yamina [name], MSW, LCSW [phone]

## 2013-09-20 NOTE — Progress Notes (Signed)
Pt unable to void since foley D/Cd at 0700. Bladder scan showed 400cc urine in the bladder. I/O done and 900cc urine out. Will continue to monitor Pts output.

## 2013-09-20 NOTE — Care Management Note (Signed)
CARE MANAGEMENT NOTE 09/20/2013  Patient:  [name],[name]   Account Number:  [id number]  Date Initiated:  09/20/2013  Documentation initiated by:  Vance PeperBRADY,Viggo Perko  Subjective/Objective Assessment:   63 yr old female s/p right femoral neck fracture with Pinning.     Action/Plan:   Patient is for shortterm Rehab at SNF. Wants [facility]. Social worker is aware and involved. [facility].   Anticipated DC Date:  09/23/2013   Anticipated DC Plan:  SKILLED NURSING FACILITY  In-house referral  Clinical Social Worker      DC Planning Services  CM consult      Gi Or NormanAC Choice  NA   Choice offered to / List presented to:     DME arranged  NA        HH arranged  NA      Status of service:  In process, will continue to follow Medicare Important Message given?   (If response is "NO", the following Medicare IM given date fields will be blank) Date Medicare IM given:   Medicare IM given by:   Date Additional Medicare IM given:   Additional Medicare IM given by:    Discharge Disposition:  SKILLED NURSING FACILITY  Per UR Regulation:  Reviewed for med. necessity/level of care/duration of stay

## 2013-09-20 NOTE — Progress Notes (Signed)
Clinical Social Work Department BRIEF PSYCHOSOCIAL ASSESSMENT 09/20/2013  Patient:  Erika [name],Erika [name]     Account Number:  [id number]     Admit date:  09/18/2013  Clinical Social Worker:  Lourdes SledgeWILSON,Kista Robb, LCSWA  Date/Time:  09/20/2013 02:42 PM  Referred by:  Physician  Date Referred:  09/20/2013 Referred for  SNF Placement  Advanced Directives   Other Referral:   Interview type:  Patient Other interview type:   CSW also completed assessment with pt significant other "Erika [name]."    PSYCHOSOCIAL DATA Living Status:  SIGNIFICANT OTHER Admitted from facility:   Level of care:  Skilled Nursing Facility Primary support name:  Erika [name] Primary support relationship to patient:  FRIEND Degree of support available:   Pt friend/sig other presents as pt main contact and support.    CURRENT CONCERNS Current Concerns  Post-Acute Placement   Other Concerns:    SOCIAL WORK ASSESSMENT / PLAN CSW visited pt room to discuss SNF placement.    Before discussing placement CSW assessed pt mental status. CSW asked several questions to determine pt capacity. Pt answered all questions appropriately and appeared to be orientedx4. CSW suggested that pt complete an advanced directives packet as pt verbalized to CSW that she would like her friend/significant other who was present in the room"[name]," to be her HCPOA. Pt currently does not have [name] designated HCPOA. Pt is agreeable to complete paperwork and confirmed that Erika [name] helps her make decisions. CSW requested assistance from the chaplain who will assist in having paperwork notarized today.      CSW to do [name] SNF search as pt would prefer placement at [facility] for ST SNF.    CSW will follow up with bed offers.   Assessment/plan status:  Psychosocial Support/Ongoing Assessment of Needs Other assessment/ plan:   Information/referral to community resources:   CSW provided pt with [name] SNF list, CSW contact number and 2 packets of advanced directives.     PATIENT'S/FAMILY'S RESPONSE TO PLAN OF CARE: Pt was alert and oriented however very hard of hearing. Pt can read lips, and understand if you speak loudly next to her. Pt became tearful when discussing going to [name] SNF however she is agreeable to go for [name] short time.       Erika [name], MSW, LCSW [phone]

## 2013-09-20 NOTE — Anesthesia Postprocedure Evaluation (Signed)
Anesthesia Post Note  Patient: [name]  Procedure(s) Performed: Procedure(s) (LRB): CANNULATED HIP PINNING (Right)  Anesthesia type: General  Patient location: PACU  Post pain: Pain level controlled  Post assessment: Patient's Cardiovascular Status Stable  Last Vitals:  Filed Vitals:   09/20/13 1600  BP:   Pulse:   Temp:   Resp: 18    Post vital signs: Reviewed and stable  Level of consciousness: alert  Complications: No apparent anesthesia complications

## 2013-09-20 NOTE — Evaluation (Signed)
Occupational Therapy Evaluation Patient Details Name: [name] MRN: [id number] DOB: [birth year] Today's Date: 09/20/2013    History of Present Illness pt presents post fall sustaining R hip fx, now post cannulated hip pinning.     Clinical Impression   This 63 yo female admitted and underwent above presents to acute OT with decreased mobility, increased pain, decreased balance, and extreme HOH all affecting pt's ability to care for herself. She will benefit from acute OT with follow up at SNF.    Follow Up Recommendations  SNF    Equipment Recommendations   (TBD at next venue)       Precautions / Restrictions Precautions Precautions: Fall Restrictions Weight Bearing Restrictions: Yes RLE Weight Bearing: Weight bearing as tolerated      Mobility Bed Mobility Overal bed mobility: Needs Assistance Bed Mobility: Supine to Sit     Supine to sit: Mod assist;+2 for physical assistance     General bed mobility comments: cues for sequencing and safe technique.    Transfers Overall transfer level: Needs assistance Equipment used: Rolling walker (2 wheeled) Transfers: Sit to/from [employer];Stand Pivot Transfers Sit to Stand: Mod assist;+2 physical assistance Stand pivot transfers: Mod assist;+2 physical assistance       General transfer comment: cues for UE use and use of RW.      Balance Overall balance assessment: Needs assistance Sitting-balance support: Single extremity supported;Feet supported Sitting balance-Leahy Scale: Poor     Standing balance support: Bilateral upper extremity supported Standing balance-Leahy Scale: Poor                              ADL Overall ADL's : Needs assistance/impaired Eating/Feeding: Independent;Sitting   Grooming: Set up;Sitting   Upper Body Bathing: Set up;Sitting   Lower Body Bathing: Maximal assistance;Sit to/from stand   Upper Body Dressing : Minimal assistance;Sitting   Lower Body Dressing: Total  assistance;Sit to/from stand   Toilet Transfer: Moderate assistance;+2 for physical assistance;Stand-pivot;BSC;RW   Toileting- Clothing Manipulation and Hygiene: Total assistance;Sit to/from stand (with extra person help to maintain standing)                         Pertinent Vitals/Pain FACES 4/10; repositioned     Hand Dominance Right   Extremity/Trunk Assessment Upper Extremity Assessment Upper Extremity Assessment: Generalized weakness   Lower Extremity Assessment Lower Extremity Assessment: RLE deficits/detail RLE Deficits / Details: ROM and strength limited by pain and post-op weakness.   RLE: Unable to fully assess due to pain       Communication Communication Communication: HOH   Cognition Arousal/Alertness: Awake/alert Behavior During Therapy: WFL for tasks assessed/performed Overall Cognitive Status:  (decreased problem solving, but may be due mainly to [facility])                                Home Living Family/patient expects to be discharged to:: Skilled nursing facility                                        Prior Functioning/Environment Level of Independence: Independent             OT Diagnosis: Generalized weakness;Acute pain;Cognitive deficits   OT Problem List: Decreased strength;Decreased range of motion;Decreased activity tolerance;Impaired balance (sitting and/or  standing);Pain;Decreased knowledge of use of DME or AE   OT Treatment/Interventions: Self-care/ADL training;Patient/family education;Balance training;Therapeutic activities;DME and/or AE instruction    OT Goals(Current goals can be found in the care plan section) Acute Rehab OT Goals Patient Stated Goal: to go to rehab and then home OT Goal Formulation: With patient Time For Goal Achievement: 09/27/13 Potential to Achieve Goals: Good  OT Frequency: Min 2X/week           Co-evaluation PT/OT/SLP Co-Evaluation/Treatment: Yes Reason for  Co-Treatment: For patient/therapist safety   OT goals addressed during session: ADL's and self-care      End of Session Equipment Utilized During Treatment: Gait belt;Rolling walker  Activity Tolerance: Patient limited by fatigue Patient left: in chair;with call bell/phone within reach;with family/visitor present   Time: 1610-9604 OT Time Calculation (min): 18 min Charges:  OT General Charges $OT Visit: 1 Procedure OT Evaluation $Initial OT Evaluation Tier I: 1 Procedure   Evette Georges [phone] 09/20/2013, 4:38 PM

## 2013-09-21 LAB — GLUCOSE, CAPILLARY
GLUCOSE-CAPILLARY: 84 mg/dL (ref 70–99)
GLUCOSE-CAPILLARY: 196 mg/dL — AB (ref 70–99)
Glucose-Capillary: 201 mg/dL — ABNORMAL HIGH (ref 70–99)
Glucose-Capillary: 219 mg/dL — ABNORMAL HIGH (ref 70–99)
Glucose-Capillary: 329 mg/dL — ABNORMAL HIGH (ref 70–99)

## 2013-09-21 LAB — HEMOGLOBIN AND HEMATOCRIT, BLOOD
HCT: 25 % — ABNORMAL LOW (ref 36.0–46.0)
Hemoglobin: 8.5 g/dL — ABNORMAL LOW (ref 12.0–15.0)

## 2013-09-21 LAB — BASIC METABOLIC PANEL
Anion gap: 13 (ref 5–15)
BUN: 15 mg/dL (ref 6–23)
CO2: 27 meq/L (ref 19–32)
Calcium: 8.6 mg/dL (ref 8.4–10.5)
Chloride: 97 mEq/L (ref 96–112)
Creatinine, Ser: 0.79 mg/dL (ref 0.50–1.10)
GFR calc Af Amer: >90 mL/min (ref >90)
GFR calc non Af Amer: 88 mL/min — ABNORMAL LOW (ref >90)
Glucose, Bld: 93 mg/dL (ref 70–99)
POTASSIUM: 4 meq/L (ref 3.7–5.3)
SODIUM: 137 meq/L (ref 137–147)

## 2013-09-21 LAB — TROPONIN I: TROPONIN I: 0.41 ng/mL — AB (ref <0.30)

## 2013-09-21 MED ORDER — FUROSEMIDE 40 MG PO TABS
40.0000 mg | ORAL_TABLET | Freq: Every day | ORAL | Status: DC
  Administered 2013-09-22 – 2013-10-01 (×10): 40 mg via ORAL
  Filled 2013-09-21 (×11): qty 1

## 2013-09-21 MED ORDER — ISOSORBIDE MONONITRATE ER 30 MG PO TB24: 30.0000 mg | ORAL_TABLET | Freq: Every day | ORAL | Status: DC

## 2013-09-21 MED ORDER — HYDROMORPHONE HCL PF 1 MG/ML IJ SOLN
0.5000 mg | INTRAMUSCULAR | Status: DC | PRN
  Administered 2013-09-21 – 2013-09-24 (×11): 0.5 mg via INTRAVENOUS
  Administered 2013-09-25: 01:00:00 via INTRAVENOUS
  Administered 2013-09-26: 0.5 mg via INTRAVENOUS
  Filled 2013-09-21 (×15): qty 1

## 2013-09-21 MED ORDER — CARVEDILOL 3.125 MG PO TABS
3.1250 mg | ORAL_TABLET | Freq: Two times a day (BID) | ORAL | Status: DC
  Administered 2013-09-21 – 2013-09-23 (×4): 3.125 mg via ORAL
  Filled 2013-09-21 (×7): qty 1

## 2013-09-21 MED ORDER — HYDROCODONE-ACETAMINOPHEN 5-325 MG PO TABS
1.0000 | ORAL_TABLET | ORAL | Status: DC | PRN
  Administered 2013-09-21 – 2013-10-01 (×30): 1 via ORAL
  Filled 2013-09-21 (×23): qty 1
  Filled 2013-09-21: qty 2
  Filled 2013-09-21 (×6): qty 1

## 2013-09-21 MED ORDER — HEPARIN (PORCINE) IN NACL 100-0.45 UNIT/ML-% IJ SOLN
1050.0000 [IU]/h | INTRAMUSCULAR | Status: DC
  Administered 2013-09-21: 900 [IU]/h via INTRAVENOUS
  Administered 2013-09-22: 1050 [IU]/h via INTRAVENOUS
  Filled 2013-09-21 (×3): qty 250

## 2013-09-21 MED ORDER — HEPARIN BOLUS VIA INFUSION
4000.0000 [IU] | Freq: Once | INTRAVENOUS | Status: AC
  Administered 2013-09-21: 4000 [IU] via INTRAVENOUS
  Filled 2013-09-21: qty 4000

## 2013-09-21 MED ORDER — SODIUM CHLORIDE 0.9 % IV BOLUS (SEPSIS)
250.0000 mL | INTRAVENOUS | Status: DC | PRN
  Administered 2013-09-22: 250 mL via INTRAVENOUS

## 2013-09-21 MED ORDER — FUROSEMIDE 10 MG/ML IJ SOLN
10.0000 mg | Freq: Once | INTRAMUSCULAR | Status: AC
  Administered 2013-09-21: 10 mg via INTRAVENOUS
  Filled 2013-09-21: qty 2

## 2013-09-21 MED ORDER — ALPRAZOLAM 0.25 MG PO TABS
0.2500 mg | ORAL_TABLET | Freq: Three times a day (TID) | ORAL | Status: DC | PRN
  Administered 2013-09-21 – 2013-09-26 (×16): 0.25 mg via ORAL
  Filled 2013-09-21 (×16): qty 1

## 2013-09-21 MED ORDER — SODIUM CHLORIDE 0.9 % IV BOLUS (SEPSIS)
500.0000 mL | Freq: Once | INTRAVENOUS | Status: AC
  Administered 2013-09-21: 500 mL via INTRAVENOUS

## 2013-09-21 MED ORDER — FUROSEMIDE 20 MG PO TABS: 20.0000 mg | ORAL_TABLET | ORAL | Status: DC

## 2013-09-21 MED ORDER — ISOSORBIDE MONONITRATE ER 30 MG PO TB24: 30.0000 mg | ORAL_TABLET | Freq: Two times a day (BID) | ORAL | Status: DC

## 2013-09-21 MED ORDER — ISOSORBIDE MONONITRATE ER 30 MG PO TB24
30.0000 mg | ORAL_TABLET | Freq: Every day | ORAL | Status: DC
  Administered 2013-09-21 – 2013-09-23 (×3): 30 mg via ORAL
  Filled 2013-09-21 (×3): qty 1

## 2013-09-21 MED ORDER — HYDROCORTISONE NA SUCCINATE PF 100 MG IJ SOLR
100.0000 mg | Freq: Three times a day (TID) | INTRAMUSCULAR | Status: DC
  Administered 2013-09-21 – 2013-09-22 (×4): 100 mg via INTRAVENOUS
  Filled 2013-09-21 (×7): qty 2

## 2013-09-21 MED ORDER — NITROGLYCERIN IN D5W 200-5 MCG/ML-% IV SOLN
2.0000 ug/min | INTRAVENOUS | Status: DC
  Administered 2013-09-22: 5 ug/min via INTRAVENOUS
  Filled 2013-09-21: qty 250

## 2013-09-21 NOTE — Progress Notes (Signed)
Clinical Social Worker (CSW) met with patient and her partner [name] was at bed side. CSW gave patient and [name] bed offers. [name] reported that she would look over the offers and call CSW when she makes a decision. Patient reported to CSW that she felt discomfort during a catheterization. CSW made RN and charge RN aware of above. CSW will continue to follow and above.    Morgan, LCSWA Weekend CSW [phone]     

## 2013-09-21 NOTE — Progress Notes (Signed)
Subjective: 2 Days Post-Op Procedure(s) (LRB): CANNULATED HIP PINNING (Right) Patient reports pain as 3 on 0-10 scale.    Objective: Vital signs in last 24 hours: Temp:  [97.7 F (36.5 C)-98.1 F (36.7 C)] 98 F (36.7 C) (07/11 0532) Pulse Rate:  [64-81] 72 (07/11 0700) Resp:  [17-20] 20 (07/11 0532) BP: (85-128)/(32-53) 128/53 mmHg (07/11 0700) SpO2:  [90 %-98 %] 94 % (07/11 0700) Weight:  [75.705 kg (166 lb 14.4 oz)] 75.705 kg (166 lb 14.4 oz) (07/11 0513)  Intake/Output from previous day: 07/10 0701 - 07/11 0700 In: 320 [P.O.:320] Out: 400 [Urine:400] Intake/Output this shift: Total I/O In: 120 [P.O.:120] Out: 325 [Urine:325]   Recent Labs  09/18/13 1825 09/18/13 2221 09/19/13 0349 09/20/13 0642 09/21/13 0544  HGB 11.3* 10.7* 10.4* 9.8* 8.5*    Recent Labs  09/19/13 0349 09/20/13 0642 09/21/13 0544  WBC 10.5 11.3*  --   RBC 3.49* 3.28*  --   HCT 31.0* 29.6* 25.0*  PLT 379 335  --     Recent Labs  09/20/13 0642 09/21/13 0544  NA 136* 137  K 3.9 4.0  CL 94* 97  CO2 27 27  BUN 11 15  CREATININE 0.77 0.79  GLUCOSE 110* 93  CALCIUM 8.6 8.6    Recent Labs  09/18/13 1825  INR 0.94    ABD soft Neurovascular intact Sensation intact distally Intact pulses distally Dorsiflexion/Plantar flexion intact Incision: dressing C/D/I Patient is very hard of hearing  Assessment/Plan: 2 Days Post-Op Procedure(s) (LRB): CANNULATED HIP PINNING (Right) Advance diet Up with therapy Discharge to SNF on Monday    Patient prefers [facility]  Demarco Bacci J 09/21/2013, 10:34 AM

## 2013-09-21 NOTE — Progress Notes (Addendum)
Pt's BP 85/39. Lenny Pastelom Callahan, NP notified. Received orders for a 500cc NS fluid bolus and to continue IV fluid running. Nursing will continue to monitor.

## 2013-09-21 NOTE — Progress Notes (Signed)
At 1720 patient complains of intense chest pain and tingling radiating down both arms, denies SOB or dizziness.  Patient started on 2L Oxygen, CBG 219, BP 125/45. Stat EKG done. Dr. Thedore MinsSingh paged and made aware. Orders placed for 250 cc NS bolus, Nitro sublingual, and stat Troponin. Rapid Response paged. 250 cc NS bolus started and 0.4 Nitro given sublingual at 1730 with BP 170/75. Patient still complains of chest pain, 2nd Nitro given at 1735 with BP 163/64. 0.5 dilaudid IV given at 1740, patients BP 147/59. 1745 patients states pain has greatly improved, BP 145/53 pulse 72. Attending Dr. Thedore MinsSingh and Cardiology made aware. IMDUR stat ordered.  BP 148/53 at 1815. Patient denies pain and states she feels "much better". Will continue to monitor.

## 2013-09-21 NOTE — Progress Notes (Signed)
ANTICOAGULATION CONSULT NOTE - Initial Consult  Pharmacy Consult for heparin Indication: chest pain/ACS  Allergies  Allergen Reactions  . Codeine     Unknown  . Flagyl [Metronidazole]     Unknown  . Fluocinonide     Unknown  . Ranexa [Ranolazine] Other (See Comments)    Elevated liver function  . Statins Other (See Comments)    Muscle aches  . Biaxin [Clarithromycin] Rash  . Demerol [Meperidine] Rash    Patient Measurements: Height: 5\' 4"  (162.6 cm) Weight: 166 lb 14.4 oz (75.705 kg) IBW/kg (Calculated) : 54.7 Heparin Dosing Weight: 70kg  Vital Signs: Temp: 98.2 F (36.8 C) (07/11 2022) Temp src: Oral (07/11 2022) BP: 148/49 mmHg (07/11 2022) Pulse Rate: 77 (07/11 2022)  Labs:  Recent Labs  09/18/13 2221 09/19/13 0349 09/20/13 0642 09/21/13 0544 09/21/13 1849  HGB 10.7* 10.4* 9.8* 8.5*  --   HCT 31.9* 31.0* 29.6* 25.0*  --   PLT 370 379 335  --   --   CREATININE 0.86 0.80 0.77 0.79  --   TROPONINI  --   --   --   --  0.41*    Estimated Creatinine Clearance: 73.6 ml/min (by C-G formula based on Cr of 0.79).   Medical History: Past Medical History  Diagnosis Date  . Respiratory arrest   . COPD (chronic obstructive pulmonary disease)   . CHF (congestive heart failure)   . Diabetes mellitus     insulin-dep  . Fibromyalgia   . Deaf     bilateral neurosensory hearing loss - Dr. Narda Bondshris [name]  . Myocardial infarction   . Stroke   . GERD (gastroesophageal reflux disease)   . Depression   . Hypertension   . Pneumonia   . Coronary artery disease     a. h/o CABG  . Polymyalgia rheumatica   . Unstable angina   . PAD (peripheral artery disease)     LEAs 05/10/2012 - L SFA 50-69% diameter reduction, bilat ICA with 0-49% diameter reduction  . Tobacco abuse   . History of nuclear stress test 11/2011    lexiscan; low risk, non-diagnostic for ischemia    Assessment: [age] YOF who this evening complained of intense pain and tingling down both arms. She  received nitro SL. Initial troponin elevated. She last received ASA earlier this morning- she is on 162mg  PO BID.  Baseline Hgb 8.5, plts 335. No bleeding noted. She is not on anticoagulants.  Goal of Therapy:  Heparin level 0.3-0.7 units/ml Monitor platelets by anticoagulation protocol: Yes   Plan:  1. Heparin bolus with 4000 units IV x1, then start drip at 900 units/hr 2. Heparin level in 6 hours 3. Daily HL and CBC 4. Follow for s/s bleeding  [name] D. [name], PharmD, BCPS Clinical Pharmacist Pager: [phone] 09/21/2013 9:00 PM

## 2013-09-21 NOTE — Progress Notes (Signed)
Alerted to patient with chest pain - on my arrival patient had received NTG x3 SL with 0.5mg  Dilaudid per order Dr. Thedore MinsSingh.  Chest pain has now resolved.  Patient is alert - warm and dry - pale - states the pain is typical of her angina at home - and has now resolved.  Today dose of Imdur and Norvasc held today due to soft BP's - Imdur reordered per Dr. Thedore MinsSingh.  Troponin drawn.  Lungs clear.  No SOB.  SR on 12 lead - no ST elevation noted.  Dr. Theodoro Parmaritouru to see. Follow as needed.

## 2013-09-21 NOTE — Progress Notes (Signed)
Bladder scan done at 1000 showing 420 cc's urine in bladder. [facility] and was able to void 325. Will continue to monitor output.

## 2013-09-21 NOTE — Progress Notes (Signed)
Subjective/Objective 63 year old female admitted for right sided femoral fracture with chest pain reported earlier today. EKG showed normal sinus rhythm ST & T wave abnormality, consider lateral ischemia at 5:34pm. 1st Troponin 0.41. Place on Heparin drip and continued on ASA and Plavis. Second episode of chest pain around 10:30 pm treated with sublingual NTG.    Scheduled Meds: . amitriptyline  100 mg Oral QHS  . amLODipine  2.5 mg Oral Daily  . aspirin EC  162 mg Oral BID  . calcium-vitamin D  1 tablet Oral Daily  . carvedilol  3.125 mg Oral BID WC  . clopidogrel  75 mg Oral Daily  . fentaNYL  25 mcg Transdermal Q72H  . folic acid  0.5 mg Oral Daily  . [START ON 09/22/2013] furosemide  40 mg Oral Daily  . hydrocortisone sod succinate (SOLU-CORTEF) inj  100 mg Intravenous Q8H  . insulin aspart  0-5 Units Subcutaneous QHS  . insulin aspart  0-9 Units Subcutaneous TID WC  . insulin glargine  25 Units Subcutaneous BID  . iron polysaccharides  150 mg Oral Daily  . isosorbide mononitrate  30 mg Oral Daily  . latanoprost  1 drop Both Eyes QHS  . pantoprazole  40 mg Oral QAC breakfast  . potassium chloride SA  30 mEq Oral Daily  . sertraline  50 mg Oral Daily  . sodium chloride  3 mL Intravenous Q12H  . timolol  1 drop Both Eyes Daily   Continuous Infusions: . heparin 900 Units/hr (09/21/13 2140)   PRN Meds:acetaminophen, ALPRAZolam, HYDROcodone-acetaminophen, HYDROmorphone (DILAUDID) injection, metoprolol, nitroGLYCERIN, polyvinyl alcohol, sodium chloride  Vital signs in last 24 hours: Temp:  [97.5 F (36.4 C)-98.2 F (36.8 C)] 98.2 F (36.8 C) (07/11 2022) Pulse Rate:  [64-84] 76 (07/11 2202) Resp:  [18-20] 20 (07/11 2202) BP: (85-170)/(39-75) 147/55 mmHg (07/11 2202) SpO2:  [92 %-98 %] 98 % (07/11 2202) Weight:  [75.705 kg (166 lb 14.4 oz)] 75.705 kg (166 lb 14.4 oz) (07/11 0513)  Intake/Output last 3 shifts: I/O last 3 completed shifts: In: 800 [P.O.:800] Out: 925  [Urine:925] Intake/Output this shift:  Physical Examination: General appearance - alert, oriented, reports chest pain relieved by NTG. Heart - normal rate and regular rhythm   Problem Assessment/Plan  Chest pain with positive troponin: Cardiology contacted earlier. Start on Heparin drip. Transfer to telemetry unit.

## 2013-09-21 NOTE — Progress Notes (Signed)
Pt unable to void since last I/O cath at 1830. Bladder scan showed 390 cc urine in bladder. I/O cath performed with 400 cc urine out. Will continue to monitor

## 2013-09-21 NOTE — Progress Notes (Addendum)
Cross Cover Note  Pt had episode of CP today around 5:30pm - 9/10 in severity, relieved by multiple SL nitro tabs. ECG with stable repol changes in lateral leads most likely related to LVH in context. TnI checked and was positive at 0.41. I recommended heparin gtt for NSTEMI and it was started.  She had a recurrence of chest pain around 10:30pm. It too was relieved by SL NTG.   Impression - peri-op NSTEMI  Recs 1. Continue ASA, plavix, BB, UFH 2. Start IV NTG drip 3. Probable cath on Monday 4. Cardiology will continue to follow  Glori Luisaniel Renu Asby, MD  245am update. No CP on IV NTG gtt. Does have a bit of HA, likely d/t nitro. Continue above plan.

## 2013-09-21 NOTE — Progress Notes (Signed)
Patient Demographics  [name], is a 63 y.o. female, DOB - 05-09-1950, UJW:[id number]  Admit date - 09/18/2013   Admitting Physician Doree Albee, MD  Outpatient Primary MD for the patient is Fredirick Maudlin, MD  LOS - 3   Chief Complaint  Patient presents with  . Fall  . Hip Pain        Brief summary. 63 year old Caucasian female with history of CAD, mild systolic CHF, chronic pain was admitted for mechanical fall causing right-sided femoral fracture, seen by cardiology and orthopedics underwent surgical correction by orthopedics on 09/19/2013. Main challenge is to minimize narcotic use, will require placement. Blood pressure slightly soft due to overuse of narcotics and due to relative adrenal insufficiency due to being on steroids chronically, blood pressure medications adjusted switch to stress dose steroids, minimize narcotics.    Subjective:   [name] today has, No headache, No chest pain, No abdominal pain - No Nausea, No new weakness tingling or numbness, No Cough - SOB. He is comfortable but complains of right-sided hip pain 10/10. Is currently having breakfast.    Assessment & Plan    1. Mechanical fall with right-sided subcapital femoral fracture - for now supportive care with pain control, Post open reduction internal fixation later on 09/19/2013 by Dr. Eulah Pont. He tolerated procedure well, aspirin at 162 mg twice a day along with home dose Plavix for DVT prophylaxis per orthopedics, weightbearing as tolerated, initiate PT. she will likely require placement. Monitor H&H.     2.Diabetes mellitus type 1, uncontrolled, insulin dependent - continue home dose Lantus added ISS.   CBG (last 3)   Recent Labs  09/20/13 1642 09/20/13 2151 09/21/13 0628  GLUCAP 141* 260* 84       3. Essential hypertension. Blood pressure soft, blood pressure medications have been adjusted today, switched to stress dose steroids, have reduced diuretic today, on it her BP closely adjust medications as needed. We'll leave her on low-dose Coreg for now.    4.Polymyalgia rheumatica - chronically on by mouth steroids, blood pressure soft switch to stress dose steroids and monitor. Likely can stop stress dose steroids if blood pressure stable and switched to home dose prednisone.    5. 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 Status post recent PCI to SVG to RCA with stent placement. CABG in 1995. Chronic right systolic heart failure last known EF of 50%. Currently aspirin, Coreg, Plavix being continued, will continue Imdur, home dose Lasix, as needed sublingual nitroglycerin. Able from cardiac standpoint, cardiology also following.    6. GERD place on PPI.    7. UTI. Placed on Rocephin x 3, monitor cultures.       Code Status: Full  Family Communication: Significant other bedside  Disposition Plan: Likely SNF   Procedures ORIF R Femur  Dr Eulah Pont 09-19-13   Consults Ortho, cards   Medications  Scheduled Meds: . amitriptyline  100 mg Oral QHS  . amLODipine  2.5 mg Oral Daily  . aspirin EC  162 mg Oral BID  . calcium-vitamin D  1 tablet Oral Daily  . carvedilol  3.125 mg Oral BID WC  . cefTRIAXone (ROCEPHIN)  IV  1 g Intravenous Q24H  . clopidogrel  75 mg Oral Daily  .  fentaNYL  25 mcg Transdermal Q72H  . folic acid  0.5 mg Oral Daily  . furosemide  10 mg Intravenous Once  . [START ON 09/22/2013] furosemide  40 mg Oral Daily  . hydrocortisone sod succinate (SOLU-CORTEF) inj  100 mg Intravenous Q8H  . insulin aspart  0-5 Units Subcutaneous QHS  . insulin aspart  0-9 Units Subcutaneous TID WC  . insulin glargine  25 Units Subcutaneous BID  . iron polysaccharides  150 mg Oral Daily  . [START ON 09/22/2013] isosorbide mononitrate  30 mg Oral Daily   . latanoprost  1 drop Both Eyes QHS  . pantoprazole  40 mg Oral QAC breakfast  . potassium chloride SA  30 mEq Oral Daily  . sertraline  50 mg Oral Daily  . sodium chloride  3 mL Intravenous Q12H  . timolol  1 drop Both Eyes Daily   Continuous Infusions:   PRN Meds:.acetaminophen, ALPRAZolam, HYDROcodone-acetaminophen, HYDROmorphone (DILAUDID) injection, metoprolol, nitroGLYCERIN, polyvinyl alcohol  DVT Prophylaxis    SCDs    Lab Results  Component Value Date   PLT 335 09/20/2013    Antibiotics     Anti-infectives   Start     Dose/Rate Route Frequency Ordered Stop   09/19/13 2300  ceFAZolin (ANCEF) IVPB 2 g/50 mL premix     2 g 100 mL/hr over 30 Minutes Intravenous Every 6 hours 09/19/13 1943 09/20/13 0529   09/19/13 1200  cefTRIAXone (ROCEPHIN) 1 g in dextrose 5 % 50 mL IVPB     1 g 100 mL/hr over 30 Minutes Intravenous Every 24 hours 09/19/13 1032     09/19/13 0845  ceFAZolin (ANCEF) IVPB 2 g/50 mL premix     2 g 100 mL/hr over 30 Minutes Intravenous On call to O.R. 09/19/13 0831 09/19/13 1656          Objective:   Filed Vitals:   09/21/13 0400 09/21/13 0513 09/21/13 0532 09/21/13 0700  BP:   85/39 128/53  Pulse:   64 72  Temp:   98 F (36.7 C)   TempSrc:   Oral   Resp: 20  20   Height:      Weight:  75.705 kg (166 lb 14.4 oz)    SpO2: 93%  93% 94%    Wt Readings from Last 3 Encounters:  09/21/13 75.705 kg (166 lb 14.4 oz)  09/21/13 75.705 kg (166 lb 14.4 oz)  06/18/13 72.757 kg (160 lb 6.4 oz)     Intake/Output Summary (Last 24 hours) at 09/21/13 0941 Last data filed at 09/21/13 0800  Gross per 24 hour  Intake    320 ml  Output    400 ml  Net    -80 ml     Physical Exam  Awake Alert, Oriented X 2, No new F.N deficits, Normal affect [place].AT,PERRAL Supple Neck,No JVD, No cervical lymphadenopathy appriciated.  Symmetrical Chest wall movement, Good air movement bilaterally, CTAB RRR,No Gallops,Rubs , +ve soft systolic mitral valve Murmur, No  Parasternal Heave +ve B.Sounds, Abd Soft, No tenderness, No organomegaly appriciated, No rebound - guarding or rigidity. No Cyanosis, Clubbing or edema, No new Rash or bruise     Data Review   Micro Results Recent Results (from the past 240 hour(s))  SURGICAL PCR SCREEN     Status: None   Collection Time    09/19/13  9:21 AM      Result Value Ref Range Status   MRSA, PCR NEGATIVE  NEGATIVE Final   Staphylococcus aureus NEGATIVE  NEGATIVE Final   Comment:            The Xpert SA Assay (FDA     approved for NASAL specimens     in patients over 84 years of age),     is one component of     a comprehensive surveillance     program.  Test performance has     been validated by [employer] for patients greater     than or equal to 35 year old.     It is not intended     to diagnose infection nor to     guide or monitor treatment.    Radiology Reports Dg Hip Complete Right  09/18/2013   CLINICAL DATA:  Right hip pain secondary to a fall.  EXAM: RIGHT HIP - COMPLETE 2+ VIEW  COMPARISON:  None.  FINDINGS: There is a slightly impacted subcapital fracture or right femoral neck. No displacement.  Pelvic bones are intact.  IMPRESSION: Impacted subcapital fracture of the right femoral neck.   Electronically Signed   By: Geanie Cooley M.D.   On: 09/18/2013 18:59   Dg Chest Port 1 View  09/18/2013   CLINICAL DATA:  Preop hip fracture.  Diabetes.  Hypertension.  EXAM: PORTABLE CHEST - 1 VIEW  COMPARISON:  01/07/2013  FINDINGS: Postoperative changes in the mediastinum. Normal heart size and pulmonary vascularity. Interstitial changes in the lungs probably representing fibrosis, similar to previous study no focal airspace disease. No blunting of costophrenic angles. No pneumothorax.  IMPRESSION: No active disease.   Electronically Signed   By: Burman Nieves M.D.   On: 09/18/2013 21:04    CBC  Recent Labs Lab 09/18/13 1825 09/18/13 2221 09/19/13 0349 09/20/13 0642 09/21/13 0544  WBC  10.0 10.1 10.5 11.3*  --   HGB 11.3* 10.7* 10.4* 9.8* 8.5*  HCT 33.7* 31.9* 31.0* 29.6* 25.0*  PLT 364 370 379 335  --   MCV 89.9 88.9 88.8 90.2  --   MCH 30.1 29.8 29.8 29.9  --   MCHC 33.5 33.5 33.5 33.1  --   RDW 12.6 12.4 12.6 12.6  --   LYMPHSABS 2.1  --  3.9  --   --   MONOABS 0.7  --  0.9  --   --   EOSABS 0.2  --  0.3  --   --   BASOSABS 0.0  --  0.1  --   --     Chemistries   Recent Labs Lab 09/18/13 1825 09/18/13 2221 09/19/13 0349 09/20/13 0642 09/21/13 0544  NA 134*  --  141 136* 137  K 4.4  --  3.7 3.9 4.0  CL 91*  --  101 94* 97  CO2 29  --  25 27 27   GLUCOSE 431*  --  48* 110* 93  BUN 20  --  14 11 15   CREATININE 0.94 0.86 0.80 0.77 0.79  CALCIUM 9.1  --  8.8 8.6 8.6  AST 17  --  17  --   --   ALT 18  --  14  --   --   ALKPHOS 96  --  84  --   --   BILITOT <0.2*  --  <0.2*  --   --    ------------------------------------------------------------------------------------------------------------------ estimated creatinine clearance is 73.6 ml/min (by C-G formula based on Cr of 0.79). ------------------------------------------------------------------------------------------------------------------  Recent Labs  09/18/13 2221  HGBA1C 10.4*   ------------------------------------------------------------------------------------------------------------------ No results found for  this basename: CHOL, HDL, LDLCALC, TRIG, CHOLHDL, LDLDIRECT,  in the last 72 hours ------------------------------------------------------------------------------------------------------------------ No results found for this basename: TSH, T4TOTAL, FREET3, T3FREE, THYROIDAB,  in the last 72 hours ------------------------------------------------------------------------------------------------------------------ No results found for this basename: VITAMINB12, FOLATE, FERRITIN, TIBC, IRON, RETICCTPCT,  in the last 72 hours  Coagulation profile  Recent Labs Lab 09/18/13 1825  INR 0.94     No results found for this basename: DDIMER,  in the last 72 hours  Cardiac Enzymes No results found for this basename: CK, CKMB, TROPONINI, MYOGLOBIN,  in the last 168 hours ------------------------------------------------------------------------------------------------------------------ No components found with this basename: POCBNP,      Time Spent in minutes   35   Susa RaringSINGH,Jaquasha Carnevale K M.D on 09/21/2013 at 9:41 AM  Between 7am to 7pm - Pager - [phone]  After 7pm go to [email or web address] - password [email or web address]  And look for the night coverage person covering for me after hours  [facility] Office  [phone]   **Disclaimer: This note may have been dictated with voice recognition software. Similar sounding words can inadvertently be transcribed and this note may contain transcription errors which may not have been corrected upon publication of note.**

## 2013-09-21 NOTE — Progress Notes (Signed)
Physical Therapy Treatment Patient Details Name: [name] MRN: [id number] DOB: 1950-07-09 Today's Date: 09/21/2013    History of Present Illness pt presents post fall sustaining R hip fx, now post cannulated hip pinning.      PT Comments    Patient has been up in chair most of morning, ready to return to bed on PT arrival, agreeable to therapy.  Transfers and ambulation limited by pain, and at slow pace with MIN to MOD assist.  Instruction in self LE exercises in bed, patient motivated to get better.    Follow Up Recommendations  SNF     Equipment Recommendations  Rolling walker with 5" wheels;3in1 (PT)    Recommendations for Other Services       Precautions / Restrictions Precautions Precautions: Fall Restrictions Weight Bearing Restrictions: Yes RLE Weight Bearing: Weight bearing as tolerated    Mobility  Bed Mobility Overal bed mobility: Needs Assistance Bed Mobility: Supine to Sit     Supine to sit: Mod assist;+2 for physical assistance     General bed mobility comments: cues for sequencing and safe technique.    Transfers Overall transfer level: Needs assistance Equipment used: Rolling walker (2 wheeled) Transfers: Sit to/from [employer] Sit to Stand: Mod assist Stand pivot transfers: Mod assist       General transfer comment: cues for hand placement.  Ambulation/Gait Ambulation/Gait assistance: Min assist;Mod assist Ambulation Distance (Feet): 10 Feet Assistive device: Rolling walker (2 wheeled) Gait Pattern/deviations: Step-to pattern   Gait velocity interpretation: <1.8 ft/sec, indicative of risk for recurrent falls General Gait Details: slow, antalgic pattern   Stairs            Wheelchair Mobility    Modified Rankin (Stroke Patients Only)       Balance Overall balance assessment: Needs assistance   Sitting balance-Leahy Scale: Fair     Standing balance support: Bilateral upper extremity supported Standing  balance-Leahy Scale: Poor                      Cognition Arousal/Alertness: Awake/alert Behavior During Therapy: WFL for tasks assessed/performed                        Exercises General Exercises - Lower Extremity Ankle Circles/Pumps: AROM;Both Heel Slides: AAROM;Right;10 reps Hip ABduction/ADduction: AAROM;Right;10 reps    General Comments        Pertinent Vitals/Pain Pain 9/10, received pain meds 10-15 minutes prior to PT.    Home Living                      Prior Function            PT Goals (current goals can now be found in the care plan section) Acute Rehab PT Goals Patient Stated Goal: to go to rehab and then home Progress towards PT goals: Progressing toward goals    Frequency  Min 5X/week    PT Plan Current plan remains appropriate    Co-evaluation             End of Session Equipment Utilized During Treatment: Gait belt Activity Tolerance: Patient limited by fatigue;Patient limited by pain Patient left: in bed;with call bell/phone within reach     Time: 1405-1435 PT Time Calculation (min): 30 min  Charges:  $Gait Training: 8-22 mins $Therapeutic Activity: 8-22 mins  G CodesFreida [name]:      [name] 09/21/2013, 3:00 PM

## 2013-09-22 DIAGNOSIS — I2510 Atherosclerotic heart disease of native coronary artery without angina pectoris: Secondary | ICD-10-CM

## 2013-09-22 DIAGNOSIS — R079 Chest pain, unspecified: Secondary | ICD-10-CM

## 2013-09-22 DIAGNOSIS — R7989 Other specified abnormal findings of blood chemistry: Secondary | ICD-10-CM

## 2013-09-22 DIAGNOSIS — R778 Other specified abnormalities of plasma proteins: Secondary | ICD-10-CM | POA: Diagnosis not present

## 2013-09-22 DIAGNOSIS — I2584 Coronary atherosclerosis due to calcified coronary lesion: Secondary | ICD-10-CM

## 2013-09-22 LAB — CBC
HCT: 24.8 % — ABNORMAL LOW (ref 36.0–46.0)
HEMOGLOBIN: 8.4 g/dL — AB (ref 12.0–15.0)
MCH: 29.5 pg (ref 26.0–34.0)
MCHC: 33.9 g/dL (ref 30.0–36.0)
MCV: 87 fL (ref 78.0–100.0)
Platelets: 346 10*3/uL (ref 150–400)
RBC: 2.85 MIL/uL — AB (ref 3.87–5.11)
RDW: 12.3 % (ref 11.5–15.5)
WBC: 10.3 10*3/uL (ref 4.0–10.5)

## 2013-09-22 LAB — TROPONIN I
Troponin I: 0.4 ng/mL (ref <0.30)
Troponin I: <0.3 ng/mL (ref <0.30)

## 2013-09-22 LAB — HEPARIN LEVEL (UNFRACTIONATED)
HEPARIN UNFRACTIONATED: 0.56 [IU]/mL (ref 0.30–0.70)
Heparin Unfractionated: 0.29 IU/mL — ABNORMAL LOW (ref 0.30–0.70)
Heparin Unfractionated: 0.49 IU/mL (ref 0.30–0.70)

## 2013-09-22 LAB — GLUCOSE, CAPILLARY
Glucose-Capillary: 207 mg/dL — ABNORMAL HIGH (ref 70–99)
Glucose-Capillary: 299 mg/dL — ABNORMAL HIGH (ref 70–99)

## 2013-09-22 MED ORDER — ENSURE PUDDING PO PUDG
1.0000 | Freq: Two times a day (BID) | ORAL | Status: DC
  Administered 2013-09-22 – 2013-09-30 (×12): 1 via ORAL

## 2013-09-22 MED ORDER — PREDNISONE 10 MG PO TABS
10.0000 mg | ORAL_TABLET | Freq: Every day | ORAL | Status: DC
  Administered 2013-09-23 – 2013-09-27 (×5): 10 mg via ORAL
  Filled 2013-09-22 (×7): qty 1

## 2013-09-22 MED ORDER — BIOTENE DRY MOUTH MT LIQD
15.0000 mL | Freq: Two times a day (BID) | OROMUCOSAL | Status: DC
  Administered 2013-09-22 – 2013-09-28 (×12): 15 mL via OROMUCOSAL

## 2013-09-22 NOTE — Progress Notes (Signed)
[facility] PROGRESS NOTE  Enid DerryJudy A Losey JWJ:[id number] DOB: 27-Aug-1950 DOA: 09/18/2013 PCP: Fredirick MaudlinHAWKINS,EDWARD L, MD  Assessment/Plan: Principle problem: Hip fracture - Per ortho - PT on board  Active Problems: Elevated troponin - Has trended down and normal limits on latest check - cardiology on board and managing.  Currently suspicion is patient has "angina" that is refractory to medical management per EMR notes.    Diabetes mellitus type 1, uncontrolled, insulin dependent - Carb modifiet - Continue Lantus and SSI    Polymyalgia rheumatica - Was on stress dosing due to soft blood pressures. - Will change back to home regimen    HTN (hypertension) - on amlodipine, carvedilol, and imdur    CAD (coronary artery disease)   - aspirin, plavix  Code Status: full Family Communication: discussed with partner at bedside. Disposition Plan: Once cleared from cardiology and ortho standpoint.   Consultants:  Cardiology  Ortho  Procedures:  Please refer to ortho notes  Antibiotics:  None  HPI/Subjective: Patient's partner at bedside had many complaints. Mainly related to recent changes in cardiac medications. Patient states that if things were done her way the patient would be better off. The patient's partner was also mad that her cardiologist were not rounding on her. I attempted to explain the importance of the operation and the cardiologist thought process based on his last progress note. Despite these attempts the patient's partner was very upset and at times cutting me off during our conversation.  Objective: Filed Vitals:   09/22/13 0912  BP: 152/59  Pulse: 77  Temp: 97.9 F (36.6 C)  Resp: 18    Intake/Output Summary (Last 24 hours) at 09/22/13 1210 Last data filed at 09/22/13 0600  Gross per 24 hour  Intake  455.3 ml  Output   1000 ml  Net -544.7 ml   Filed Weights   09/20/13 0500 09/21/13 0513 09/22/13 0500  Weight: 78.563 kg (173 lb 3.2 oz) 75.705  kg (166 lb 14.4 oz) 78.427 kg (172 lb 14.4 oz)    Exam:   General:  Pt in NAD, alert and awake  Cardiovascular: + S1 and S2, no murmurs  Respiratory: CTA BL, no wheezes  Abdomen: soft, NT, ND  Musculoskeletal: no cyanosis or clubbing   Data Reviewed: Basic Metabolic Panel:  Recent Labs Lab 09/18/13 1825 09/18/13 2221 09/19/13 0349 09/20/13 0642 09/21/13 0544  NA 134*  --  141 136* 137  K 4.4  --  3.7 3.9 4.0  CL 91*  --  101 94* 97  CO2 29  --  25 27 27   GLUCOSE 431*  --  48* 110* 93  BUN 20  --  14 11 15   CREATININE 0.94 0.86 0.80 0.77 0.79  CALCIUM 9.1  --  8.8 8.6 8.6   Liver Function Tests:  Recent Labs Lab 09/18/13 1825 09/19/13 0349  AST 17 17  ALT 18 14  ALKPHOS 96 84  BILITOT <0.2* <0.2*  PROT 7.6 6.7  ALBUMIN 3.5 3.1*   No results found for this basename: LIPASE, AMYLASE,  in the last 168 hours No results found for this basename: AMMONIA,  in the last 168 hours CBC:  Recent Labs Lab 09/18/13 1825 09/18/13 2221 09/19/13 0349 09/20/13 0642 09/21/13 0544 09/22/13 0246  WBC 10.0 10.1 10.5 11.3*  --  10.3  NEUTROABS 6.9  --  5.5  --   --   --   HGB 11.3* 10.7* 10.4* 9.8* 8.5* 8.4*  HCT 33.7* 31.9* 31.0* 29.6* 25.0*  24.8*  MCV 89.9 88.9 88.8 90.2  --  87.0  PLT 364 370 379 335  --  346   Cardiac Enzymes:  Recent Labs Lab 09/21/13 1849 09/22/13 0246 09/22/13 1108  TROPONINI 0.41* 0.40* <0.30   BNP (last 3 results)  Recent Labs  09/18/13 2221  PROBNP 700.3*   CBG:  Recent Labs Lab 09/21/13 1120 09/21/13 1602 09/21/13 1733 09/21/13 2159 09/22/13 0712  GLUCAP 201* 196* 219* 329* 207*    Recent Results (from the past 240 hour(s))  SURGICAL PCR SCREEN     Status: None   Collection Time    09/19/13  9:21 AM      Result Value Ref Range Status   MRSA, PCR NEGATIVE  NEGATIVE Final   Staphylococcus aureus NEGATIVE  NEGATIVE Final   Comment:            The Xpert SA Assay (FDA     approved for NASAL specimens     in  patients over 62 years of age),     is one component of     a comprehensive surveillance     program.  Test performance has     been validated by [employer] for patients greater     than or equal to 58 year old.     It is not intended     to diagnose infection nor to     guide or monitor treatment.     Studies: No results found.  Scheduled Meds: . amitriptyline  100 mg Oral QHS  . amLODipine  2.5 mg Oral Daily  . aspirin EC  162 mg Oral BID  . calcium-vitamin D  1 tablet Oral Daily  . carvedilol  3.125 mg Oral BID WC  . clopidogrel  75 mg Oral Daily  . feeding supplement (ENSURE)  1 Container Oral BID BM  . fentaNYL  25 mcg Transdermal Q72H  . folic acid  0.5 mg Oral Daily  . furosemide  40 mg Oral Daily  . hydrocortisone sod succinate (SOLU-CORTEF) inj  100 mg Intravenous Q8H  . insulin aspart  0-5 Units Subcutaneous QHS  . insulin aspart  0-9 Units Subcutaneous TID WC  . insulin glargine  25 Units Subcutaneous BID  . iron polysaccharides  150 mg Oral Daily  . isosorbide mononitrate  30 mg Oral Daily  . latanoprost  1 drop Both Eyes QHS  . pantoprazole  40 mg Oral QAC breakfast  . potassium chloride SA  30 mEq Oral Daily  . sertraline  50 mg Oral Daily  . sodium chloride  3 mL Intravenous Q12H  . timolol  1 drop Both Eyes Daily   Continuous Infusions: . heparin 900 Units/hr (09/21/13 2140)  . nitroGLYCERIN 10 mcg/min (09/22/13 0409)    Time spent: > 35 minutes    Penny Pia  [facility] Pager [phone] If 7PM-7AM, please contact night-coverage at [email or web address], password [facility] 09/22/2013, 12:10 PM  LOS: 4 days

## 2013-09-22 NOTE — Progress Notes (Signed)
ANTICOAGULATION CONSULT NOTE - Follow Up Consult  Pharmacy Consult for Heparin  Indication: chest pain/ACS  Allergies  Allergen Reactions  . Codeine     Unknown  . Flagyl [Metronidazole]     Unknown  . Fluocinonide     Unknown  . Ranexa [Ranolazine] Other (See Comments)    Elevated liver function  . Statins Other (See Comments)    Muscle aches  . Biaxin [Clarithromycin] Rash  . Demerol [Meperidine] Rash    Patient Measurements: Height: 5\' 4"  (162.6 cm) Weight: 166 lb 14.4 oz (75.705 kg) IBW/kg (Calculated) : 54.7  Vital Signs: Temp: 97.5 F (36.4 C) (07/11 2300) Temp src: Oral (07/11 2300) BP: 140/61 mmHg (07/11 2300) Pulse Rate: 71 (07/11 2300)  Labs:  Recent Labs  09/19/13 0349 09/20/13 0642 09/21/13 0544 09/21/13 1849 09/22/13 0246  HGB 10.4* 9.8* 8.5*  --  8.4*  HCT 31.0* 29.6* 25.0*  --  24.8*  PLT 379 335  --   --  346  HEPARINUNFRC  --   --   --   --  0.56  CREATININE 0.80 0.77 0.79  --   --   TROPONINI  --   --   --  0.41* 0.40*    Estimated Creatinine Clearance: 73.6 ml/min (by C-G formula based on Cr of 0.79).   Medications:  Heparin 900 units/hr  Assessment: 63 y/o F on heparin for elevated troponin. HL is 0.56. Other labs as above.   Goal of Therapy:  Heparin level 0.3-0.7 units/ml Monitor platelets by anticoagulation protocol: Yes   Plan:  -Continue heparin at 900 units/hr -1100 HL -Daily CBC/HL -Monitor for bleeding  Abran DukeLedford, Sherrol Vicars 09/22/2013,3:45 AM

## 2013-09-22 NOTE — Progress Notes (Signed)
Patient ID: Erika DerryJudy A Baldassari, female   DOB: 11/16/1950, 63 y.o.   MRN: [id number]  Primary CArdiologist:  [place]alyHAD Hilty   Subjective:  Asked to acutely evaluate patient with chest pain post right hip surgery.  She has a history of distant CABG  10/14 had stent to SVG D1 with Patent OM graft , patent LIMA and known occluded graft to RCA She has chronic pain and frequent falls.  Post right hip surgery  Seen by fellow last night for pain  Troponin only  .4 and no acute ECG changes.  She feels some better after ginger ale  She has had some Response to nitro and is on a drip.  Review of chart indicates chronic pain syndrome "angina" refractory to medical management and polymalgia.  She is currently pain free after my evaluation  Objective:  Filed Vitals:   09/22/13 0400 09/22/13 0500 09/22/13 0523 09/22/13 0700  BP: 160/55  146/55 148/66  Pulse: 70  63   Temp:   97.9 F (36.6 C)   TempSrc:   Oral   Resp: 20  16   Height:      Weight:  172 lb 14.4 oz (78.427 kg)    SpO2: 100%  99%     Intake/Output from previous day:  Intake/Output Summary (Last 24 hours) at 09/22/13 0827 Last data filed at 09/22/13 0600  Gross per 24 hour  Intake  455.3 ml  Output   1325 ml  Net -869.7 ml    Physical Exam: Affect appropriate Chronically ill white female  HEENT: Hard of hearing  Neck supple with no adenopathy JVP normal no bruits no thyromegaly Lungs basilar crackles no wheezing and good diaphragmatic motion Heart:  S1/S2 no murmur, no rub, gallop or click PMI normal Abdomen: benighn, BS positve, no tenderness, no AAA no bruit.  No HSM or HJR Distal pulses intact with no bruits No edema Neuro non-focal Skin warm and dry Right hip surgery    Lab Results: Basic Metabolic Panel:  Recent Labs  40/98/1105/12/26 0642 09/21/13 0544  NA 136* 137  K 3.9 4.0  CL 94* 97  CO2 27 27  GLUCOSE 110* 93  BUN 11 15  CREATININE 0.77 0.79  CALCIUM 8.6 8.6   CBC:  Recent Labs  09/20/13 0642 09/21/13 0544  09/22/13 0246  WBC 11.3*  --  10.3  HGB 9.8* 8.5* 8.4*  HCT 29.6* 25.0* 24.8*  MCV 90.2  --  87.0  PLT 335  --  346   Cardiac Enzymes:  Recent Labs  09/21/13 1849 09/22/13 0246  TROPONINI 0.41* 0.40*    Imaging: No results found.  Cardiac Studies:  ECG:  Reviewed 3 ECG;s from 7/11 and 7/12  SR no acute changes  LVH     Telemetry:  NSR no VT   Echo:   Medications:   . amitriptyline  100 mg Oral QHS  . amLODipine  2.5 mg Oral Daily  . aspirin EC  162 mg Oral BID  . calcium-vitamin D  1 tablet Oral Daily  . carvedilol  3.125 mg Oral BID WC  . clopidogrel  75 mg Oral Daily  . fentaNYL  25 mcg Transdermal Q72H  . folic acid  0.5 mg Oral Daily  . furosemide  40 mg Oral Daily  . hydrocortisone sod succinate (SOLU-CORTEF) inj  100 mg Intravenous Q8H  . insulin aspart  0-5 Units Subcutaneous QHS  . insulin aspart  0-9 Units Subcutaneous TID WC  . insulin glargine  25  Units Subcutaneous BID  . iron polysaccharides  150 mg Oral Daily  . isosorbide mononitrate  30 mg Oral Daily  . latanoprost  1 drop Both Eyes QHS  . pantoprazole  40 mg Oral QAC breakfast  . potassium chloride SA  30 mEq Oral Daily  . sertraline  50 mg Oral Daily  . sodium chloride  3 mL Intravenous Q12H  . timolol  1 drop Both Eyes Daily     . heparin 900 Units/hr (09/21/13 2140)  . nitroGLYCERIN 10 mcg/min (09/22/13 0409)    Assessment/Plan:  Chest Pain:  I don't think there is an acute coronary syndrome here.  She is comfortable now with long standing history of chronic pain  Hemodynamics fine Ok to go to 3S as she is on iv nitro for now.  Continue to follow troponin and ECG  Lasix x 1 for basilar rales and check CXR  I/O's are negative  Would like To avoid repeat cath right after hip surgery if possible  Continue GI coverage  Sats ok w/u DVT per primary service   Time spent with patient 30 minutes   Charlton Haws 09/22/2013, 8:27 AM

## 2013-09-22 NOTE — Progress Notes (Signed)
0400, pt got up to bedside commode with 2 assist.  Once returning to bed, pt complained of pain going down bilateral arms.  She denied chest pain, VSS.  IV ntg titrated to .  EKG obtained and Cards MD notified.  Will continue to monitor.
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
## 2013-09-22 NOTE — Progress Notes (Signed)
Pt transferred to 3300, report called to Kohl'sCarol RN.

## 2013-09-22 NOTE — Progress Notes (Addendum)
ANTICOAGULATION CONSULT NOTE - Follow Up Consult  Pharmacy Consult for Heparin  Indication: chest pain/ACS  Allergies  Allergen Reactions  . Codeine     Unknown  . Flagyl [Metronidazole]     Unknown  . Fluocinonide     Unknown  . Ranexa [Ranolazine] Other (See Comments)    Elevated liver function  . Statins Other (See Comments)    Muscle aches  . Biaxin [Clarithromycin] Rash  . Demerol [Meperidine] Rash    Patient Measurements: Height: 5\' 4"  (162.6 cm) Weight: 172 lb 14.4 oz (78.427 kg) IBW/kg (Calculated) : 54.7  Vital Signs: Temp: 97.4 F (36.3 C) (07/12 1255) Temp src: Oral (07/12 1255) BP: 146/63 mmHg (07/12 1300) Pulse Rate: 66 (07/12 1300)  Labs:  Recent Labs  09/20/13 0642 09/21/13 0544 09/21/13 1849 09/22/13 0246 09/22/13 1100 09/22/13 1108  HGB 9.8* 8.5*  --  8.4*  --   --   HCT 29.6* 25.0*  --  24.8*  --   --   PLT 335  --   --  346  --   --   HEPARINUNFRC  --   --   --  0.56 0.29*  --   CREATININE 0.77 0.79  --   --   --   --   TROPONINI  --   --  0.41* 0.40*  --  <0.30    Estimated Creatinine Clearance: 74.8 ml/min (by C-G formula based on Cr of 0.79).   Medications:  Heparin 900 units/hr  Assessment: 89 YOF who presented with intense pain and tingling down both arms. Currently on heparin for suspicion of angina refractory to medical management, troponin trending down to <0.3 today. HL today 0.29 with goal 0.3-0.7.    Goal of Therapy:  Heparin level 0.3-0.7 units/ml Monitor platelets by anticoagulation protocol: Yes   Plan:  -Increased heparin to 1050 units/hr -2000 HL -Daily CBC/HL -Monitor for bleeding  Megan E. Supple, Pharm.D Clinical Pharmacy Resident Pager: [phone] 09/22/2013 1:55 PM   ADDENDUM Heparin level increased to therapeutic range at 0.49 units/mL. No bleeding noted.  Plan: 1. Continue heparin at 1050 units/hr 2. Daily heparin level and CBC 3. Follow for s/s bleeding  Antwyne Pingree D. Shatira Dobosz, PharmD,  BCPS Clinical Pharmacist Pager: [phone] 09/22/2013 8:44 PM

## 2013-09-22 NOTE — Progress Notes (Signed)
PT Cancellation Note  Patient Details Name: [name] MRN: [id number] DOB: 1950-09-02   Cancelled Treatment:    Reason Eval/Treat Not Completed: Medical issues which prohibited therapy Noted that pt is symptomatic when getting out of bed with nursing this AM and scheduled for possible surgical procedure Monday. Will follow up after procedure.  [name], [place] CarolinaPT [phone]   [place], [name] 09/22/2013, 8:02 AM
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
## 2013-09-23 LAB — GLUCOSE, CAPILLARY
GLUCOSE-CAPILLARY: 119 mg/dL — AB (ref 70–99)
GLUCOSE-CAPILLARY: 285 mg/dL — AB (ref 70–99)
GLUCOSE-CAPILLARY: 130 mg/dL — AB (ref 70–99)
GLUCOSE-CAPILLARY: 185 mg/dL — AB (ref 70–99)
GLUCOSE-CAPILLARY: 37 mg/dL — AB (ref 70–99)
GLUCOSE-CAPILLARY: 90 mg/dL (ref 70–99)
GLUCOSE-CAPILLARY: 131 mg/dL — AB (ref 70–99)

## 2013-09-23 LAB — FERRITIN: FERRITIN: 108 ng/mL (ref 10–291)

## 2013-09-23 LAB — CBC
HEMATOCRIT: 24.4 % — AB (ref 36.0–46.0)
Hemoglobin: 8.4 g/dL — ABNORMAL LOW (ref 12.0–15.0)
MCH: 30.1 pg (ref 26.0–34.0)
MCHC: 34.4 g/dL (ref 30.0–36.0)
MCV: 87.5 fL (ref 78.0–100.0)
Platelets: 395 10*3/uL (ref 150–400)
RBC: 2.79 MIL/uL — ABNORMAL LOW (ref 3.87–5.11)
RDW: 12.4 % (ref 11.5–15.5)
WBC: 12 10*3/uL — ABNORMAL HIGH (ref 4.0–10.5)

## 2013-09-23 LAB — IRON AND TIBC
Iron: 77 ug/dL (ref 42–135)
Saturation Ratios: 35 % (ref 20–55)
TIBC: 223 ug/dL — AB (ref 250–470)
UIBC: 146 ug/dL (ref 125–400)

## 2013-09-23 LAB — FOLATE: Folate: >20 ng/mL

## 2013-09-23 LAB — RETICULOCYTES
RBC.: 2.82 MIL/uL — AB (ref 3.87–5.11)
RETIC COUNT ABSOLUTE: 81.8 10*3/uL (ref 19.0–186.0)
RETIC CT PCT: 2.9 % (ref 0.4–3.1)

## 2013-09-23 LAB — HEPARIN LEVEL (UNFRACTIONATED): Heparin Unfractionated: 0.43 IU/mL (ref 0.30–0.70)

## 2013-09-23 LAB — VITAMIN B12: VITAMIN B 12: 1463 pg/mL — AB (ref 211–911)

## 2013-09-23 MED ORDER — INSULIN GLARGINE 100 UNIT/ML ~~LOC~~ SOLN
8.0000 [IU] | Freq: Every day | SUBCUTANEOUS | Status: DC
  Filled 2013-09-23: qty 0.08

## 2013-09-23 MED ORDER — DOCUSATE SODIUM 100 MG PO CAPS: 100.0000 mg | ORAL_CAPSULE | Freq: Two times a day (BID) | ORAL | Status: AC

## 2013-09-23 MED ORDER — DEXTROSE 50 % IV SOLN
INTRAVENOUS | Status: AC
  Filled 2013-09-23: qty 50

## 2013-09-23 MED ORDER — CLOPIDOGREL BISULFATE 75 MG PO TABS: 75.0000 mg | ORAL_TABLET | Freq: Every day | ORAL | Status: DC

## 2013-09-23 MED ORDER — ASPIRIN 162 MG PO TBEC: 162.0000 mg | DELAYED_RELEASE_TABLET | Freq: Two times a day (BID) | ORAL | Status: DC

## 2013-09-23 MED ORDER — DEXTROSE 50 % IV SOLN: 50.0000 mL | Freq: Once | INTRAVENOUS | Status: AC | PRN

## 2013-09-23 MED ORDER — INSULIN GLARGINE 100 UNIT/ML ~~LOC~~ SOLN
25.0000 [IU] | Freq: Every day | SUBCUTANEOUS | Status: DC
  Filled 2013-09-23: qty 0.25

## 2013-09-23 MED ORDER — HYDROCODONE-ACETAMINOPHEN 5-325 MG PO TABS: 1.0000 | ORAL_TABLET | ORAL | Status: DC | PRN

## 2013-09-23 MED ORDER — CARVEDILOL 6.25 MG PO TABS
6.2500 mg | ORAL_TABLET | Freq: Two times a day (BID) | ORAL | Status: DC
  Administered 2013-09-23 – 2013-09-25 (×4): 6.25 mg via ORAL
  Filled 2013-09-23 (×8): qty 1

## 2013-09-23 MED ORDER — ONDANSETRON HCL 4 MG/2ML IJ SOLN
4.0000 mg | Freq: Four times a day (QID) | INTRAMUSCULAR | Status: DC | PRN
  Administered 2013-09-23: 4 mg via INTRAVENOUS
  Filled 2013-09-23: qty 2

## 2013-09-23 MED ORDER — GLUCOSE 40 % PO GEL
ORAL | Status: AC
  Filled 2013-09-23: qty 1

## 2013-09-23 MED ORDER — INSULIN ASPART 100 UNIT/ML ~~LOC~~ SOLN
0.0000 [IU] | Freq: Three times a day (TID) | SUBCUTANEOUS | Status: DC
  Administered 2013-09-24: 3 [IU] via SUBCUTANEOUS
  Administered 2013-09-24: 11 [IU] via SUBCUTANEOUS
  Administered 2013-09-25: 8 [IU] via SUBCUTANEOUS
  Administered 2013-09-25: 11 [IU] via SUBCUTANEOUS

## 2013-09-23 MED ORDER — HEPARIN SODIUM (PORCINE) 5000 UNIT/ML IJ SOLN
5000.0000 [IU] | Freq: Three times a day (TID) | INTRAMUSCULAR | Status: DC
  Administered 2013-09-23 – 2013-09-25 (×5): 5000 [IU] via SUBCUTANEOUS
  Filled 2013-09-23 (×7): qty 1

## 2013-09-23 MED ORDER — ISOSORBIDE MONONITRATE ER 60 MG PO TB24
60.0000 mg | ORAL_TABLET | Freq: Every day | ORAL | Status: DC
  Administered 2013-09-24 – 2013-09-25 (×2): 60 mg via ORAL
  Filled 2013-09-23 (×2): qty 1

## 2013-09-23 NOTE — Progress Notes (Signed)
ANTICOAGULATION CONSULT NOTE - Follow Up Consult  Pharmacy Consult for Heparin  Indication: chest pain/ACS  Allergies  Allergen Reactions  . Codeine     Unknown  . Flagyl [Metronidazole]     Unknown  . Fluocinonide     Unknown  . Ranexa [Ranolazine] Other (See Comments)    Elevated liver function  . Statins Other (See Comments)    Muscle aches  . Biaxin [Clarithromycin] Rash  . Demerol [Meperidine] Rash    Patient Measurements: Height: 5\' 4"  (162.6 cm) Weight: 173 lb 1 oz (78.5 kg) IBW/kg (Calculated) : 54.7  Vital Signs: Temp: 97.8 F (36.6 C) (07/13 0318) Temp src: Oral (07/13 0318) BP: 156/63 mmHg (07/13 0318) Pulse Rate: 68 (07/13 0318)  Labs:  Recent Labs  09/21/13 0544 09/21/13 1849  09/22/13 0246 09/22/13 1100 09/22/13 1108 09/22/13 1930 09/23/13 0414  HGB 8.5*  --   --  8.4*  --   --   --  8.4*  HCT 25.0*  --   --  24.8*  --   --   --  24.4*  PLT  --   --   --  346  --   --   --  395  HEPARINUNFRC  --   --   < > 0.56 0.29*  --  0.49 0.43  CREATININE 0.79  --   --   --   --   --   --   --   TROPONINI  --  0.41*  --  0.40*  --  <0.30  --   --   < > = values in this interval not displayed.  Estimated Creatinine Clearance: 74.8 ml/min (by C-G formula based on Cr of 0.79).   Medications:  Heparin 1050 units/hr  Assessment: [age] YOF who presented with intense pain and tingling down both arms. Currently on heparin for suspicion of angina refractory to medical management, troponin now down to <0.3. HL therapeutic today 0.43.  No bleeding noted.  H&H low but stable, Plt WNL.    Goal of Therapy:  Heparin level 0.3-0.7 units/ml Monitor platelets by anticoagulation protocol: Yes   Plan:  -Continue heparin at 1050 units/hr -Daily CBC/HL -Monitor for s/sx bleeding -F/u intended LOT for heparin  [name], PharmD Clinical Pharmacy Resident Pager: [phone] 09/23/2013 10:57 AM
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
## 2013-09-23 NOTE — Progress Notes (Signed)
[facility] PROGRESS NOTE  Enid DerryJudy A Bray UJW:[id number] DOB: Jul 25, 1959 DOA: 09/18/2013 PCP: Fredirick MaudlinHAWKINS,EDWARD L, MD  Assessment/Plan: Principle problem: Hip fracture - Per ortho - PT on board  Active Problems: Elevated troponin - Has trended down and normal limits on latest check - Cardiology on board and managing.  Currently suspicion is patient has "angina" that is refractory to medical management per EMR notes.    Diabetes mellitus type 1, uncontrolled, insulin dependent - Carb modifiet - Change patient to Lantus home dose per minute discussion with diabetic coordinator and SSI moderate scale per recommendations from recommendations from diabetic coordinator, starting tomorrow - Given episode of hypoglycemia we'll continue to monitor and step down unit    Polymyalgia rheumatica - Was on stress dosing due to soft blood pressures. - Will change back to home steroid regimen    HTN (hypertension) - on amlodipine, carvedilol, and imdur    CAD (coronary artery disease)   - aspirin, plavix  Code Status: full Family Communication: discussed with partner at bedside. Disposition Plan: Once cleared from cardiology and ortho standpoint.   Consultants:  Cardiology  Ortho  Procedures:  Please refer to ortho notes  Antibiotics:  None  HPI/Subjective: No new complaints reported. Diabetic coordinator page me and reported hypoglycemic event. After evaluation the patient's home insulin regimen she was transition back to her home Lantus regimen.  Objective: Filed Vitals:   09/23/13 1159  BP: 128/42  Pulse: 66  Temp: 98.6 F (37 C)  Resp: 19    Intake/Output Summary (Last 24 hours) at 09/23/13 1552 Last data filed at 09/23/13 1400  Gross per 24 hour  Intake    420 ml  Output   1350 ml  Net   -930 ml   Filed Weights   09/22/13 0500 09/22/13 0908 09/23/13 0318  Weight: 78.427 kg (172 lb 14.4 oz) 78.3 kg (172 lb 9.9 oz) 78.5 kg (173 lb 1 oz)     Exam:   General:  Pt in NAD, alert and awake  Cardiovascular: + S1 and S2, no murmurs  Respiratory: CTA BL, no wheezes  Abdomen: soft, NT, ND  Musculoskeletal: no cyanosis or clubbing   Data Reviewed: Basic Metabolic Panel:  Recent Labs Lab 09/18/13 1825 09/18/13 2221 09/19/13 0349 09/20/13 0642 09/21/13 0544  NA 134*  --  141 136* 137  K 4.4  --  3.7 3.9 4.0  CL 91*  --  101 94* 97  CO2 29  --  25 27 27   GLUCOSE 431*  --  48* 110* 93  BUN 20  --  14 11 15   CREATININE 0.94 0.86 0.80 0.77 0.79  CALCIUM 9.1  --  8.8 8.6 8.6   Liver Function Tests:  Recent Labs Lab 09/18/13 1825 09/19/13 0349  AST 17 17  ALT 18 14  ALKPHOS 96 84  BILITOT <0.2* <0.2*  PROT 7.6 6.7  ALBUMIN 3.5 3.1*   No results found for this basename: LIPASE, AMYLASE,  in the last 168 hours No results found for this basename: AMMONIA,  in the last 168 hours CBC:  Recent Labs Lab 09/18/13 1825 09/18/13 2221 09/19/13 0349 09/20/13 0642 09/21/13 0544 09/22/13 0246 09/23/13 0414  WBC 10.0 10.1 10.5 11.3*  --  10.3 12.0*  NEUTROABS 6.9  --  5.5  --   --   --   --   HGB 11.3* 10.7* 10.4* 9.8* 8.5* 8.4* 8.4*  HCT 33.7* 31.9* 31.0* 29.6* 25.0* 24.8* 24.4*  MCV 89.9 88.9 88.8 90.2  --  87.0 87.5  PLT 364 370 379 335  --  346 395   Cardiac Enzymes:  Recent Labs Lab 09/21/13 1849 09/22/13 0246 09/22/13 1108  TROPONINI 0.41* 0.40* <0.30   BNP (last 3 results)  Recent Labs  09/18/13 2221  PROBNP 700.3*   CBG:  Recent Labs Lab 09/22/13 1654 09/22/13 2143 09/23/13 1155 09/23/13 1350 09/23/13 1428  GLUCAP 119* 285* 130* 37* 185*    Recent Results (from the past 240 hour(s))  SURGICAL PCR SCREEN     Status: None   Collection Time    09/19/13  9:21 AM      Result Value Ref Range Status   MRSA, PCR NEGATIVE  NEGATIVE Final   Staphylococcus aureus NEGATIVE  NEGATIVE Final   Comment:            The Xpert SA Assay (FDA     approved for NASAL specimens     in patients  over 30 years of age),     is one component of     a comprehensive surveillance     program.  Test performance has     been validated by [employer] for patients greater     than or equal to 16 year old.     It is not intended     to diagnose infection nor to     guide or monitor treatment.     Studies: No results found.  Scheduled Meds: . amitriptyline  100 mg Oral QHS  . amLODipine  2.5 mg Oral Daily  . antiseptic oral rinse  15 mL Mouth Rinse BID  . aspirin EC  162 mg Oral BID  . calcium-vitamin D  1 tablet Oral Daily  . carvedilol  6.25 mg Oral BID WC  . clopidogrel  75 mg Oral Daily  . feeding supplement (ENSURE)  1 Container Oral BID BM  . fentaNYL  25 mcg Transdermal Q72H  . folic acid  0.5 mg Oral Daily  . furosemide  40 mg Oral Daily  . heparin subcutaneous  5,000 Units Subcutaneous 3 times per day  . [START ON 09/24/2013] insulin aspart  0-15 Units Subcutaneous TID WC  . [START ON 09/24/2013] insulin glargine  25 Units Subcutaneous Daily  . [START ON 09/24/2013] insulin glargine  8 Units Subcutaneous QHS  . iron polysaccharides  150 mg Oral Daily  . [START ON 09/24/2013] isosorbide mononitrate  60 mg Oral Daily  . latanoprost  1 drop Both Eyes QHS  . pantoprazole  40 mg Oral QAC breakfast  . potassium chloride SA  30 mEq Oral Daily  . predniSONE  10 mg Oral Q breakfast  . sertraline  50 mg Oral Daily  . sodium chloride  3 mL Intravenous Q12H  . timolol  1 drop Both Eyes Daily   Continuous Infusions:    Time spent: > 35 minutes    Penny Pia  [facility] Pager [phone] If 7PM-7AM, please contact night-coverage at [email or web address], password [facility] 09/23/2013, 3:52 PM  LOS: 5 days

## 2013-09-23 NOTE — Clinical Social Work Note (Signed)
CSW met with patient at bedside and explained that [facility] has stated that they can offer patient a bed.   Liz Beach MSW, [place], [place], [phone]

## 2013-09-23 NOTE — Clinical Social Work Note (Signed)
CSW left message with [facility] admission coordinator in attempt to confirm that patient does have bed with facility (per family's request). CSW is waiting for callback.   Roddie McBryant Adeli Frost MSW, [place]CSWA, [place], [phone]

## 2013-09-23 NOTE — Progress Notes (Addendum)
Hypoglycemic Event  CBG: 37  Treatment: D50 IV 50 mL  Symptoms: Sweaty and Hungry  Follow-up CBG: Time:1428 CBG Result:185  Possible Reasons for Event: Medication regimen: lantus dose adjusted  Comments/MD notified:MD Brackbill    [place], [place]  Remember to initiate Hypoglycemia Order Set & complete

## 2013-09-23 NOTE — Progress Notes (Addendum)
Inpatient Diabetes Program Recommendations  AACE/ADA: New Consensus Statement on Inpatient Glycemic Control (2013)  Target Ranges:  Prepandial:   less than 140 mg/dL      Peak postprandial:   less than 180 mg/dL (1-2 hours)      Critically ill patients:  140 - 180 mg/dL   Reason for Assessment: Sub-optimal glycemic control  Diabetes history: Type 1 diabetes per problem list  Outpatient Diabetes medications: Lantus 25 units bid, Humalog 5 to 12 units tid with meals per Med Rec.  Talked with patient who says she takes Lantus 25 every morning around 7-8 am and Lantus 8 units every evening around 6 pm  Current orders for Inpatient glycemic control: Lantus 25 units bid, Novolog sensitive correction tid  Note: Patient's CBG's have generally been too high despite Lantus 25 units bid and Sensitive correction.  Today CBG dropped to 37 mg/dl after receiving 1 unit correction at lunch.  Morning CBG unknown.  Did not receive correction at breakfast.    Patient states she takes Lantus as noted above-- 25 units every am around 7-8 am and 8 units every pm around 6 pm.  Her Humalog is on a sliding scale.  If CBG is 150 mg to 200 mg takes 5 units.  If up to 300 mg, takes 10 units.  If between 200 and 300, takes 6 or 7 units.  Would suggest the following:  Lantus dosage be decreased closer to her home dose and be given closer to home administration times.    May not need anymore Lantus today-- make change starting tomorrow.     Beginning tomorrow, would suggest correction scale be changed to moderate scale.    This would be closer to her home regimen.    Have notified pharmacy and home dose of Lantus has been changed in the Med Rec.   Thank you.  Alexander Mcauley S. Elsie [place]outh, RN, CNS, CDE Inpatient Diabetes Program, team pager [phone]        Addendum:  Partner states patient sees Dr. Evlyn KannerSouth regarding her diabetes about every 3 to 4 months, Dr. Ollen BowlHarkins every 3 to 4 months, and her cardiologist about every 4 months.   Thank you.  Marisol Glazer S. Elsie [place], RN, CNS, CDE Inpatient Diabetes Program, team pager [phone]
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
## 2013-09-24 ENCOUNTER — Telehealth: Payer: Self-pay | Admitting: Internal Medicine

## 2013-09-24 DIAGNOSIS — F419 Anxiety disorder, unspecified: Secondary | ICD-10-CM | POA: Diagnosis present

## 2013-09-24 DIAGNOSIS — I214 Non-ST elevation (NSTEMI) myocardial infarction: Secondary | ICD-10-CM

## 2013-09-24 LAB — GLUCOSE, CAPILLARY
GLUCOSE-CAPILLARY: 222 mg/dL — AB (ref 70–99)
GLUCOSE-CAPILLARY: 158 mg/dL — AB (ref 70–99)
GLUCOSE-CAPILLARY: 341 mg/dL — AB (ref 70–99)
GLUCOSE-CAPILLARY: 186 mg/dL — AB (ref 70–99)
Glucose-Capillary: 105 mg/dL — ABNORMAL HIGH (ref 70–99)
Glucose-Capillary: 184 mg/dL — ABNORMAL HIGH (ref 70–99)
Glucose-Capillary: 41 mg/dL — CL (ref 70–99)

## 2013-09-24 LAB — CBC
HCT: 28 % — ABNORMAL LOW (ref 36.0–46.0)
Hemoglobin: 9.5 g/dL — ABNORMAL LOW (ref 12.0–15.0)
MCH: 30.6 pg (ref 26.0–34.0)
MCHC: 33.9 g/dL (ref 30.0–36.0)
MCV: 90.3 fL (ref 78.0–100.0)
Platelets: 427 10*3/uL — ABNORMAL HIGH (ref 150–400)
RBC: 3.1 MIL/uL — AB (ref 3.87–5.11)
RDW: 13 % (ref 11.5–15.5)
WBC: 9.6 10*3/uL (ref 4.0–10.5)

## 2013-09-24 MED ORDER — INSULIN GLARGINE 100 UNIT/ML ~~LOC~~ SOLN
20.0000 [IU] | Freq: Every day | SUBCUTANEOUS | Status: DC
  Filled 2013-09-24 (×2): qty 0.2

## 2013-09-24 MED ORDER — INSULIN GLARGINE 100 UNIT/ML ~~LOC~~ SOLN: 20.0000 [IU] | Freq: Every day | SUBCUTANEOUS | Status: DC

## 2013-09-24 NOTE — Progress Notes (Signed)
    Subjective:  No chest pain this am. Anxious about her blood sugars  Objective:  Vital Signs in the last 24 hours: Temp:  [97.9 F (36.6 C)-98.4 F (36.9 C)] 98 F (36.7 C) (07/14 0821) Pulse Rate:  [70-77] 77 (07/14 1057) Resp:  [14-24] 15 (07/14 0821) BP: (109-163)/(41-107) 163/55 mmHg (07/14 0821) SpO2:  [92 %-100 %] 97 % (07/14 0821) Weight:  [172 lb 13.5 oz (78.4 kg)] 172 lb 13.5 oz (78.4 kg) (07/14 0500)  Intake/Output from previous day:  Intake/Output Summary (Last 24 hours) at 09/24/13 1205 Last data filed at 09/24/13 [id number]  Gross per 24 hour  Intake 1045.5 ml  Output   3250 ml  Net -2204.5 ml    Physical Exam: General appearance: alert, cooperative, no distress and anxious Lungs: clear to auscultation bilaterally Heart: regular rate and rhythm   Rate: 78  Rhythm: normal sinus rhythm  Lab Results:  Recent Labs  09/23/13 0414 09/24/13 0340  WBC 12.0* 9.6  HGB 8.4* 9.5*  PLT 395 427*   No results found for this basename: NA, K, CL, CO2, GLUCOSE, BUN, CREATININE,  in the last 72 hours  Recent Labs  09/22/13 0246 09/22/13 1108  TROPONINI 0.40* <0.30   No results found for this basename: INR,  in the last 72 hours  Imaging: Imaging results have been reviewed   Assessment/Plan:  Former pt of Dr. Alanda AmassWeintraub, who has an extensive cardiac history significant for CAD, s/p CABG in 1995 with a LIMA to LAD, saphenous vein graft to OM1, saphenous vein graft to a diagonal, and saphenous vein graft to the right coronary artery, multiple PCis since. Her last PCI was a DES to a high-grade diagonal branch SVG lesion in Oct 2014. She fell and broke her hip 09/18/13 and had surgery 09/19/13. Post op she had chest pain with a slightly elevated Troponin 0.41. Plan is for medical Rx.     Principal Problem:   Hip fracture Active Problems:   Diabetes mellitus type 1, uncontrolled, insulin dependent   CAD- multiple PCIs   Anxiety   Polymyalgia rheumatica   HTN  (hypertension)   Hearing loss sensory, bilateral   Elevated troponin- 0.41 post op hip    PLAN: I will arrange follow up with Dr Rennis GoldenHilty. She will being going to rehab once her diabetes is under better control.   Corine ShelterLuke Kilroy PA-C Beeper [phone] 09/24/2013, 12:05 PM  Agree with above assessment. Patient remains very anxious. She is impatient to be moved to Rehab. Continue current cardiac regimen.

## 2013-09-24 NOTE — Progress Notes (Signed)
Per patient's request her partner [name] was called regarding patient's transfer to [phone]. Voice message left.  Will continue to monitor. Asher Muir- Courney Garrod,RN

## 2013-09-24 NOTE — Progress Notes (Signed)
Per patient request patient was given PRN doses of Xanax and Norco. Patient stated she felt nervous about getting transferred. I stayed with the patient to offer my support while she discussed some of her feelings. Pt stated she felt better talking with me and she was glad she was staying on this unit for a little while longer.  Will continue to monitor. Asher Muir- Arville Postlewaite,RN

## 2013-09-24 NOTE — Progress Notes (Signed)
[facility] PROGRESS NOTE  GIOVANNA KEMMERER ZOX:[id number] DOB: 09-01-1950 DOA: 09/18/2013 PCP: Fredirick Maudlin, MD  Assessment/Plan: Principle problem: Hip fracture - Per ortho - PT on board - Plans are to transfer to SNF once medically stable.  Active Problems: Elevated troponin - Has trended down and normal limits on latest check - Cardiology on board and managing.  Currently suspicion is patient has "angina" that is refractory to medical management per EMR notes.    Diabetes mellitus type 1, uncontrolled, insulin dependent - Carb modified diet - Patient is now saying that when she has low blood sugars she does not take her evening Lantus dose and she is also requesting we decrease her morning Lantus dose to 20 units SQ q day.  Given her low blood sugar of 34 yesterday I think it is reasonable to decrease her Lantus dose to 20 units q day.  Suspect with recent decrease in steroid dose her blood sugars are starting to come down    Polymyalgia rheumatica - Was on stress dosing due to soft blood pressures. - Currently back to home steroid regimen    HTN (hypertension) - on amlodipine, carvedilol, and imdur  Chronic systolic HF - Defer management to Cardiology team.    CAD (coronary artery disease)   - aspirin, plavix  Code Status: full Family Communication: discussed with partner at bedside. Disposition Plan: Transferred to med surg floor. Discontinuing cardiac monitor given no reports of red flags for more than 24 hrs and normal troponin levels.   Consultants:  Cardiology  Ortho  Procedures:  Please refer to ortho notes  Antibiotics:  None  HPI/Subjective: No new complaints reported. Patient would like to have her Lantus dose decreased further.  Despite the fact that she told diabetic coordinator she takes Lantus 8 units in the evening currently she is telling me that she does not do this when she has low blood sugars and that she would like to take Lantus only  once per day.  Per nursing patient refused Lantus 25 units sq this am.  Objective: Filed Vitals:   09/24/13 0821  BP: 163/55  Pulse: 70  Temp: 98 F (36.7 C)  Resp: 15    Intake/Output Summary (Last 24 hours) at 09/24/13 1025 Last data filed at 09/24/13 8119  Gross per 24 hour  Intake 1065.5 ml  Output   3250 ml  Net -2184.5 ml   Filed Weights   09/22/13 0908 09/23/13 0318 09/24/13 0500  Weight: 78.3 kg (172 lb 9.9 oz) 78.5 kg (173 lb 1 oz) 78.4 kg (172 lb 13.5 oz)    Exam:   General:  Pt in NAD, alert and awake  Cardiovascular: + S1 and S2, no murmurs  Respiratory: CTA BL, no wheezes  Abdomen: soft, NT, ND  Musculoskeletal: no cyanosis or clubbing   Data Reviewed: Basic Metabolic Panel:  Recent Labs Lab 09/18/13 1825 09/18/13 2221 09/19/13 0349 09/20/13 0642 09/21/13 0544  NA 134*  --  141 136* 137  K 4.4  --  3.7 3.9 4.0  CL 91*  --  101 94* 97  CO2 29  --  25 27 27   GLUCOSE 431*  --  48* 110* 93  BUN 20  --  14 11 15   CREATININE 0.94 0.86 0.80 0.77 0.79  CALCIUM 9.1  --  8.8 8.6 8.6   Liver Function Tests:  Recent Labs Lab 09/18/13 1825 09/19/13 0349  AST 17 17  ALT 18 14  ALKPHOS 96 84  BILITOT <  0.2* <0.2*  PROT 7.6 6.7  ALBUMIN 3.5 3.1*   No results found for this basename: LIPASE, AMYLASE,  in the last 168 hours No results found for this basename: AMMONIA,  in the last 168 hours CBC:  Recent Labs Lab 09/18/13 1825  09/19/13 0349 09/20/13 0642 09/21/13 0544 09/22/13 0246 09/23/13 0414 09/24/13 0340  WBC 10.0  < > 10.5 11.3*  --  10.3 12.0* 9.6  NEUTROABS 6.9  --  5.5  --   --   --   --   --   HGB 11.3*  < > 10.4* 9.8* 8.5* 8.4* 8.4* 9.5*  HCT 33.7*  < > 31.0* 29.6* 25.0* 24.8* 24.4* 28.0*  MCV 89.9  < > 88.8 90.2  --  87.0 87.5 90.3  PLT 364  < > 379 335  --  346 395 427*  < > = values in this interval not displayed. Cardiac Enzymes:  Recent Labs Lab 09/21/13 1849 09/22/13 0246 09/22/13 1108  TROPONINI 0.41* 0.40*  <0.30   BNP (last 3 results)  Recent Labs  09/18/13 2221  PROBNP 700.3*   CBG:  Recent Labs Lab 09/23/13 1705 09/23/13 2100 09/24/13 0054 09/24/13 0055 09/24/13 0203  GLUCAP 90 131* 39* 41* 105*    Recent Results (from the past 240 hour(s))  SURGICAL PCR SCREEN     Status: None   Collection Time    09/19/13  9:21 AM      Result Value Ref Range Status   MRSA, PCR NEGATIVE  NEGATIVE Final   Staphylococcus aureus NEGATIVE  NEGATIVE Final   Comment:            The Xpert SA Assay (FDA     approved for NASAL specimens     in patients over 62 years of age),     is one component of     a comprehensive surveillance     program.  Test performance has     been validated by [employer] for patients greater     than or equal to 62 year old.     It is not intended     to diagnose infection nor to     guide or monitor treatment.     Studies: No results found.  Scheduled Meds: . amitriptyline  100 mg Oral QHS  . amLODipine  2.5 mg Oral Daily  . antiseptic oral rinse  15 mL Mouth Rinse BID  . aspirin EC  162 mg Oral BID  . calcium-vitamin D  1 tablet Oral Daily  . carvedilol  6.25 mg Oral BID WC  . clopidogrel  75 mg Oral Daily  . feeding supplement (ENSURE)  1 Container Oral BID BM  . fentaNYL  25 mcg Transdermal Q72H  . folic acid  0.5 mg Oral Daily  . furosemide  40 mg Oral Daily  . heparin subcutaneous  5,000 Units Subcutaneous 3 times per day  . insulin aspart  0-15 Units Subcutaneous TID WC  . [START ON 09/25/2013] insulin glargine  20 Units Subcutaneous Daily  . iron polysaccharides  150 mg Oral Daily  . isosorbide mononitrate  60 mg Oral Daily  . latanoprost  1 drop Both Eyes QHS  . pantoprazole  40 mg Oral QAC breakfast  . potassium chloride SA  30 mEq Oral Daily  . predniSONE  10 mg Oral Q breakfast  . sertraline  50 mg Oral Daily  . sodium chloride  3 mL Intravenous Q12H  .  timolol  1 drop Both Eyes Daily   Continuous Infusions:    Time spent: >  35 minutes    Penny PiaVEGA, Jb Dulworth  [facility] Pager [phone] If 7PM-7AM, please contact night-coverage at [email or web address], password [email or web address] 09/24/2013, 10:25 AM  LOS: 6 days

## 2013-09-24 NOTE — Progress Notes (Signed)
Report called to Teresa CoombsBetty Jo on 6N. Preparing to transport patient. Will continue to monitor. Asher Muir- Rudene Poulsen,RN
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
## 2013-09-24 NOTE — Progress Notes (Signed)
Physical Therapy Treatment Patient Details Name: [name]: [id number] DOB: 23-Jul-1950 Today's Date: 09/24/2013    History of Present Illness pt presents post fall sustaining R hip fx, now post cannulated hip pinning with increased troponin periop cath deferred     PT Comments    Pt anxious throughout session and benefits from education of plan, predetermined goal prior to mobility, encouragement and reassurance to progress mobility as pt with tendency for self-limiting behavior. Pt encouraged to continue mobility with nursing and HEP throughout the day to maximize function. Pt without carryover of education from prior sessions. Will follow.   Follow Up Recommendations  SNF     Equipment Recommendations       Recommendations for Other Services       Precautions / Restrictions Precautions Precautions: Fall Restrictions Weight Bearing Restrictions: No RLE Weight Bearing: Weight bearing as tolerated    Mobility  Bed Mobility Overal bed mobility: Needs Assistance Bed Mobility: Supine to Sit     Supine to sit: HOB elevated;Supervision     General bed mobility comments: HOB elevated with cues for sequence and scooting to EOB with increased time  Transfers Overall transfer level: Needs assistance   Transfers: Sit to/from Stand Sit to Stand: Min assist         General transfer comment: cues for hand placement, sequence and safety  Ambulation/Gait Ambulation/Gait assistance: Min assist Ambulation Distance (Feet): 32 Feet Assistive device: Rolling walker (2 wheeled) Gait Pattern/deviations: Step-to pattern   Gait velocity interpretation: Below normal speed for age/gender General Gait Details: cues for sequence to off load RLE with max cues, encourgement and reassurance throughout   Stairs            Wheelchair Mobility    Modified Rankin (Stroke Patients Only)       Balance                                    Cognition  Arousal/Alertness: Awake/alert Behavior During Therapy: Anxious Overall Cognitive Status: Within Functional Limits for tasks assessed                      Exercises General Exercises - Lower Extremity Long Arc Quad: AROM;Seated;Right;15 reps Hip Flexion/Marching: AROM;Seated;Right;15 reps    General Comments        Pertinent Vitals/Pain 6/10 right hip pain, premedicated, repositioned HR 77    Home Living                      Prior Function            PT Goals (current goals can now be found in the care plan section) Progress towards PT goals: Progressing toward goals    Frequency       PT Plan Current plan remains appropriate    Co-evaluation             End of Session Equipment Utilized During Treatment: Gait belt Activity Tolerance: Patient tolerated treatment well Patient left: in chair;with call bell/phone within reach     Time: 1024-1055 PT Time Calculation (min): 31 min  Charges:  $Gait Training: 8-22 mins $Therapeutic Activity: 8-22 mins                    G Codes:      Delorse Lekabor, Oluwadarasimi Redmon Beth 09/24/2013, 10:59 AM Delaney MeigsMaija Tabor Kashonda Sarkisyan, PT [phone]

## 2013-09-24 NOTE — Telephone Encounter (Signed)
Spoke with patient's Emergency contact Johnny BridgeMartha who expressed a great deal of concern of the medication changes that were being made in the hospital.She is very upset and [facility]. She states that [name] had fallen and was in the hospital and that "random Cardiologist" have been seeing her and changing her medications and she does not believe Dr.Hilty was notified and "Dr.Hilty has said in the past all changes must go through him for her care". When she ask why Dr.C changed her IMDUR she got the response "Because he can" I Informed her that Hilty is not here today but will be tomorrow and I will let him know [name] is in the hospital and has concerns. Patient voiced understanding.

## 2013-09-24 NOTE — Progress Notes (Signed)
Hypoglycemic Event  Pt called NT into room to check her blood sugar, which she felt was low. CBG: 39  Treatment: 15 GM carbohydrate snack  Symptoms: Nervous/irritable  Follow-up CBG: Time:0130 CBG Result:105  Possible Reasons for Event: Medication regimen: lantus has been changed this admission  Comments/MD notified:    Hitt, Merideth AbbeyLacey Megan  Remember to initiate Hypoglycemia Order Set & complete

## 2013-09-24 NOTE — Progress Notes (Signed)
Per patient's request, patient's partner [name] updated regarding patient's transfer to  5N Room 25. Will continue to monitor.  Asher Muir- Viviano Bir,RN

## 2013-09-24 NOTE — Telephone Encounter (Signed)
She wants to talk to Dr Rennis GoldenHilty nurse Valera Castleonly,she would not say what it was about.

## 2013-09-24 NOTE — Care Management Note (Signed)
    Page 1 of 1   09/24/2013     11:56:02 AM CARE MANAGEMENT NOTE 09/24/2013  Patient:  Daniel NonesBUSH,Danille A   Account Number:  [id number]  Date Initiated:  09/20/2013  Documentation initiated by:  Vance PeperBRADY,SUSAN  Subjective/Objective Assessment:   63 yr old female s/p right femoral neck fracture with Pinning.     Action/Plan:   Patient is for shortterm Rehab at SNF. Wants [facility]. Social worker is aware and involved. Patient [facility].   Anticipated DC Date:  09/25/2013   Anticipated DC Plan:  SKILLED NURSING FACILITY  In-house referral  Clinical Social Worker      DC Planning Services  CM consult      [facility]  NA   Choice offered to / List presented to:     DME arranged  NA        HH arranged  NA      Status of service:  In process, will continue to follow Medicare Important Message given?  YES (If response is "NO", the following Medicare IM given date fields will be blank) Date Medicare IM given:  09/24/2013 Medicare IM given by:  Donn PieriniWEBSTER,Ikeya Brockel Date Additional Medicare IM given:   Additional Medicare IM given by:    Discharge Disposition:  SKILLED NURSING FACILITY  Per UR Regulation:  Reviewed for med. necessity/level of care/duration of stay  If discussed at Long Length of Stay Meetings, dates discussed:    Comments:

## 2013-09-25 ENCOUNTER — Encounter (HOSPITAL_COMMUNITY): Payer: Self-pay | Admitting: Radiology

## 2013-09-25 ENCOUNTER — Inpatient Hospital Stay (HOSPITAL_COMMUNITY): Payer: Medicare Other

## 2013-09-25 ENCOUNTER — Telehealth: Payer: Self-pay | Admitting: Internal Medicine

## 2013-09-25 DIAGNOSIS — I255 Ischemic cardiomyopathy: Secondary | ICD-10-CM | POA: Diagnosis present

## 2013-09-25 LAB — URINE MICROSCOPIC-ADD ON

## 2013-09-25 LAB — URINALYSIS, ROUTINE W REFLEX MICROSCOPIC
BILIRUBIN URINE: NEGATIVE
Glucose, UA: 500 mg/dL — AB
Hgb urine dipstick: NEGATIVE
KETONES UR: NEGATIVE mg/dL
NITRITE: NEGATIVE
PROTEIN: NEGATIVE mg/dL
Specific Gravity, Urine: 1.011 (ref 1.005–1.030)
UROBILINOGEN UA: 0.2 mg/dL (ref 0.0–1.0)
pH: 7 (ref 5.0–8.0)

## 2013-09-25 LAB — CBC
HCT: 29.4 % — ABNORMAL LOW (ref 36.0–46.0)
HEMOGLOBIN: 9.8 g/dL — AB (ref 12.0–15.0)
MCH: 29.7 pg (ref 26.0–34.0)
MCHC: 33.3 g/dL (ref 30.0–36.0)
MCV: 89.1 fL (ref 78.0–100.0)
PLATELETS: 443 10*3/uL — AB (ref 150–400)
RBC: 3.3 MIL/uL — ABNORMAL LOW (ref 3.87–5.11)
RDW: 12.8 % (ref 11.5–15.5)
WBC: 11.6 10*3/uL — ABNORMAL HIGH (ref 4.0–10.5)

## 2013-09-25 LAB — GLUCOSE, CAPILLARY
Glucose-Capillary: 403 mg/dL — ABNORMAL HIGH (ref 70–99)
Glucose-Capillary: 482 mg/dL — ABNORMAL HIGH (ref 70–99)
Glucose-Capillary: 400 mg/dL — ABNORMAL HIGH (ref 70–99)
Glucose-Capillary: 338 mg/dL — ABNORMAL HIGH (ref 70–99)
Glucose-Capillary: 285 mg/dL — ABNORMAL HIGH (ref 70–99)
Glucose-Capillary: 419 mg/dL — ABNORMAL HIGH (ref 70–99)

## 2013-09-25 LAB — TROPONIN I
Troponin I: 3.21 ng/mL (ref <0.30)
Troponin I: 4.52 ng/mL (ref <0.30)
Troponin I: 6.94 ng/mL (ref <0.30)

## 2013-09-25 LAB — HEPARIN LEVEL (UNFRACTIONATED): Heparin Unfractionated: 0.55 IU/mL (ref 0.30–0.70)

## 2013-09-25 MED ORDER — INSULIN ASPART 100 UNIT/ML ~~LOC~~ SOLN: 0.0000 [IU] | SUBCUTANEOUS | Status: DC

## 2013-09-25 MED ORDER — HALOPERIDOL LACTATE 5 MG/ML IJ SOLN
1.0000 mg | Freq: Once | INTRAMUSCULAR | Status: AC
  Administered 2013-09-25: 1 mg via INTRAVENOUS
  Filled 2013-09-25: qty 1

## 2013-09-25 MED ORDER — FENTANYL 25 MCG/HR TD PT72: 25.0000 ug | MEDICATED_PATCH | TRANSDERMAL | Status: DC

## 2013-09-25 MED ORDER — INSULIN GLARGINE 100 UNIT/ML ~~LOC~~ SOLN
22.0000 [IU] | Freq: Every day | SUBCUTANEOUS | Status: DC
  Filled 2013-09-25: qty 0.22

## 2013-09-25 MED ORDER — CARVEDILOL 12.5 MG PO TABS
12.5000 mg | ORAL_TABLET | Freq: Two times a day (BID) | ORAL | Status: DC
  Administered 2013-09-25 – 2013-10-01 (×12): 12.5 mg via ORAL
  Filled 2013-09-25 (×16): qty 1

## 2013-09-25 MED ORDER — ALPRAZOLAM 0.5 MG PO TABS: 0.5000 mg | ORAL_TABLET | Freq: Two times a day (BID) | ORAL | Status: DC | PRN

## 2013-09-25 MED ORDER — INSULIN GLARGINE 100 UNIT/ML ~~LOC~~ SOLN
10.0000 [IU] | Freq: Once | SUBCUTANEOUS | Status: DC
  Filled 2013-09-25: qty 0.1

## 2013-09-25 MED ORDER — HEPARIN (PORCINE) IN NACL 100-0.45 UNIT/ML-% IJ SOLN
950.0000 [IU]/h | INTRAMUSCULAR | Status: DC
Start: 2013-09-25 — End: 2013-09-27
  Administered 2013-09-25: 1050 [IU]/h via INTRAVENOUS
  Administered 2013-09-27 (×2): 950 [IU]/h via INTRAVENOUS
  Filled 2013-09-25 (×4): qty 250

## 2013-09-25 MED ORDER — LORAZEPAM 2 MG/ML IJ SOLN: 0.5000 mg | Freq: Two times a day (BID) | INTRAMUSCULAR | Status: DC | PRN

## 2013-09-25 MED ORDER — HYDROCODONE-ACETAMINOPHEN 5-325 MG PO TABS: 1.0000 | ORAL_TABLET | ORAL | Status: DC | PRN

## 2013-09-25 MED ORDER — CARVEDILOL 3.125 MG PO TABS
3.1250 mg | ORAL_TABLET | Freq: Once | ORAL | Status: AC
  Administered 2013-09-25: 3.125 mg via ORAL
  Filled 2013-09-25: qty 1

## 2013-09-25 MED ORDER — LORAZEPAM 2 MG/ML IJ SOLN: 0.5000 mg | Freq: Once | INTRAMUSCULAR | Status: DC

## 2013-09-25 MED ORDER — INSULIN ASPART 100 UNIT/ML ~~LOC~~ SOLN
14.0000 [IU] | Freq: Once | SUBCUTANEOUS | Status: AC
  Administered 2013-09-25: 5 [IU] via SUBCUTANEOUS

## 2013-09-25 MED ORDER — ENSURE PUDDING PO PUDG: 1.0000 | Freq: Two times a day (BID) | ORAL | Status: DC

## 2013-09-25 MED ORDER — NITROGLYCERIN IN D5W 200-5 MCG/ML-% IV SOLN
5.0000 ug/min | INTRAVENOUS | Status: DC
  Administered 2013-09-25: 5 ug/min via INTRAVENOUS
  Filled 2013-09-25: qty 250

## 2013-09-25 MED ORDER — LORAZEPAM 2 MG/ML IJ SOLN
0.5000 mg | INTRAMUSCULAR | Status: DC
  Administered 2013-09-25: 0.5 mg via INTRAVENOUS
  Filled 2013-09-25: qty 1

## 2013-09-25 MED ORDER — ALPRAZOLAM 0.25 MG PO TABS
0.2500 mg | ORAL_TABLET | ORAL | Status: AC
  Administered 2013-09-25: 0.25 mg via ORAL
  Filled 2013-09-25: qty 1

## 2013-09-25 MED ORDER — DEXTROSE 50 % IV SOLN
INTRAVENOUS | Status: AC
Start: 2013-09-25 — End: 2013-09-26
  Administered 2013-09-26: 50 mL
  Filled 2013-09-25: qty 50

## 2013-09-25 MED ORDER — INSULIN ASPART 100 UNIT/ML ~~LOC~~ SOLN: 0.0000 [IU] | Freq: Every day | SUBCUTANEOUS | Status: DC

## 2013-09-25 MED ORDER — INSULIN ASPART 100 UNIT/ML ~~LOC~~ SOLN
5.0000 [IU] | Freq: Once | SUBCUTANEOUS | Status: AC
  Administered 2013-09-25: 5 [IU] via SUBCUTANEOUS

## 2013-09-25 MED ORDER — INSULIN ASPART 100 UNIT/ML ~~LOC~~ SOLN
0.0000 [IU] | Freq: Three times a day (TID) | SUBCUTANEOUS | Status: DC
  Administered 2013-09-25: 15 [IU] via SUBCUTANEOUS

## 2013-09-25 MED ORDER — FUROSEMIDE 10 MG/ML IJ SOLN
40.0000 mg | Freq: Once | INTRAMUSCULAR | Status: AC
  Administered 2013-09-25: 40 mg via INTRAVENOUS
  Filled 2013-09-25: qty 4

## 2013-09-25 NOTE — Telephone Encounter (Signed)
FYI --Ms [name] in hospital for week.  She is very upset.   Said she discussed this with BelgiumJenna yesterday.  Please call

## 2013-09-25 NOTE — Progress Notes (Signed)
Patient Demographics  [name], is a 63 y.o. female, DOB - 1950-08-20, XBJ:[id number]  Admit date - 09/18/2013   Admitting Physician Doree AlbeeSteven Newton, MD  Outpatient Primary MD for the patient is Fredirick MaudlinHAWKINS,EDWARD L, MD  LOS - 7   Chief Complaint  Patient presents with  . Fall  . Hip Pain        Brief summary. 63 year old Caucasian female with history of CAD, mild systolic CHF, chronic pain was admitted for mechanical fall causing right-sided femoral fracture, seen by cardiology and orthopedics underwent surgical correction by orthopedics on 09/19/2013. Main challenge is to minimize narcotic use, will require placement. Blood pressure slightly soft due to overuse of narcotics and due to relative adrenal insufficiency due to being on steroids chronically, blood pressure medications adjusted switch to stress dose steroids, minimize narcotics.   Patient did have mild bump in troponin now consistent with NSTEMI, cardiology following will be placed on heparin drip and transferred to step down.    Subjective:   Erika NonesJudy Duvall today has, No headache, No chest pain, No abdominal pain - No Nausea, No new weakness tingling or numbness, No Cough - SOB.  Currently no hip pain or chest pain. Did have chest pain last night. Mildly confused.    Assessment & Plan    1. Mechanical fall with right-sided subcapital femoral fracture - for now supportive care with pain control, Post open reduction internal fixation later on 09/19/2013 by Dr. Eulah PontMurphy. He tolerated procedure well, aspirin at 162 mg twice a day along with home dose Plavix for DVT prophylaxis per orthopedics, weightbearing as tolerated, initiate PT. she will likely require placement. Monitor H&H.     2.Diabetes mellitus type 1, uncontrolled, insulin dependent -  home dose Lantus increased, added moderate dose ISS, she skipped her Lantus 09/24/2013 henceforth her sugars were slightly high on 09/25/2013, will monitor closely.   CBG (last 3)   Recent Labs  09/25/13 0414 09/25/13 0604 09/25/13 0751  GLUCAP 482* 400* 338*      3. Essential hypertension. Blood pressure soft, blood pressure medications have been adjusted today, switched to stress dose steroids, have reduced diuretic today, on it her BP closely adjust medications as needed. We'll leave her on low-dose Coreg for now.    4.Polymyalgia rheumatica - chronically on by mouth steroids, blood pressure soft switch to stress dose steroids and monitor. Likely can stop stress dose steroids if blood pressure stable and switched to home dose prednisone.    5. 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 Status post stent to SVG D1 with Patent OM graft , patent LIMA and known occluded graft to RCA, had CABG in 1995. Chronic diastolic heart failure last known EF of 50%. Currently aspirin, have increased Coreg, Plavix being continued, will continue Imdur, home dose Lasix, as needed sublingual nitroglycerin. Troponin elevation on 09/24/2013 consistent with NSTEMI, we'll transition to heparin drip without bolus and her to step down to be followed by cardiology. Cardiology informed personally. Currently chest pain-free.    6. GERD place on PPI.    7. UTI. Has been treated.    8. Mild delirium on 09/25/2013. Minimize narcotics, repeat UA to rule out UTI again as she developed some urinary retention in the last few  days, monitor bladder emptying.      Code Status: Full  Family Communication: Significant other bedside  Disposition Plan: Likely SNF   Procedures ORIF R Femur  Dr Eulah Pont 09-19-13   Consults Ortho, cards   Medications  Scheduled Meds: . amitriptyline  100 mg Oral QHS  . amLODipine  2.5 mg Oral Daily  . antiseptic oral rinse  15 mL Mouth Rinse BID  . aspirin  EC  162 mg Oral BID  . calcium-vitamin D  1 tablet Oral Daily  . carvedilol  12.5 mg Oral BID WC  . carvedilol  3.125 mg Oral Once  . clopidogrel  75 mg Oral Daily  . feeding supplement (ENSURE)  1 Container Oral BID BM  . fentaNYL  25 mcg Transdermal Q72H  . folic acid  0.5 mg Oral Daily  . furosemide  40 mg Oral Daily  . insulin aspart  0-15 Units Subcutaneous TID WC  . insulin glargine  22 Units Subcutaneous Daily  . iron polysaccharides  150 mg Oral Daily  . isosorbide mononitrate  60 mg Oral Daily  . latanoprost  1 drop Both Eyes QHS  . LORazepam  0.5 mg Intravenous Once  . pantoprazole  40 mg Oral QAC breakfast  . potassium chloride SA  30 mEq Oral Daily  . predniSONE  10 mg Oral Q breakfast  . sertraline  50 mg Oral Daily  . sodium chloride  3 mL Intravenous Q12H  . timolol  1 drop Both Eyes Daily   Continuous Infusions:   PRN Meds:.acetaminophen, ALPRAZolam, HYDROcodone-acetaminophen, HYDROmorphone (DILAUDID) injection, metoprolol, nitroGLYCERIN, ondansetron (ZOFRAN) IV, polyvinyl alcohol, sodium chloride  DVT Prophylaxis    SCDs    Lab Results  Component Value Date   PLT 443* 09/25/2013    Antibiotics     Anti-infectives   Start     Dose/Rate Route Frequency Ordered Stop   09/19/13 2300  ceFAZolin (ANCEF) IVPB 2 g/50 mL premix     2 g 100 mL/hr over 30 Minutes Intravenous Every 6 hours 09/19/13 1943 09/20/13 0529   09/19/13 1200  cefTRIAXone (ROCEPHIN) 1 g in dextrose 5 % 50 mL IVPB     1 g 100 mL/hr over 30 Minutes Intravenous Every 24 hours 09/19/13 1032 09/21/13 1243   09/19/13 0845  ceFAZolin (ANCEF) IVPB 2 g/50 mL premix     2 g 100 mL/hr over 30 Minutes Intravenous On call to O.R. 09/19/13 0831 09/19/13 1656          Objective:   Filed Vitals:   09/25/13 0204 09/25/13 0300 09/25/13 0525 09/25/13 0715  BP: 151/87 157/71 163/60   Pulse: 99 98 99   Temp: 98.3 F (36.8 C) 98.2 F (36.8 C) 98.8 F (37.1 C)   TempSrc: Oral Oral Oral   Resp: 20 23  20    Height:      Weight:    75 kg (165 lb 5.5 oz)  SpO2: 93% 94% 100%     Wt Readings from Last 3 Encounters:  09/25/13 75 kg (165 lb 5.5 oz)  09/25/13 75 kg (165 lb 5.5 oz)  06/18/13 72.757 kg (160 lb 6.4 oz)     Intake/Output Summary (Last 24 hours) at 09/25/13 1030 Last data filed at 09/25/13 0914  Gross per 24 hour  Intake    360 ml  Output   1425 ml  Net  -1065 ml     Physical Exam  Awake pleasantly confused but oriented x3, No new F.N deficits, Normal affect  Gallant.AT,PERRAL Supple Neck,No JVD, No cervical lymphadenopathy appriciated.  Symmetrical Chest wall movement, Good air movement bilaterally, CTAB RRR,No Gallops,Rubs , +ve soft systolic mitral valve Murmur, No Parasternal Heave +ve B.Sounds, Abd Soft, No tenderness, No organomegaly appriciated, No rebound - guarding or rigidity. No Cyanosis, Clubbing or edema, No new Rash or bruise     Data Review   Micro Results Recent Results (from the past 240 hour(s))  SURGICAL PCR SCREEN     Status: None   Collection Time    09/19/13  9:21 AM      Result Value Ref Range Status   MRSA, PCR NEGATIVE  NEGATIVE Final   Staphylococcus aureus NEGATIVE  NEGATIVE Final   Comment:            The Xpert SA Assay (FDA     approved for NASAL specimens     in patients over 34 years of age),     is one component of     a comprehensive surveillance     program.  Test performance has     been validated by [employer] for patients greater     than or equal to 102 year old.     It is not intended     to diagnose infection nor to     guide or monitor treatment.    Radiology Reports Dg Hip Complete Right  09/18/2013   CLINICAL DATA:  Right hip pain secondary to a fall.  EXAM: RIGHT HIP - COMPLETE 2+ VIEW  COMPARISON:  None.  FINDINGS: There is a slightly impacted subcapital fracture or right femoral neck. No displacement.  Pelvic bones are intact.  IMPRESSION: Impacted subcapital fracture of the right femoral neck.    Electronically Signed   By: Geanie Cooley M.D.   On: 09/18/2013 18:59   Dg Chest Port 1 View  09/18/2013   CLINICAL DATA:  Preop hip fracture.  Diabetes.  Hypertension.  EXAM: PORTABLE CHEST - 1 VIEW  COMPARISON:  01/07/2013  FINDINGS: Postoperative changes in the mediastinum. Normal heart size and pulmonary vascularity. Interstitial changes in the lungs probably representing fibrosis, similar to previous study no focal airspace disease. No blunting of costophrenic angles. No pneumothorax.  IMPRESSION: No active disease.   Electronically Signed   By: Burman Nieves M.D.   On: 09/18/2013 21:04    CBC  Recent Labs Lab 09/18/13 1825  09/19/13 0349 09/20/13 0865 09/21/13 0544 09/22/13 0246 09/23/13 0414 09/24/13 0340 09/25/13 0257  WBC 10.0  < > 10.5 11.3*  --  10.3 12.0* 9.6 11.6*  HGB 11.3*  < > 10.4* 9.8* 8.5* 8.4* 8.4* 9.5* 9.8*  HCT 33.7*  < > 31.0* 29.6* 25.0* 24.8* 24.4* 28.0* 29.4*  PLT 364  < > 379 335  --  346 395 427* 443*  MCV 89.9  < > 88.8 90.2  --  87.0 87.5 90.3 89.1  MCH 30.1  < > 29.8 29.9  --  29.5 30.1 30.6 29.7  MCHC 33.5  < > 33.5 33.1  --  33.9 34.4 33.9 33.3  RDW 12.6  < > 12.6 12.6  --  12.3 12.4 13.0 12.8  LYMPHSABS 2.1  --  3.9  --   --   --   --   --   --   MONOABS 0.7  --  0.9  --   --   --   --   --   --   EOSABS 0.2  --  0.3  --   --   --   --   --   --   BASOSABS 0.0  --  0.1  --   --   --   --   --   --   < > = values in this interval not displayed.  Chemistries   Recent Labs Lab 09/18/13 1825 09/18/13 2221 09/19/13 0349 09/20/13 0642 09/21/13 0544  NA 134*  --  141 136* 137  K 4.4  --  3.7 3.9 4.0  CL 91*  --  101 94* 97  CO2 29  --  25 27 27   GLUCOSE 431*  --  48* 110* 93  BUN 20  --  14 11 15   CREATININE 0.94 0.86 0.80 0.77 0.79  CALCIUM 9.1  --  8.8 8.6 8.6  AST 17  --  17  --   --   ALT 18  --  14  --   --   ALKPHOS 96  --  84  --   --   BILITOT <0.2*  --  <0.2*  --   --     ------------------------------------------------------------------------------------------------------------------ estimated creatinine clearance is 73.2 ml/min (by C-G formula based on Cr of 0.79). ------------------------------------------------------------------------------------------------------------------ No results found for this basename: HGBA1C,  in the last 72 hours ------------------------------------------------------------------------------------------------------------------ No results found for this basename: CHOL, HDL, LDLCALC, TRIG, CHOLHDL, LDLDIRECT,  in the last 72 hours ------------------------------------------------------------------------------------------------------------------ No results found for this basename: TSH, T4TOTAL, FREET3, T3FREE, THYROIDAB,  in the last 72 hours ------------------------------------------------------------------------------------------------------------------  Recent Labs  09/23/13 0414  VITAMINB12 1463*  FOLATE >20.0  FERRITIN 108  TIBC 223*  IRON 77  RETICCTPCT 2.9    Coagulation profile  Recent Labs Lab 09/18/13 1825  INR 0.94    No results found for this basename: DDIMER,  in the last 72 hours  Cardiac Enzymes  Recent Labs Lab 09/22/13 1108 09/25/13 0257 09/25/13 0855  TROPONINI <0.30 <0.30 3.21*   ------------------------------------------------------------------------------------------------------------------ No components found with this basename: POCBNP,      Time Spent in minutes   35   SINGH,PRASHANT K M.D on 09/25/2013 at 10:30 AM  Between 7am to 7pm - Pager - [phone]  After 7pm go to [email or web address] - password [email or web address]  And look for the night coverage person covering for me after hours  [facility] Office  [phone]   **Disclaimer: This note may have been dictated with voice recognition software. Similar sounding words can inadvertently be transcribed and this note may  contain transcription errors which may not have been corrected upon publication of note.**

## 2013-09-25 NOTE — Telephone Encounter (Signed)
Spoke with Johnny BridgeMartha and she indicated she doesn't feel Daniel NonesJudy Savitz is being treated properly because she is seeing a lot of physicians she hasn't seen before and they are changing her meds which she feels is adversely affecting her health.  States she had to be moved to the Heart Unit because of a panic attack because her Xanax was decreased.  Wanted answers from me but I suggested she speak with the nurses and staff at the hospital and they can explain why her meds are being adjusted.  She states the nurses tell her "they don't know why".  She doesn't feel her treatment thus far has been in her best interest.  Discussed with Dr. Rennis GoldenHilty and he will see tomorrow while at the hospital.

## 2013-09-25 NOTE — Progress Notes (Signed)
PT Cancellation Note  Patient Details Name: Erika DerryJudy A Weir MRN: [id number] DOB: 02/21/1951   Cancelled Treatment:    Reason Eval/Treat Not Completed: Medical issues which prohibited therapy (elevated triponins, transferred to [facility]).  PT will check back as pt stabilizes medically.  Thanks,    Rollene Rotundaebecca B. Lashan Macias, PT, DPT [phone]   09/25/2013, 1:14 PM

## 2013-09-25 NOTE — Progress Notes (Signed)
ANTICOAGULATION CONSULT NOTE - Initial Consult  Pharmacy Consult for Heparin Indication: NSTEMI  Allergies  Allergen Reactions  . Codeine     Unknown  . Flagyl [Metronidazole]     Unknown  . Fluocinonide     Unknown  . Ranexa [Ranolazine] Other (See Comments)    Elevated liver function  . Statins Other (See Comments)    Muscle aches  . Biaxin [Clarithromycin] Rash  . Demerol [Meperidine] Rash    Patient Measurements: Height: 5\' 4"  (162.6 cm) Weight: 165 lb 5.5 oz (75 kg) IBW/kg (Calculated) : 54.7 Heparin Dosing Weight:75 kg  Vital Signs: Temp: 98.8 F (37.1 C) (07/15 0525) Temp src: Oral (07/15 0525) BP: 163/60 mmHg (07/15 0525) Pulse Rate: 99 (07/15 0525)  Labs:  Recent Labs  09/22/13 1100 09/22/13 1108 09/22/13 1930  09/23/13 0414 09/24/13 0340 09/25/13 0257 09/25/13 0855  HGB  --   --   --   < > 8.4* 9.5* 9.8*  --   HCT  --   --   --   --  24.4* 28.0* 29.4*  --   PLT  --   --   --   --  395 427* 443*  --   HEPARINUNFRC 0.29*  --  0.49  --  0.43  --   --   --   TROPONINI  --  <0.30  --   --   --   --  <0.30 3.21*  < > = values in this interval not displayed.  Estimated Creatinine Clearance: 73.2 ml/min (by C-G formula based on Cr of 0.79).   Medical History: Past Medical History  Diagnosis Date  . Respiratory arrest   . COPD (chronic obstructive pulmonary disease)   . CHF (congestive heart failure)   . Diabetes mellitus     insulin-dep  . Fibromyalgia   . Deaf     bilateral neurosensory hearing loss - Dr. Narda Bondshris Newman  . Myocardial infarction   . Stroke   . GERD (gastroesophageal reflux disease)   . Depression   . Hypertension   . Pneumonia   . Coronary artery disease     a. h/o CABG  . Polymyalgia rheumatica   . Unstable angina   . PAD (peripheral artery disease)     LEAs 05/10/2012 - L SFA 50-69% diameter reduction, bilat ICA with 0-49% diameter reduction  . Tobacco abuse   . History of nuclear stress test 11/2011    lexiscan; low  risk, non-diagnostic for ischemia    Medications:  Prescriptions prior to admission  Medication Sig Dispense Refill  . amitriptyline (ELAVIL) 100 MG tablet Take 100 mg by mouth at bedtime.      Marland Kitchen. amLODipine (NORVASC) 2.5 MG tablet Take 2.5 mg by mouth daily.      Marland Kitchen. aspirin EC 81 MG tablet Take 162 mg by mouth 2 (two) times daily.      . Calcium Carb-Cholecalciferol 600-500 MG-UNIT CAPS Take 1 capsule by mouth daily.      . carvedilol (COREG) 25 MG tablet Take 25 mg by mouth 2 (two) times daily with a meal.      . clopidogrel (PLAVIX) 75 MG tablet Take 1 tablet (75 mg total) by mouth daily.  30 tablet  11  . COD LIVER OIL PO Take 1 capsule by mouth daily.       . Cyanocobalamin (VITAMIN B-12 PO) Take 1 tablet by mouth daily.      . diclofenac sodium (VOLTAREN) 1 % GEL Apply 4  g topically daily as needed (muscle pain).       . folic acid (FOLVITE) 800 MCG tablet Take 1,600 mcg by mouth daily.      . furosemide (LASIX) 40 MG tablet Take 40-60 mg by mouth See admin instructions. Take 40 mg every  morning.  Take 20 mg every other evening.      . insulin glargine (LANTUS) 100 UNIT/ML injection Inject 25 Units into the skin 2 (two) times daily. Patient takes 25 units in the morning (7-8am) and 8 units every evening(6pm)      . insulin lispro (HUMALOG) 100 UNIT/ML injection Inject 5-12 Units into the skin 3 (three) times daily before meals. 5-12 units per sliding scale      . iron polysaccharides (NU-IRON) 150 MG capsule Take 1 capsule (150 mg total) by mouth daily.      . isosorbide mononitrate (IMDUR) 60 MG 24 hr tablet Take 1 tablet (60 mg total) by mouth 2 (two) times daily.  60 tablet  10  . Lutein 20 MG CAPS Take 1 capsule by mouth daily.      . nitroGLYCERIN (NITROSTAT) 0.4 MG SL tablet Place 0.4 mg under the tongue every 5 (five) minutes as needed for chest pain.      Marland Kitchen omeprazole (PRILOSEC) 20 MG capsule Take 20 mg by mouth daily.      Marland Kitchen oxyCODONE-acetaminophen (PERCOCET) 10-325 MG per  tablet Take 1 tablet by mouth every 4 (four) hours as needed for pain.      . potassium chloride SA (K-DUR,KLOR-CON) 20 MEQ tablet Take 30 mEq by mouth daily.      . predniSONE (DELTASONE) 10 MG tablet Take 10 mg by mouth daily.      . Pyridoxine HCl (VITAMIN B-6 PO) Take 1 tablet by mouth daily.      . sertraline (ZOLOFT) 50 MG tablet Take 50 mg by mouth daily.      . timolol (TIMOPTIC) 0.25 % ophthalmic solution Place 1 drop into both eyes daily.       . TRAVATAN Z 0.004 % SOLN ophthalmic solution Place 1 drop into both eyes at bedtime.      . triamcinolone cream (KENALOG) 0.1 % Apply 1 application topically daily. Apply to rash      . valsartan (DIOVAN) 40 MG tablet Take 40 mg by mouth daily.      . vitamin E 400 UNIT capsule Take 400 Units by mouth daily.      Marland Kitchen zinc gluconate 50 MG tablet Take 50 mg by mouth daily.      Marland Kitchen ZINC GLUCONATE PO Take by mouth.      . [DISCONTINUED] ALPRAZolam (XANAX) 1 MG tablet Take 1 mg by mouth 4 (four) times daily as needed for sleep or anxiety.      . [DISCONTINUED] fentaNYL (DURAGESIC - DOSED MCG/HR) 25 MCG/HR Place 1 patch onto the skin every 3 (three) days.        Assessment: 63 y.o. female s/p hip ORIF 7/9. Noted pt with CP and elevated trop 0.41 post-op. Pt was on heparin gtt from 7/11-7/13 (1050 units/hr with therapeutic levels). Pt with extensive cardiac history - CAD, s/p CABG 1995, multiple PCIs since. Cardiology consulted and recommended medical management as troponin trended down. Troponin now back up to 3.21. Pt to restart heparin gtt for NSTEMI - no bolus per MD order. Last 5000 unit SQ heparin given this a.m. ~0530. Hgb low but stable, plt high.  Goal of Therapy:  Heparin level  0.3-0.7 units/ml Monitor platelets by anticoagulation protocol: Yes   Plan:  1. Begin heparin gtt at 1050 units/hr. No bolus per MD order 2. Will f/u 6 hr heparin level 3. Daily CBC and heparin level  Christoper Fabian, PharmD, BCPS Clinical pharmacist, pager  [phone] 09/25/2013,10:29 AM

## 2013-09-25 NOTE — Progress Notes (Signed)
09/25/2013 patient transfer from 6 north to 2central at 1430. She is alert to person, not aware of time or place. She is weak had right hip surgery the area is bruise. Scratches on lower legs and discoloration to the upper arm. Bruises on the abdomen from heparin stick. Patient transfer on heparin drip and nitro drip was hang when patient arrived on the unit. [facility] CenterNadine Caleah Tortorelli RN.

## 2013-09-25 NOTE — Progress Notes (Signed)
Patient complained of chest pain and nitro was given. Cardiology due to see patient anytime now. Vital signs and oxygen saturation monitored, patient agreeing to wear oxygen now and scd's. Heparin drip has been ordered for patient and patient to transfer to stepdown. Rapid response nurse notified.

## 2013-09-25 NOTE — Significant Event (Signed)
Rapid Response Event Note Called to room 6 N08 for pt with increasing anxiety and confusion. Pt post op 6 days right hip surgery Pt complained of chest pain at 0020 and was given NTGx2 with some resolve. Pt now with increasing anxiety and worsening confusion.   Overview: Time Called: 0223 Arrival Time: 0225 Event Type: Neurologic;Cardiac  Initial Focused Assessment: Pt awake alert and oriented to name only. Follows some commands but is very argumentative. Complains of chest pain. Initially p02 86-89 due to pt refusal to wear 02. Lungs clear. Right hip dressing intact. K.Kirby NP called prior to my arrival with orders received to give ativan 0.5mg . Ativan given and Donnamarie PoagK. Kirby called to update on my assessment. Haldol ordered. See flowsheet for VS  Interventions: EKG obtained x2 prior to my arrival, reviewed per triad NP and myself with no changes from previous EKG done this admission. 2L [place] placed on pt, pt reoriented frequently. 02 increased to 5 LNC with po2 resolving to 90s. Haldol 1mg  given IVP. RN advised to monitor closely and call my self and triad NP for worsening confusion.  Event Summary: Name of Physician Notified: Donnamarie PoagK. Kirby at 0246    at    Outcome: Stayed in room and stabalized     [name], [name]

## 2013-09-25 NOTE — Progress Notes (Addendum)
Shift event: RN paged Merdis DelayK. Schorr, my co worker at [facility], because K. Schorr is assigned for call to the "tower" rooms. Pt was c/o chest pain for a fleeting period of time. Also, SOB, but O2 sats were normal without increased WOB. The RN gave pt a NTG SL and before the pill even melted, the pt stated "oh my chest pain is gone". 12 lead EKG was done and looks same as one previously-reviewed by this NP (as I am in the house at [facility]). When pt came into hospital her troponins were elevated but 3rd one was lower. Cardiology was consulted due to this and pt's hx of CAD. Cardiology said this was likely due to strain associated with pt's other problems and not a NSTEMI. Last cardio note says pt should continue conservative medical mngt. Is on Plavix and ASA.  Later, RN paged NP again secondary to pt being confused and talking to people not there. Ativan was given followed by Haldol. Pt is on Xanax at home and reduced dose has been given here, so ? Withdrawal. Rapid response RN went to room. Pt wouldn't keep O2 on but after replaced she is satting 94% without increased WOB although somewhat agitated. RR RN reports pt appears very anxious.  We will repeat troponins. Consider ABG if she desats again (although it was when she had her O2 off). Consider DDimer but will likely be elevated secondary to pt's other issues here.  Will continue to watch for awhile. If sx continue, consider other tx plan.  Jimmye NormanKaren Kirby-Graham, NP [facility] Update: This NP went to eyeball pt. She is resting quietly in bed. VSS and she has no increased WOB nor does she appear distressed. O2 at 4L (rapid RN placed her on 6L).now and is holding sats. This NP thinks most of her issues are anxiety and possibly dementia. She likely needs MMSE and other dementia workup as an outpt. (?).  Continue to follow.  KJKG, NP Pt non compliant with DM diet. Sugar over 400 after eating pack of M&Ms and hard candy. Novolog 14U given. First troponin <  .30.

## 2013-09-25 NOTE — Telephone Encounter (Signed)
Thanks for letting me know.  Dr. H 

## 2013-09-25 NOTE — Progress Notes (Signed)
OT Cancellation Note  Patient Details Name: [name] MRN: [id number] DOB: 1950/04/18   Cancelled Treatment:    Reason Eval/Treat Not Completed: Medical issues which prohibited therapy - elevated Troponins.  Will see once pt cleared medically   Dyani Babel, Williemae NatterWendi M Rayni Nemitz Cameron Parkonarpe, OTR/L [phone]  09/25/2013, 10:40 AM

## 2013-09-25 NOTE — Progress Notes (Signed)
0610 CBG 400 Patient refuse to take any more insulin stated she would wait and take her Lantus and novolog this am

## 2013-09-25 NOTE — Progress Notes (Signed)
Critical troponin called to [facility]  and attending notified also. Order for ekg received. Patient is a/o no complaints of chest pain at this time. Refused oxygen at this time.

## 2013-09-25 NOTE — Progress Notes (Addendum)
[id number] Patient cbg 482 patient had ate m&ms and hard candy. Maren ReamerKaren Kirby NP notified and order received. Patient refuse to take 14 units would only take 5units. Will continue to monitor.

## 2013-09-25 NOTE — Progress Notes (Signed)
   CSW spoke with [facility] rep  [phone] and rep is requesting restart auth process when Pt's status changes.   CSW will continue to follow Pt for d/c planning.    Leron Croakassandra Ahniyah Giancola [facility] CtrCSWA  [facility]  4N 1-16;  [phone] Phone: [phone]

## 2013-09-25 NOTE — Progress Notes (Signed)
09/25/2013 patient is npo [name] from cariology was notified if plans for patient to have cath in the morning. Per [name] she is not sure, but keep patient npo. Blood sugar was change to every 4 hours per Dr. Thedore MinsSingh. [facility] HospitalNadine Foster Sonnier RN.

## 2013-09-25 NOTE — Progress Notes (Signed)
Subjective:  Chest pain and SOB last night. Troponin negative x 2 but Troponin from 9 am 3.21.  She continues to complain of chest pain.   Objective:  Vital Signs in the last 24 hours: Temp:  [98.1 F (36.7 C)-98.8 F (37.1 C)] 98.4 F (36.9 C) (07/15 1040) Pulse Rate:  [73-99] 94 (07/15 1040) Resp:  [16-23] 18 (07/15 1040) BP: (138-172)/(53-92) 172/92 mmHg (07/15 1040) SpO2:  [93 %-100 %] 100 % (07/15 1040) Weight:  [165 lb 5.5 oz (75 kg)] 165 lb 5.5 oz (75 kg) (07/15 0715)  Intake/Output from previous day:  Intake/Output Summary (Last 24 hours) at 09/25/13 1157 Last data filed at 09/25/13 0914  Gross per 24 hour  Intake    360 ml  Output   1075 ml  Net   -715 ml    Physical Exam: General appearance: cooperative, no distress and pale Lungs: basilar crackles Heart: regular rate and rhythm Neurologic: Grossly normal though she appears a little confused   Rate: 88  Rhythm: normal sinus rhythm  Lab Results:  Recent Labs  09/24/13 0340 09/25/13 0257  WBC 9.6 11.6*  HGB 9.5* 9.8*  PLT 427* 443*   No results found for this basename: NA, K, CL, CO2, GLUCOSE, BUN, CREATININE,  in the last 72 hours  Recent Labs  09/25/13 0257 09/25/13 0855  TROPONINI <0.30 3.21*   No results found for this basename: INR,  in the last 72 hours  Imaging: Dg Chest Port 1 View  09/25/2013   CLINICAL DATA:  63 year old female with chest pain cough altered mental status. Recent hip surgery. Initial encounter.  EXAM: PORTABLE CHEST - 1 VIEW  COMPARISON:  09/18/2013 and earlier.  FINDINGS: Portable AP semi upright view at 1059 hrs. More lordotic view. Stable cardiac size and mediastinal contours. Mildly lower lung volumes. Increased pulmonary interstitial opacity with slight basilar predominance. No pneumothorax, pleural effusion or consolidation.  IMPRESSION: Increased pulmonary interstitial opacity. Differential considerations: Developing interstitial edema versus viral/atypical  respiratory infection.   Electronically Signed   By: Augusto GambleLee  Hall M.D.   On: 09/25/2013 11:10    Cardiac Studies: EKG- NSR with LVH by voltage and more pronounced inferior lateral ST changes.   Assessment/Plan:  63 y/o female, previously followed by Dr. Alanda AmassWeintraub, now by Dr Rennis GoldenHilty. Her cardiac history is significant for CAD, s/p CABG in 1995 with a LIMA to LAD, saphenous vein graft to OM1, saphenous vein graft to a diagonal, and saphenous vein graft to the right coronary artery. She has had multiple PCIs. Her last cath/ PCI was Oct 2014. All native vessels were occluded. The vein graft to the RCA was occluded as well which was a new finding. The vein graft to the diagonal branch had a 95% stenosis at the site of a 30% stenosis 2 years ago. At that time she had successful PCI and stenting of a high-grade diagonal branch SVG lesion. She was admitted 09/18/13 with a broken hip. She's been unsteady on her feet and has to use a walker or a cane to walk around. Post op she had some chest pain with a Troponin of 0.41 and we treated her medically. She was supposed to go home today. She has significant anxiety and that always makes it difficult to assess her symptoms.      Principal Problem:   NSTEMI (non-ST elevated myocardial infarction) Active Problems:   Hip fracture- surgery 09/19/13   Diabetes mellitus type 1, uncontrolled, insulin dependent   CAD- multiple PCIs  Anxiety   Cardiomyopathy, ischemic- EF 35-40% at cath Oct 2014   Polymyalgia rheumatica   HTN (hypertension)   Hearing loss sensory, bilateral   Elevated troponin- 0.41 post op hip    PLAN: Start IV Heparin and NTG. Transfer to stepdown.  Consider IV Lasix x 1, see CXR report. Her EF at cath was 35-40% in October. She may need to be re studied. If her cath is negative consider CTA to r/o PE.   Corine Shelter PA-C Beeper [phone] 09/25/2013, 11:57 AM  Patient examined. Two of her friends are with her in the room.  They are very concerned  that she has not been on the same cardiac meds that she was on at home. Patient is very anxious. She continues to have mild chest pain. Her main complaint at present is inability to pass urine. I reviewed her EKG from this morning which shows increased inferolateral ST depression when compared with 09/22/13. Troponins are being recycled. Telemetry shows sinus NSR 91/min. Lungs reveal bibasilar crackles. Heart reveals S4 gallop. Chest xray reviewed by me suggestive of CHF rather than pneumonia. Plan: Continue IV heparin, IV NTG.Already on ASA and plavix. Increase carvedilol stepwise back to eventual home dose. Give IV Lasix for Acute on chronic systolic HF. Doubt pulmonary embolus but get 2D echo to evaluate RV and LV function. If patient fails to improve on above measures, may need cath.

## 2013-09-25 NOTE — Progress Notes (Signed)
Called report to [place]au[place]ad[place], Charity fundraiserN on 2C. [facility]

## 2013-09-25 NOTE — Progress Notes (Signed)
0020 Patient c/o of chest pressure, B/P 144/66 HR 84 and regular. 0021 Nitroglycerin 0.4mg  given SL. 0023 Patient states she feels better no further c/o chest pressure. EKG obtained. Tama GanderKatherine Schorr NP notified of patient's c/o chest pressure and ordered tropnin level for the am. 0040 Patient's b/p 122/56. 0110 Patient very anxious and states she had heard people outside her door talking about my friend dying. Reassure patient that no one was talking outside her door. Patient very anxious and wants a xanax. Xanax not due until 0439. Maren ReamerKaren Kirby call about getting a copy of the EKG since it did not transfer over and was told of patient being very anxious. Xanax order obtained. 0127 Xanax 0.25mg  given po. 0135 Patient yelling out c/o chest pain and SOB. Nitroglycerin SL 0.4mg  given. 0140 Vitals 105/61 p 102 O2 sats 87% on RA. Patient very confused yelling out. Attempted to apply O2 nasal cannula patient refused. 0145 Maren ReamerKaren Kirby NP notified of patient c/o chest pain and O2 sats. Orders obtained for ativan. EKG obtained.0205 B/p 151/87, P91 O2 sat 89. Reapply O2 on patient sats now 93% 0220 Maren ReamerKaren Kirby stated to call rapid response nurse to assess patient. Rapid response nurse notified of patient's condition and came and assessed patient. 0236 Ativan 0.5mg  given IV. Patient still very restless confused and anxious0301. Hadol 1mg  given IV. Sats staying 89 to 90%. O2 increase to 6l/m nasal cannula. Patient sats are 94 to 97%. Patient not as anxious and no more c/o of chest pain. Will continue to monitor patient still very confuse oriented to person only.

## 2013-09-25 NOTE — Progress Notes (Signed)
Chest pain is stable. She just received IV Lasix. Her Troponin is up 4.52. Reviewed with Dr Patty SermonsBrackbill. I will make her NPO after midnight till seen by Dr Rennis GoldenHilty in the am. I will giver her 1/2 dose Lantus this pm as she will be NPO after midnight.   Corine ShelterLUKE Emelie Newsom PA-C 09/25/2013 3:57 PM

## 2013-09-26 ENCOUNTER — Encounter (HOSPITAL_COMMUNITY)
Admission: EM | Disposition: A | Discharge status: Skilled Nursing Facility | Payer: Medicare Other | Source: Home / Self Care | Attending: Internal Medicine

## 2013-09-26 DIAGNOSIS — J9601 Acute respiratory failure with hypoxia: Secondary | ICD-10-CM | POA: Diagnosis present

## 2013-09-26 DIAGNOSIS — M353 Polymyalgia rheumatica: Secondary | ICD-10-CM

## 2013-09-26 DIAGNOSIS — F05 Delirium due to known physiological condition: Secondary | ICD-10-CM

## 2013-09-26 DIAGNOSIS — R7989 Other specified abnormal findings of blood chemistry: Secondary | ICD-10-CM

## 2013-09-26 DIAGNOSIS — F411 Generalized anxiety disorder: Secondary | ICD-10-CM

## 2013-09-26 DIAGNOSIS — R404 Transient alteration of awareness: Secondary | ICD-10-CM

## 2013-09-26 DIAGNOSIS — I10 Essential (primary) hypertension: Secondary | ICD-10-CM

## 2013-09-26 DIAGNOSIS — R410 Disorientation, unspecified: Secondary | ICD-10-CM | POA: Diagnosis not present

## 2013-09-26 DIAGNOSIS — I359 Nonrheumatic aortic valve disorder, unspecified: Secondary | ICD-10-CM

## 2013-09-26 DIAGNOSIS — J9600 Acute respiratory failure, unspecified whether with hypoxia or hypercapnia: Secondary | ICD-10-CM

## 2013-09-26 LAB — TROPONIN I: Troponin I: 5.8 ng/mL (ref <0.30)

## 2013-09-26 LAB — BASIC METABOLIC PANEL
Anion gap: 20 — ABNORMAL HIGH (ref 5–15)
BUN: 16 mg/dL (ref 6–23)
CO2: 26 mEq/L (ref 19–32)
Calcium: 10.2 mg/dL (ref 8.4–10.5)
Chloride: 95 mEq/L — ABNORMAL LOW (ref 96–112)
Creatinine, Ser: 0.87 mg/dL (ref 0.50–1.10)
GFR calc Af Amer: 82 mL/min — ABNORMAL LOW (ref >90)
GFR calc non Af Amer: 70 mL/min — ABNORMAL LOW (ref >90)
Glucose, Bld: 167 mg/dL — ABNORMAL HIGH (ref 70–99)
Potassium: 4.1 mEq/L (ref 3.7–5.3)
Sodium: 141 mEq/L (ref 137–147)

## 2013-09-26 LAB — HEPARIN LEVEL (UNFRACTIONATED): Heparin Unfractionated: 0.47 IU/mL (ref 0.30–0.70)

## 2013-09-26 LAB — CBC
HCT: 34.8 % — ABNORMAL LOW (ref 36.0–46.0)
HEMOGLOBIN: 12 g/dL (ref 12.0–15.0)
MCH: 30.8 pg (ref 26.0–34.0)
MCHC: 34.5 g/dL (ref 30.0–36.0)
MCV: 89.2 fL (ref 78.0–100.0)
PLATELETS: 551 10*3/uL — AB (ref 150–400)
RBC: 3.9 MIL/uL (ref 3.87–5.11)
RDW: 12.9 % (ref 11.5–15.5)
WBC: 17.2 10*3/uL — AB (ref 4.0–10.5)

## 2013-09-26 LAB — GLUCOSE, CAPILLARY
GLUCOSE-CAPILLARY: 165 mg/dL — AB (ref 70–99)
GLUCOSE-CAPILLARY: 293 mg/dL — AB (ref 70–99)
GLUCOSE-CAPILLARY: 62 mg/dL — AB (ref 70–99)
Glucose-Capillary: 104 mg/dL — ABNORMAL HIGH (ref 70–99)
Glucose-Capillary: 185 mg/dL — ABNORMAL HIGH (ref 70–99)
Glucose-Capillary: 239 mg/dL — ABNORMAL HIGH (ref 70–99)
Glucose-Capillary: 274 mg/dL — ABNORMAL HIGH (ref 70–99)
Glucose-Capillary: 39 mg/dL — CL (ref 70–99)
Glucose-Capillary: 394 mg/dL — ABNORMAL HIGH (ref 70–99)
Glucose-Capillary: 447 mg/dL — ABNORMAL HIGH (ref 70–99)

## 2013-09-26 SURGERY — LEFT HEART CATHETERIZATION WITH CORONARY ANGIOGRAM
Anesthesia: LOCAL

## 2013-09-26 MED ORDER — WHITE PETROLATUM GEL
Status: AC
  Administered 2013-09-26: 0.2
  Filled 2013-09-26: qty 5

## 2013-09-26 MED ORDER — INSULIN GLARGINE 100 UNIT/ML ~~LOC~~ SOLN
20.0000 [IU] | Freq: Every day | SUBCUTANEOUS | Status: DC
  Administered 2013-09-26 – 2013-09-28 (×3): 20 [IU] via SUBCUTANEOUS
  Filled 2013-09-26 (×3): qty 0.2

## 2013-09-26 MED ORDER — HALOPERIDOL LACTATE 5 MG/ML IJ SOLN: 2.0000 mg | Freq: Once | INTRAMUSCULAR | Status: DC

## 2013-09-26 MED ORDER — INSULIN ASPART 100 UNIT/ML ~~LOC~~ SOLN: 0.0000 [IU] | SUBCUTANEOUS | Status: DC

## 2013-09-26 MED ORDER — ALPRAZOLAM 0.5 MG PO TABS
0.5000 mg | ORAL_TABLET | Freq: Four times a day (QID) | ORAL | Status: DC | PRN
  Administered 2013-09-27 – 2013-10-01 (×6): 0.5 mg via ORAL
  Filled 2013-09-26 (×7): qty 1

## 2013-09-26 MED ORDER — INSULIN GLARGINE 100 UNIT/ML ~~LOC~~ SOLN
10.0000 [IU] | Freq: Every day | SUBCUTANEOUS | Status: DC
  Administered 2013-09-26 – 2013-09-27 (×2): 10 [IU] via SUBCUTANEOUS
  Filled 2013-09-26 (×3): qty 0.1

## 2013-09-26 MED ORDER — INSULIN GLARGINE 100 UNIT/ML ~~LOC~~ SOLN: 32.0000 [IU] | Freq: Every day | SUBCUTANEOUS | Status: DC

## 2013-09-26 MED ORDER — ALPRAZOLAM 0.5 MG PO TABS
1.0000 mg | ORAL_TABLET | Freq: Four times a day (QID) | ORAL | Status: DC | PRN
  Administered 2013-09-26 (×3): 1 mg via ORAL
  Filled 2013-09-26 (×3): qty 2

## 2013-09-26 MED ORDER — INSULIN ASPART 100 UNIT/ML ~~LOC~~ SOLN
0.0000 [IU] | SUBCUTANEOUS | Status: DC
  Administered 2013-09-26: 5 [IU] via SUBCUTANEOUS
  Administered 2013-09-26: 2 [IU] via SUBCUTANEOUS
  Administered 2013-09-26: 9 [IU] via SUBCUTANEOUS
  Administered 2013-09-27: 5 [IU] via SUBCUTANEOUS
  Administered 2013-09-27: 9 [IU] via SUBCUTANEOUS
  Administered 2013-09-27: 3 [IU] via SUBCUTANEOUS
  Administered 2013-09-27: 7 [IU] via SUBCUTANEOUS
  Administered 2013-09-28 (×2): 3 [IU] via SUBCUTANEOUS
  Administered 2013-09-28: 2 [IU] via SUBCUTANEOUS
  Administered 2013-09-28: 3 [IU] via SUBCUTANEOUS
  Administered 2013-09-28: 5 [IU] via SUBCUTANEOUS
  Administered 2013-09-28: 9 [IU] via SUBCUTANEOUS
  Administered 2013-09-29: 5 [IU] via SUBCUTANEOUS

## 2013-09-26 MED ORDER — INSULIN GLARGINE 100 UNIT/ML ~~LOC~~ SOLN
10.0000 [IU] | Freq: Once | SUBCUTANEOUS | Status: DC
  Filled 2013-09-26: qty 0.1

## 2013-09-26 NOTE — Progress Notes (Signed)
ANTICOAGULATION CONSULT NOTE - Follow Up Consult  Pharmacy Consult for heparin Indication: nstemi  Allergies  Allergen Reactions  . Codeine     Unknown  . Flagyl [Metronidazole]     Unknown  . Fluocinonide     Unknown  . Ranexa [Ranolazine] Other (See Comments)    Elevated liver function  . Statins Other (See Comments)    Muscle aches  . Biaxin [Clarithromycin] Rash  . Demerol [Meperidine] Rash    Patient Measurements: Height: 5\' 4"  (162.6 cm) Weight: 165 lb 5.5 oz (75 kg) IBW/kg (Calculated) : 54.7 Heparin Dosing Weight:   Vital Signs: Temp: 98.2 F (36.8 C) (07/15 2300) Temp src: Oral (07/15 2300) BP: 146/61 mmHg (07/16 0143) Pulse Rate: 93 (07/16 0143)  Labs:  Recent Labs  09/23/13 0414 09/24/13 0340 09/25/13 0257  09/25/13 1424 09/25/13 1615 09/25/13 2020 09/26/13 0139  HGB 8.4* 9.5* 9.8*  --   --   --   --  12.0  HCT 24.4* 28.0* 29.4*  --   --   --   --  34.8*  PLT 395 427* 443*  --   --   --   --  551*  HEPARINUNFRC 0.43  --   --   --   --  0.55  --  0.47  CREATININE  --   --   --   --   --   --   --  0.87  TROPONINI  --   --  <0.30  < > 4.52*  --  6.94* 5.80*  < > = values in this interval not displayed.  Estimated Creatinine Clearance: 67.3 ml/min (by C-G formula based on Cr of 0.87).   Medications:  Heparin infusing at 1050 units/hr   Assessment: Heparin level 0.47 no bleeding noted.  Goal of Therapy:  Heparin level 0.3-0.7 units/ml Monitor platelets by anticoagulation protocol: Yes   Plan:  Continue at 1050/hr. F/u daily   Janice Coffinarl, Halie Gass Jonathan 09/26/2013,3:34 AM

## 2013-09-26 NOTE — Progress Notes (Signed)
Pt troponin increased to 6.94. Dr. Mayford Knifeurner notified. No new orders given. Will continue to monitor.

## 2013-09-26 NOTE — Progress Notes (Signed)
PT Cancellation Note  Patient Details Name: [name] MRN: [id number] DOB: July 10, 1950   Cancelled Treatment:    Reason Eval/Treat Not Completed: Patient not medically ready. Pt s/p MI and now delerious and in restraints. Pt currently unable to participate with PT. Will attempt 09/27/13   Kongmeng Santoro 09/26/2013, 11:37 AM Pager [phone]

## 2013-09-26 NOTE — Progress Notes (Signed)
Pt is very confused, got OOB,  ripped  her iv apart and for her safety was placed in restraints, on-call was made aware and an order was given for the restraints. Will continue to monitor pt-----Lysette Lindenbaum, rn

## 2013-09-26 NOTE — Progress Notes (Signed)
[facility]  [name]:[id number] DOB: 1950-03-28 DOA: 09/18/2013 PCP: Fredirick Maudlin, MD   Brief narrative: 63 year old Caucasian female with history of CAD, mild systolic CHF, chronic pain. Admitted after mechanical fall causing right-sided femoral fracture. Evaluated by cardiology and orthopedics then underwent surgical correction by orthopedics on 09/19/2013. Blood pressure was slightly soft due to overuse of narcotics and due to relative adrenal insufficiency (chronic sterids due to PMR).  Ant-HTN meds were adjusted and she was switched to stress dose steroids, minimize narcotics.   She developed CP and SOB overnight into 7/15. She did have mild bump in troponin 7/15 consistent with NSTEMI.  EKG with inferolateral TW changes without elevation. Cardiology initiated a heparin drip and transferred to pt step down.  HPI/Subjective: Today is delirious and experiencing auditory hallucinations. Requiring restraints  Assessment/Plan: Active Problems:   Anxiety/Acute delirium -multifactorial: prolonged hospitalization, recent stress dose steroids, recent change in narcotic dose, and suspected under dosing BZD's -use restraints as needed for safety -Patient with continued delirium will decrease Xanax to 0.5 mg q 6hr PRN anxiety; currently patient is incapable of communicating if anxious or not (babbles incoherently). However caregiver insists patient receive full dose benzodiazepine and narcotics. Counseled caregiver this could actually be exacerbating her delirium/delusions in light of her acute health problems. -dc Dilaudid-cont Vicodin and home Fentanyl patch -1x dose Haldol if needed -no family during my rounds but later available when Dr. Rennis Golden rounded so was udated per him at that time -Swallowing evaluation before proceed with diet -Urinalysis mildly positive and culture pending-if truly has UTI this could be contributing to delirious state as  well    NSTEMI /CAD- multiple PCIs/Elevated troponin -Cards managing -TNI peak 6.94 with slow trend down -planned cath dc'd with no plans to pursue in future -cath Oct 2014 with diffuse CAD but no amenable PCI targets so focus will be on medical rxn -cont BB, ASA, Plavix, IV Heparin    Acute respiratory failure with hypoxia/Acute diastolic heart failure exacerbation -diuresing well on PO Lasix -Repeat echo this admission now shows normal LVEF of 55-60% with grade 1 diastolic dysfunction which is improved from systolic dysfunction seen on cardiac catheterization October 2014 at that time patient had an EF of 35-40% -incontinent of urine due to AMS so insert foley for accurate I/O and to protect skin -has weaned off oxygen this am      Diabetes mellitus type 1, uncontrolled, insulin dependent -Hgb A1c 10.4 -at home was on high am and low pm dose Lantus-titrate up as indicated -cont senstive SSI    Polymyalgia rheumatica on chronic steroids -back on home dose Prednisone    HTN (hypertension) -controlled    Hip fracture- surgery 09/19/13 -PT/OT once delirium resolved and TNI lower   DVT prophylaxis: IV heparin Code Status: Full Family Communication: No family at bedside during my rounds but later patient's significant other presented to the bedside and Dr. Rennis Golden (who was in the room examining the patient) updated her on patient's recent change in status and rationale for not proceeding with cardiac catheterization Disposition Plan/Expected LOS: Stepdown   Consultants: Cardiology  Procedures: 2-D echocardiogram - Left ventricle: The cavity size was normal. Systolic function was normal. The estimated ejection fraction was in the range of 55% to 60%. Doppler parameters are consistent with abnormal left ventricular relaxation (grade 1 diastolic dysfunction). - Aortic valve: There was mild regurgitation.   Cultures: Urine culture  pending  Antibiotics: None  Objective: Blood pressure 126/47, pulse 83, temperature 98 F (36.7 C), temperature source Oral, resp. rate 18, height 5\' 4"  (1.626 m), weight 150 lb 2.1 oz (68.1 kg), SpO2 95.00%.  Intake/Output Summary (Last 24 hours) at 09/26/13 1325 Last data filed at 09/26/13 1200  Gross per 24 hour  Intake 402.26 ml  Output   1500 ml  Net -1097.74 ml     Exam: General: No acute respiratory distress Lungs: Clear to auscultation bilaterally without wheezes or crackles, RA Cardiovascular: Regular rate and rhythm without murmur gallop or rub normal S1 and S2, no peripheral edema or JVD Abdomen: Nontender, nondistended, soft, bowel sounds positive, no rebound, no ascites, no appreciable mass Musculoskeletal: No significant cyanosis, clubbing of bilateral lower extremities Neurological: Alert and completely disoriented and delirious with noted auditory hallucinations, requiring bilateral upper extremity restraints for safety  Scheduled Meds:  Scheduled Meds: . amitriptyline  100 mg Oral QHS  . amLODipine  2.5 mg Oral Daily  . antiseptic oral rinse  15 mL Mouth Rinse BID  . aspirin EC  162 mg Oral BID  . calcium-vitamin D  1 tablet Oral Daily  . carvedilol  12.5 mg Oral BID WC  . clopidogrel  75 mg Oral Daily  . feeding supplement (ENSURE)  1 Container Oral BID BM  . fentaNYL  25 mcg Transdermal Q72H  . folic acid  0.5 mg Oral Daily  . furosemide  40 mg Oral Daily  . haloperidol lactate  2 mg Intravenous Once  . insulin aspart  0-9 Units Subcutaneous 6 times per day  . insulin glargine  10 Units Subcutaneous Once  . insulin glargine  20 Units Subcutaneous Daily   And  . insulin glargine  10 Units Subcutaneous QHS  . insulin glargine  10 Units Subcutaneous Once  . iron polysaccharides  150 mg Oral Daily  . latanoprost  1 drop Both Eyes QHS  . LORazepam  0.5 mg Intravenous Once  . pantoprazole  40 mg Oral QAC breakfast  . potassium chloride SA  30 mEq Oral  Daily  . predniSONE  10 mg Oral Q breakfast  . sertraline  50 mg Oral Daily  . sodium chloride  3 mL Intravenous Q12H  . timolol  1 drop Both Eyes Daily   Continuous Infusions: . heparin 1,050 Units/hr (09/25/13 1854)  . nitroGLYCERIN 5 mcg/min (09/25/13 1855)    Data Reviewed: Basic Metabolic Panel:  Recent Labs Lab 09/20/13 0642 09/21/13 0544 09/26/13 0139  NA 136* 137 141  K 3.9 4.0 4.1  CL 94* 97 95*  CO2 27 27 26   GLUCOSE 110* 93 167*  BUN 11 15 16   CREATININE 0.77 0.79 0.87  CALCIUM 8.6 8.6 10.2   Liver Function Tests: No results found for this basename: AST, ALT, ALKPHOS, BILITOT, PROT, ALBUMIN,  in the last 168 hours No results found for this basename: LIPASE, AMYLASE,  in the last 168 hours No results found for this basename: AMMONIA,  in the last 168 hours CBC:  Recent Labs Lab 09/22/13 0246 09/23/13 0414 09/24/13 0340 09/25/13 0257 09/26/13 0139  WBC 10.3 12.0* 9.6 11.6* 17.2*  HGB 8.4* 8.4* 9.5* 9.8* 12.0  HCT 24.8* 24.4* 28.0* 29.4* 34.8*  MCV 87.0 87.5 90.3 89.1 89.2  PLT 346 395 427* 443* 551*   Cardiac Enzymes:  Recent Labs Lab 09/25/13 0257 09/25/13 0855 09/25/13 1424 09/25/13 2020 09/26/13 0139  TROPONINI <0.30 3.21* 4.52* 6.94* 5.80*   BNP (last 3 results)  Recent Labs  09/18/13  2221  PROBNP 700.3*   CBG:  Recent Labs Lab 09/25/13 2348 09/26/13 0025 09/26/13 0419 09/26/13 0750 09/26/13 1220  GLUCAP 62* 165* 274* 394* 447*    Recent Results (from the past 240 hour(s))  SURGICAL PCR SCREEN     Status: None   Collection Time    09/19/13  9:21 AM      Result Value Ref Range Status   MRSA, PCR NEGATIVE  NEGATIVE Final   Staphylococcus aureus NEGATIVE  NEGATIVE Final   Comment:            The Xpert SA Assay (FDA     approved for NASAL specimens     in patients over 62 years of age),     is one component of     a comprehensive surveillance     program.  Test performance has     been validated by [facility]  for patients greater     than or equal to 62 year old.     It is not intended     to diagnose infection nor to     guide or monitor treatment.     Studies:  Recent x-ray studies have been reviewed in detail by the Attending Physician  Time spent :      Junious Silkllison Ellis, ANP [facility] Office  [phone] Pager [phone]   **If unable to reach the above provider after paging please contact the Flow Manager @ [phone]  On-Call/Text Page:      [email or web address]      password [email or web address]  If 7PM-7AM, please contact night-coverage [email or web address] Password [email or web address] 09/26/2013, 1:25 PM   LOS: 8 days   Examined patient, discussed assessment and plan with ANP Revonda StandardAllison and agree with the above plan. Patient with multiple complex medical problems greater than 35 minutes was spent in direct patient care.

## 2013-09-26 NOTE — Progress Notes (Signed)
OT Cancellation Note  Patient Details Name: [name] MRN: [id number] DOB: [birth year]   Cancelled Treatment:    Reason Eval/Treat Not Completed: Patient not medically ready (troponin levels elevated ) pt currently with delirium. Ot to continue to follow and check back as appropriate.  Harolyn RutherfordJones, Lucifer Soja B Pager: [phone]  09/26/2013, 8:25 AM

## 2013-09-26 NOTE — Progress Notes (Addendum)
Pt remains confused with moments of clarity ie recognizing her partner, Johnny BridgeMartha. Restraints remain in place, pt now eats and drinks. Will continue to monitor for improved neuro status and attempt to mobilize tomorrow.

## 2013-09-26 NOTE — Progress Notes (Signed)
Inpatient Diabetes Program Recommendations  AACE/ADA: New Consensus Statement on Inpatient Glycemic Control (2013)  Target Ranges:  Prepandial:   less than 140 mg/dL      Peak postprandial:   less than 180 mg/dL (1-2 hours)      Critically ill patients:  140 - 180 mg/dL   Results for [name], [name] (MRN [id number]) as of 09/26/2013 09:29  Ref. Range 09/25/2013 04:14 09/25/2013 06:04 09/25/2013 07:51 09/25/2013 12:05 09/25/2013 17:20 09/25/2013 20:36 09/25/2013 22:07 09/25/2013 23:48 09/26/2013 00:25 09/26/2013 04:19 09/26/2013 07:50  Glucose-Capillary Latest Range: 70-99 mg/dL 324482 (H) 401400 (H) 027338 (H) 285 (H) 419 (H) 239 (H) 104 (H) 62 (L) 165 (H) 274 (H) 394 (H)   Diabetes history: DM1 Outpatient Diabetes medications: Lantus 25 units QAM (between 8-9 am, Lantus 8 units Q evening (at 6pm), Humalog 5-12 units TID with meals Current orders for Inpatient glycemic control: Lantus 20 units QAM, Lantus 10 units QPM, Novolog 0-5 units QHS  Inpatient Diabetes Program Recommendations Insulin - Basal: Noted orders for Lantus 20 units QAM and Lantus 10 units QHS that were ordered this morning.  Correction (SSI): Please consider ordering CBGs with  Novolog sensitive correction scale Q4H .   Note: In reviewing the chart, noted that patient has only received Lantus once (Lantus 25 units on 09/23/13 @ 10:59am) since being admitted. Patient is [name] type 1 diabetic and requires basal/bolus insulin as she has absolute insulin deficiency. Noted orders for Lantus 20 units QAM and Lantus 10 units QPM which were ordered today. NURSING: Please be sure to administer basal insulin as ordered unless ordered to hold by MD. Currently, there is only an order for Novolog correction to be given at bedtime.  Therefore, please order CBGs with Novolog sensitive correction scale Q4H (while NPO and then change to ACHS when diet resumed).  Thanks, Orlando PennerMarie Hurman Ketelsen, RN, MSN, CCRN Diabetes Coordinator Inpatient Diabetes Program [phone] (Team  Pager) [phone] ([facility] office) [phone] [facility] office)

## 2013-09-26 NOTE — Progress Notes (Signed)
Patient Name: Erika DerryJudy A Leeth Date of Encounter: 09/26/2013     Principal Problem:   NSTEMI (non-ST elevated myocardial infarction) Active Problems:   Diabetes mellitus type 1, uncontrolled, insulin dependent   Polymyalgia rheumatica   HTN (hypertension)   CAD- multiple PCIs   Hearing loss sensory, bilateral   Hip fracture- surgery 09/19/13   Elevated troponin- 0.41 post op hip   Anxiety   Cardiomyopathy, ischemic- EF 35-40% at cath Oct 2014    SUBJECTIVE  This morning the patient is agitated and confused.  This may be an adverse effect from Dilaudid.  She may also be withdrawing from Xanax.  She has been on high doses of Xanax at home. Dr. Rennis GoldenHilty saw her earlier this morning and Dr. Rennis GoldenHilty spoke with the patient's partner by phone.  We will not plan to do a cardiac catheterization today.  We'll continue medical therapy for her non-STEMI at this point. Her troponins are still rising.  The last one was a 1 AM today.  We will continue to trace troponin curve. Her electrocardiogram today shows less ischemia and no new acute changes. The patient had an echocardiogram earlier today results pending.  CURRENT MEDS . amitriptyline  100 mg Oral QHS  . amLODipine  2.5 mg Oral Daily  . antiseptic oral rinse  15 mL Mouth Rinse BID  . aspirin EC  162 mg Oral BID  . calcium-vitamin D  1 tablet Oral Daily  . carvedilol  12.5 mg Oral BID WC  . clopidogrel  75 mg Oral Daily  . feeding supplement (ENSURE)  1 Container Oral BID BM  . fentaNYL  25 mcg Transdermal Q72H  . folic acid  0.5 mg Oral Daily  . furosemide  40 mg Oral Daily  . haloperidol lactate  2 mg Intravenous Once  . insulin aspart  0-9 Units Subcutaneous 6 times per day  . insulin glargine  10 Units Subcutaneous Once  . insulin glargine  20 Units Subcutaneous Daily   And  . insulin glargine  10 Units Subcutaneous QHS  . insulin glargine  10 Units Subcutaneous Once  . iron polysaccharides  150 mg Oral Daily  . latanoprost  1 drop  Both Eyes QHS  . LORazepam  0.5 mg Intravenous Once  . pantoprazole  40 mg Oral QAC breakfast  . potassium chloride SA  30 mEq Oral Daily  . predniSONE  10 mg Oral Q breakfast  . sertraline  50 mg Oral Daily  . sodium chloride  3 mL Intravenous Q12H  . timolol  1 drop Both Eyes Daily    OBJECTIVE  Filed Vitals:   09/26/13 0143 09/26/13 0500 09/26/13 0543 09/26/13 0800  BP: 146/61  168/53   Pulse: 93  102   Temp:   98.1 F (36.7 C) 98.8 F (37.1 C)  TempSrc:    Oral  Resp: 24  26   Height:      Weight:  150 lb 2.1 oz (68.1 kg)    SpO2: 100%  100%     Intake/Output Summary (Last 24 hours) at 09/26/13 1111 Last data filed at 09/26/13 0900  Gross per 24 hour  Intake 222.26 ml  Output    625 ml  Net -402.74 ml   Filed Weights   09/24/13 0500 09/25/13 0715 09/26/13 0500  Weight: 172 lb 13.5 oz (78.4 kg) 165 lb 5.5 oz (75 kg) 150 lb 2.1 oz (68.1 kg)    PHYSICAL EXAM  General: Pleasant, NAD.  Currently in  restraints as of confusion and agitation Neuro: Marland Kitchen Moves all extremities spontaneously. Psych: Delusional affect HEENT:  Normal  Neck: Supple without bruits or JVD. Lungs:  Resp regular and unlabored, CTA. Heart: RRR no s3, s4, or murmurs. Abdomen: Soft, non-tender, non-distended, BS + x 4.  Extremities: No clubbing, cyanosis or edema. DP/PT/Radials 2+ and equal bilaterally.  Accessory Clinical Findings  CBC  Recent Labs  09/25/13 0257 09/26/13 0139  WBC 11.6* 17.2*  HGB 9.8* 12.0  HCT 29.4* 34.8*  MCV 89.1 89.2  PLT 443* 551*   Basic Metabolic Panel  Recent Labs  09/26/13 0139  NA 141  K 4.1  CL 95*  CO2 26  GLUCOSE 167*  BUN 16  CREATININE 0.87  CALCIUM 10.2   Liver Function Tests No results found for this basename: AST, ALT, ALKPHOS, BILITOT, PROT, ALBUMIN,  in the last 72 hours No results found for this basename: LIPASE, AMYLASE,  in the last 72 hours Cardiac Enzymes  Recent Labs  09/25/13 1424 09/25/13 2020 09/26/13 0139  TROPONINI  4.52* 6.94* 5.80*   BNP No components found with this basename: POCBNP,  D-Dimer No results found for this basename: DDIMER,  in the last 72 hours Hemoglobin A1C No results found for this basename: HGBA1C,  in the last 72 hours Fasting Lipid Panel No results found for this basename: CHOL, HDL, LDLCALC, TRIG, CHOLHDL, LDLDIRECT,  in the last 72 hours Thyroid Function Tests No results found for this basename: TSH, T4TOTAL, FREET3, T3FREE, THYROIDAB,  in the last 72 hours  TELE  Normal sinus rhythm  ECG  Normal sinus rhythm with nonspecific ST-T wave changes  Radiology/Studies  Dg Hip Complete Right  09/18/2013   CLINICAL DATA:  Right hip pain secondary to a fall.  EXAM: RIGHT HIP - COMPLETE 2+ VIEW  COMPARISON:  None.  FINDINGS: There is a slightly impacted subcapital fracture or right femoral neck. No displacement.  Pelvic bones are intact.  IMPRESSION: Impacted subcapital fracture of the right femoral neck.   Electronically Signed   By: Geanie Cooley M.D.   On: 09/18/2013 18:59   Dg Hip Operative Right  09/19/2013   CLINICAL DATA:  Operative images from the ORIF of a right subcapital femoral neck fracture.  EXAM: DG OPERATIVE RIGHT HIP  TECHNIQUE: A single spot fluoroscopic AP image of the right hip is submitted.  COMPARISON:  09/18/2013  FINDINGS: Three screws reduce the subcapital fracture into near anatomic alignment. Mild residual valgus angulation is noted. Orthopedic hardware is well-seated. No evidence of a new fracture or operative complication.  IMPRESSION: ORIF of a right subcapital femoral neck fracture as described.   Electronically Signed   By: Amie Portland M.D.   On: 09/19/2013 17:41   Ct Head Wo Contrast  09/25/2013   CLINICAL DATA:  Decreased mentation  EXAM: CT HEAD WITHOUT CONTRAST  TECHNIQUE: Contiguous axial images were obtained from the base of the skull through the vertex without intravenous contrast.  COMPARISON:  10/09/2012  FINDINGS: Mild to moderate diffuse  atrophy. No hydrocephalus. No evidence of hemorrhage, infarct, mass, or extra-axial fluid. Calvarium is intact.  IMPRESSION: No acute findings   Electronically Signed   By: Esperanza Heir M.D.   On: 09/25/2013 22:12   Dg Pelvis Portable  09/19/2013   CLINICAL DATA:  Postop right hip.  EXAM: PORTABLE PELVIS 1-2 VIEWS  COMPARISON:  None.  FINDINGS: Three screws reduce a femoral neck fracture on the right. Fracture is in near anatomic alignment with mild  valgus angulation. Orthopedic hardware is well-seated. No evidence of an operative complication.  IMPRESSION: Status post ORIF of a right femoral neck fracture.   Electronically Signed   By: Amie Portland M.D.   On: 09/19/2013 18:21   Dg Chest Port 1 View  09/25/2013   CLINICAL DATA:  63 year old female with chest pain cough altered mental status. Recent hip surgery. Initial encounter.  EXAM: PORTABLE CHEST - 1 VIEW  COMPARISON:  09/18/2013 and earlier.  FINDINGS: Portable AP semi upright view at 1059 hrs. More lordotic view. Stable cardiac size and mediastinal contours. Mildly lower lung volumes. Increased pulmonary interstitial opacity with slight basilar predominance. No pneumothorax, pleural effusion or consolidation.  IMPRESSION: Increased pulmonary interstitial opacity. Differential considerations: Developing interstitial edema versus viral/atypical respiratory infection.   Electronically Signed   By: Augusto Gamble M.D.   On: 09/25/2013 11:10   Dg Chest Port 1 View  09/18/2013   CLINICAL DATA:  Preop hip fracture.  Diabetes.  Hypertension.  EXAM: PORTABLE CHEST - 1 VIEW  COMPARISON:  01/07/2013  FINDINGS: Postoperative changes in the mediastinum. Normal heart size and pulmonary vascularity. Interstitial changes in the lungs probably representing fibrosis, similar to previous study no focal airspace disease. No blunting of costophrenic angles. No pneumothorax.  IMPRESSION: No active disease.   Electronically Signed   By: Burman Nieves M.D.   On: 09/18/2013  21:04    ASSESSMENT AND PLAN  1.  Non-STEMI 2. known ischemic heart disease with multiple prior PCI's. 3. ischemic cardiomyopathy with ejection fraction 35-40% at catheter in October 2014 4. Leukocytosis--workup per primary team 5. diabetes mellitus 6. acute on chronic anxiety and acute confusional state.  Plan: Per discussion with Dr. Rennis Golden, no plans for catheterization at this point.  Continue medical therapy.  Signed, Cassell Clement MD

## 2013-09-26 NOTE — Progress Notes (Signed)
  Echocardiogram 2D Echocardiogram has been performed.  Arvil ChacoFoster, Unknown Schleyer 09/26/2013, 9:47 AM

## 2013-09-26 NOTE — Evaluation (Signed)
Clinical/Bedside Swallow Evaluation Patient Details  Name: [name] MRN: [id number] Date of Birth: Mar 26, 1950  Today's Date: 09/26/2013 Time: 4696-2952 SLP Time Calculation (min): 12 min  Past Medical History:  Past Medical History  Diagnosis Date  . Respiratory arrest   . COPD (chronic obstructive pulmonary disease)   . CHF (congestive heart failure)   . Diabetes mellitus     insulin-dep  . Fibromyalgia   . Deaf     bilateral neurosensory hearing loss - Dr. Narda Bonds  . Myocardial infarction   . Stroke   . GERD (gastroesophageal reflux disease)   . Depression   . Hypertension   . Pneumonia   . Coronary artery disease     a. h/o CABG  . Polymyalgia rheumatica   . Unstable angina   . PAD (peripheral artery disease)     LEAs 05/10/2012 - L SFA 50-69% diameter reduction, bilat ICA with 0-49% diameter reduction  . Tobacco abuse   . History of nuclear stress test 11/2011    lexiscan; low risk, non-diagnostic for ischemia   Past Surgical History:  Past Surgical History  Procedure Laterality Date  . Coronary artery bypass graft  04/03/1993    x5 LIMA to LAD; SVG to DX1; SVG to RCA, SVG to OM;  (Dr. Tyrone Sage)  . Eye surgery    . Coronary angioplasty  1995    RCA PTCA (07/23/1993); mid-LAD PTCA (08/21/1993); PTCA for restenosis RCA (09/13/1993),   . Coronary angioplasty  1998    Cfx & LAD PTCA  . Coronary angioplasty  1999    Cfx & LAD PTCA  . Coronary angioplasty  09/17/2004    cutting balloon small vessel mid LAD beyond LIMA insertion (2.5x2mm Cypher to mid SVG to RCA  . Coronary angioplasty  05/21/2010    patent LIMA-LAD, diffuse disease distally, patent SVG-small diagonal, patent SVG-right w/widely patent stent, patent SVG-OM1 (Dr. Kirtland Bouchard. [place] Hilty)  . Transthoracic echocardiogram  12/2010    EF=>55%, mild conc LVH; IVC non-dilated w/>50% collapse; mild mitral annular calcif, calcified mitral apparatus, mild MD; mild TR & elevated RVSP; mild-mod AV regurg  . Coronary  angioplasty  07/18/2008    patent SVG to acute marginal & PDA, SVG to OM1, SVG to diagonal 1, & LIMA to LAD (Dr. Evlyn Courier(  . Coronary angioplasty  04/20/2007    patent SVG-diagonal, SVG-obtuse marginal, SVG-RCA, LIMA-LAD, patent stent to SVG in RCA (Dr. Claudia Desanctis)  . Cardiac catheterization  04/10/2007    3x41mm Promus DES to SVG to RCA  . Coronary angioplasty  03/22/2006    patent LIMA to LAD, patent SVG to small diagonal, patent SVG to OM, patent vein graft to RV acute marginal & RCA (Dr. Laurell Josephs)  . Coronary angioplasty  12/03/2002    ballon angioplasty to lesion in prox LAD, prox Cfx & stenting, angioplasty to L AV groove artery (Dr. Jonette Eva)  . Coronary angioplasty  09/08/2000    Dr. Jonette Eva  . Hip pinning,cannulated  09/19/2013  . Hip pinning,cannulated Right 09/19/2013    Procedure: CANNULATED HIP PINNING;  Surgeon: Sheral Apley, MD;  Location: [facility] OR;  Service: Orthopedics;  Laterality: Right;   Fracture table, c-arm, [employer]- Mr. Eulah Pont to call rep   HPI:  63 y.o. year old female with PMH:  CAD s/p CABG, MI 01/2013, IDDM, COPD, CHF, deafness w/ secondary inner ear disorder admitted with fall and hip fracture.  Head CT No acute findings.  CXR 7/15 Increased pulmonary interstitial opacity.  Differential considerations: Developing interstitial edema versus viral/atypical respiratory infection.    Assessment / Plan / Recommendation Clinical Impression  Pt. demonstrated functional oral and pharyngeal swallow ability assessed with family at bedside.  Pt. cooperative but confused.  No indications of dysfunction.  Recommend continue regular texture and thin liquids.  No f/u needed.    Aspiration Risk  Mild    Diet Recommendation Regular;Thin liquid   Liquid Administration via: Straw;Cup Medication Administration: Whole meds with liquid Supervision: Patient able to self feed;Intermittent supervision to cue for compensatory strategies Compensations: Slow rate;Small  sips/bites Postural Changes and/or Swallow Maneuvers: Seated upright 90 degrees    Other  Recommendations Oral Care Recommendations: Oral care BID   Follow Up Recommendations  None    Frequency and Duration        Pertinent Vitals/Pain WDL         Swallow Study         Oral/Motor/Sensory Function Overall Oral Motor/Sensory Function: Appears within functional limits for tasks assessed   Ice Chips Ice chips: Not tested   Thin Liquid Thin Liquid: Within functional limits Presentation: Straw    Nectar Thick Nectar Thick Liquid: Not tested   Honey Thick Honey Thick Liquid: Not tested   Puree Puree: Within functional limits   Solid   GO    Solid: Within functional limits       Royce MacadamiaLisa Willis Mahira Petras M.Ed [employer] Pager [phone]  09/26/2013

## 2013-09-26 NOTE — Progress Notes (Signed)
DAILY PROGRESS NOTE  Subjective:  Delerious - responding to what appear to be auditory hallucinations. She is in 2 point restraints. No complaints of chest pain, however, this may be unreliable. On heparin and nitro gtts.   Objective:  Temp:  [98.1 F (36.7 C)-98.8 F (37.1 C)] 98.8 F (37.1 C) (07/16 0800) Pulse Rate:  [88-102] 102 (07/16 0543) Resp:  [14-26] 26 (07/16 0543) BP: (109-168)/(53-81) 168/53 mmHg (07/16 0543) SpO2:  [96 %-100 %] 100 % (07/16 0543) Weight:  [150 lb 2.1 oz (68.1 kg)] 150 lb 2.1 oz (68.1 kg) (07/16 0500) Weight change:   Intake/Output from previous day: 07/15 0701 - 07/16 0700 In: 342.3 [P.O.:120; I.V.:222.3] Out: 625 [Urine:625]  Intake/Output from this shift:    Medications: Current Facility-Administered Medications  Medication Dose Route Frequency Provider Last Rate Last Dose  . acetaminophen (TYLENOL) tablet 650 mg  650 mg Oral Q6H PRN Renette Butters, MD      . ALPRAZolam Duanne Moron) tablet 1 mg  1 mg Oral QID PRN Samella Parr, NP   1 mg at 09/26/13 1043  . amitriptyline (ELAVIL) tablet 100 mg  100 mg Oral QHS Shanda Howells, MD   100 mg at 09/25/13 2225  . amLODipine (NORVASC) tablet 2.5 mg  2.5 mg Oral Daily Shanda Howells, MD   2.5 mg at 09/26/13 0929  . antiseptic oral rinse (BIOTENE) solution 15 mL  15 mL Mouth Rinse BID Velvet Bathe, MD   15 mL at 09/26/13 0800  . aspirin EC tablet 162 mg  162 mg Oral BID Shanda Howells, MD   162 mg at 09/26/13 2423  . calcium-vitamin D (OSCAL WITH D) 500-200 MG-UNIT per tablet 1 tablet  1 tablet Oral Daily Shanda Howells, MD   1 tablet at 09/26/13 0929  . carvedilol (COREG) tablet 12.5 mg  12.5 mg Oral BID WC Thurnell Lose, MD   12.5 mg at 09/26/13 0700  . clopidogrel (PLAVIX) tablet 75 mg  75 mg Oral Daily Shanda Howells, MD   75 mg at 09/26/13 0929  . feeding supplement (ENSURE) (ENSURE) pudding 1 Container  1 Container Oral BID BM Velvet Bathe, MD   1 Container at 09/25/13 1109  . fentaNYL  (DURAGESIC - dosed mcg/hr) patch 25 mcg  25 mcg Transdermal Q72H Shanda Howells, MD   25 mcg at 53/61/44 3154  . folic acid (FOLVITE) tablet 0.5 mg  0.5 mg Oral Daily Shanda Howells, MD   0.5 mg at 09/26/13 0086  . furosemide (LASIX) tablet 40 mg  40 mg Oral Daily Thurnell Lose, MD   40 mg at 09/26/13 7619  . haloperidol lactate (HALDOL) injection 2 mg  2 mg Intravenous Once Samella Parr, NP      . heparin ADULT infusion 100 units/mL (25000 units/250 mL)  1,050 Units/hr Intravenous Continuous Juanda Chance Amend, RPH 10.5 mL/hr at 09/25/13 1854 1,050 Units/hr at 09/25/13 1854  . HYDROcodone-acetaminophen (NORCO/VICODIN) 5-325 MG per tablet 1 tablet  1 tablet Oral Q4H PRN Thurnell Lose, MD   1 tablet at 09/25/13 1709  . insulin aspart (novoLOG) injection 0-9 Units  0-9 Units Subcutaneous 6 times per day Samella Parr, NP      . insulin glargine (LANTUS) injection 10 Units  10 Units Subcutaneous Once Luke K Kilroy, PA-C      . insulin glargine (LANTUS) injection 20 Units  20 Units Subcutaneous Daily Samella Parr, NP   20 Units at 09/26/13 1043   And  .  insulin glargine (LANTUS) injection 10 Units  10 Units Subcutaneous QHS Samella Parr, NP      . insulin glargine (LANTUS) injection 10 Units  10 Units Subcutaneous Once Samella Parr, NP      . iron polysaccharides (NIFEREX) capsule 150 mg  150 mg Oral Daily Shanda Howells, MD   150 mg at 09/26/13 0929  . latanoprost (XALATAN) 0.005 % ophthalmic solution 1 drop  1 drop Both Eyes QHS Shanda Howells, MD   1 drop at 09/26/13 0934  . LORazepam (ATIVAN) injection 0.5 mg  0.5 mg Intravenous Once Gardiner Barefoot, NP      . metoprolol (LOPRESSOR) injection 5 mg  5 mg Intravenous Q4H PRN Thurnell Lose, MD      . nitroGLYCERIN (NITROSTAT) SL tablet 0.4 mg  0.4 mg Sublingual Q5 min PRN Shanda Howells, MD   0.4 mg at 09/25/13 1157  . nitroGLYCERIN 0.2 mg/mL in dextrose 5 % infusion  5 mcg/min Intravenous Titrated Erlene Quan, PA-C 1.5 mL/hr  at 09/25/13 1855 5 mcg/min at 09/25/13 1855  . ondansetron (ZOFRAN) injection 4 mg  4 mg Intravenous Q6H PRN Velvet Bathe, MD   4 mg at 09/23/13 1059  . pantoprazole (PROTONIX) EC tablet 40 mg  40 mg Oral QAC breakfast Shanda Howells, MD   40 mg at 09/26/13 0840  . polyvinyl alcohol (LIQUIFILM TEARS) 1.4 % ophthalmic solution 2 drop  2 drop Both Eyes PRN Thurnell Lose, MD   2 drop at 09/24/13 0209  . potassium chloride (K-DUR,KLOR-CON) CR tablet 30 mEq  30 mEq Oral Daily Shanda Howells, MD   30 mEq at 09/26/13 0928  . predniSONE (DELTASONE) tablet 10 mg  10 mg Oral Q breakfast Velvet Bathe, MD   10 mg at 09/26/13 0700  . sertraline (ZOLOFT) tablet 50 mg  50 mg Oral Daily Shanda Howells, MD   50 mg at 09/26/13 0929  . sodium chloride 0.9 % bolus 250 mL  250 mL Intravenous PRN Thurnell Lose, MD   250 mL at 09/22/13 0017  . sodium chloride 0.9 % injection 3 mL  3 mL Intravenous Q12H Shanda Howells, MD   3 mL at 09/25/13 1107  . timolol (TIMOPTIC) 0.25 % ophthalmic solution 1 drop  1 drop Both Eyes Daily Shanda Howells, MD   1 drop at 09/26/13 1044    Physical Exam: General appearance: Delerious, not redirectable Neck: no carotid bruit and no JVD Lungs: diminished breath sounds bilaterally Heart: regular tachycardia Abdomen: soft, non-tender; bowel sounds normal; no masses,  no organomegaly Extremities: extremities normal, atraumatic, no cyanosis or edema Pulses: 2+ and symmetric Skin: Skin color, texture, turgor normal. No rashes or lesions Neurologic: Mental status: Delerious .  Lab Results: Results for orders placed during the hospital encounter of 09/18/13 (from the past 48 hour(s))  GLUCOSE, CAPILLARY     Status: Abnormal   Collection Time    09/24/13 11:13 AM      Result Value Ref Range   Glucose-Capillary 158 (*) 70 - 99 mg/dL  GLUCOSE, CAPILLARY     Status: Abnormal   Collection Time    09/24/13  4:59 PM      Result Value Ref Range   Glucose-Capillary 341 (*) 70 - 99 mg/dL    GLUCOSE, CAPILLARY     Status: Abnormal   Collection Time    09/24/13  9:38 PM      Result Value Ref Range   Glucose-Capillary 186 (*) 70 - 99  mg/dL   Comment 1 Notify RN    GLUCOSE, CAPILLARY     Status: Abnormal   Collection Time    09/25/13  2:24 AM      Result Value Ref Range   Glucose-Capillary 403 (*) 70 - 99 mg/dL   Comment 1 Notify RN    CBC     Status: Abnormal   Collection Time    09/25/13  2:57 AM      Result Value Ref Range   WBC 11.6 (*) 4.0 - 10.5 K/uL   RBC 3.30 (*) 3.87 - 5.11 MIL/uL   Hemoglobin 9.8 (*) 12.0 - 15.0 g/dL   HCT 29.4 (*) 36.0 - 46.0 %   MCV 89.1  78.0 - 100.0 fL   MCH 29.7  26.0 - 34.0 pg   MCHC 33.3  30.0 - 36.0 g/dL   RDW 12.8  11.5 - 15.5 %   Platelets 443 (*) 150 - 400 K/uL  TROPONIN I     Status: None   Collection Time    09/25/13  2:57 AM      Result Value Ref Range   Troponin I <0.30  <0.30 ng/mL   Comment:            Due to the release kinetics of cTnI,     a negative result within the first hours     of the onset of symptoms does not rule out     myocardial infarction with certainty.     If myocardial infarction is still suspected,     repeat the test at appropriate intervals.  GLUCOSE, CAPILLARY     Status: Abnormal   Collection Time    09/25/13  4:14 AM      Result Value Ref Range   Glucose-Capillary 482 (*) 70 - 99 mg/dL  GLUCOSE, CAPILLARY     Status: Abnormal   Collection Time    09/25/13  6:04 AM      Result Value Ref Range   Glucose-Capillary 400 (*) 70 - 99 mg/dL  GLUCOSE, CAPILLARY     Status: Abnormal   Collection Time    09/25/13  7:51 AM      Result Value Ref Range   Glucose-Capillary 338 (*) 70 - 99 mg/dL  TROPONIN I     Status: Abnormal   Collection Time    09/25/13  8:55 AM      Result Value Ref Range   Troponin I 3.21 (*) <0.30 ng/mL   Comment:            Due to the release kinetics of cTnI,     a negative result within the first hours     of the onset of symptoms does not rule out     myocardial  infarction with certainty.     If myocardial infarction is still suspected,     repeat the test at appropriate intervals.     CRITICAL RESULT CALLED TO, READ BACK BY AND VERIFIED WITH:     HALL,B RN @ 1000 09/25/13 LEONARD,A  GLUCOSE, CAPILLARY     Status: Abnormal   Collection Time    09/25/13 12:05 PM      Result Value Ref Range   Glucose-Capillary 285 (*) 70 - 99 mg/dL  TROPONIN I     Status: Abnormal   Collection Time    09/25/13  2:24 PM      Result Value Ref Range   Troponin I 4.52 (*) <0.30 ng/mL   Comment:  Due to the release kinetics of cTnI,     a negative result within the first hours     of the onset of symptoms does not rule out     myocardial infarction with certainty.     If myocardial infarction is still suspected,     repeat the test at appropriate intervals.     CRITICAL VALUE NOTED.  VALUE IS CONSISTENT WITH PREVIOUSLY REPORTED AND CALLED VALUE.  URINALYSIS, ROUTINE W REFLEX MICROSCOPIC     Status: Abnormal   Collection Time    09/25/13  4:15 PM      Result Value Ref Range   Color, Urine YELLOW  YELLOW   APPearance CLEAR  CLEAR   Specific Gravity, Urine 1.011  1.005 - 1.030   pH 7.0  5.0 - 8.0   Glucose, UA 500 (*) NEGATIVE mg/dL   Hgb urine dipstick NEGATIVE  NEGATIVE   Bilirubin Urine NEGATIVE  NEGATIVE   Ketones, ur NEGATIVE  NEGATIVE mg/dL   Protein, ur NEGATIVE  NEGATIVE mg/dL   Urobilinogen, UA 0.2  0.0 - 1.0 mg/dL   Nitrite NEGATIVE  NEGATIVE   Leukocytes, UA SMALL (*) NEGATIVE  HEPARIN LEVEL (UNFRACTIONATED)     Status: None   Collection Time    09/25/13  4:15 PM      Result Value Ref Range   Heparin Unfractionated 0.55  0.30 - 0.70 IU/mL   Comment:            IF HEPARIN RESULTS ARE BELOW     EXPECTED VALUES, AND PATIENT     DOSAGE HAS BEEN CONFIRMED,     SUGGEST FOLLOW UP TESTING     OF ANTITHROMBIN III LEVELS.  URINE MICROSCOPIC-ADD ON     Status: Abnormal   Collection Time    09/25/13  4:15 PM      Result Value Ref Range    Squamous Epithelial / LPF FEW (*) RARE   WBC, UA 3-6  <3 WBC/hpf   RBC / HPF 0-2  <3 RBC/hpf   Bacteria, UA RARE  RARE  GLUCOSE, CAPILLARY     Status: Abnormal   Collection Time    09/25/13  5:20 PM      Result Value Ref Range   Glucose-Capillary 419 (*) 70 - 99 mg/dL   Comment 1 Notify RN    TROPONIN I     Status: Abnormal   Collection Time    09/25/13  8:20 PM      Result Value Ref Range   Troponin I 6.94 (*) <0.30 ng/mL   Comment:            Due to the release kinetics of cTnI,     a negative result within the first hours     of the onset of symptoms does not rule out     myocardial infarction with certainty.     If myocardial infarction is still suspected,     repeat the test at appropriate intervals.     CRITICAL VALUE NOTED.  VALUE IS CONSISTENT WITH PREVIOUSLY REPORTED AND CALLED VALUE.  GLUCOSE, CAPILLARY     Status: Abnormal   Collection Time    09/25/13  8:36 PM      Result Value Ref Range   Glucose-Capillary 239 (*) 70 - 99 mg/dL   Comment 1 Notify RN     Comment 2 Documented in Chart    GLUCOSE, CAPILLARY     Status: Abnormal   Collection Time  09/25/13 10:07 PM      Result Value Ref Range   Glucose-Capillary 104 (*) 70 - 99 mg/dL  GLUCOSE, CAPILLARY     Status: Abnormal   Collection Time    09/25/13 11:48 PM      Result Value Ref Range   Glucose-Capillary 62 (*) 70 - 99 mg/dL  GLUCOSE, CAPILLARY     Status: Abnormal   Collection Time    09/26/13 12:25 AM      Result Value Ref Range   Glucose-Capillary 165 (*) 70 - 99 mg/dL  CBC     Status: Abnormal   Collection Time    09/26/13  1:39 AM      Result Value Ref Range   WBC 17.2 (*) 4.0 - 10.5 K/uL   RBC 3.90  3.87 - 5.11 MIL/uL   Hemoglobin 12.0  12.0 - 15.0 g/dL   Comment: DELTA CHECK NOTED     REPEATED TO VERIFY   HCT 34.8 (*) 36.0 - 46.0 %   MCV 89.2  78.0 - 100.0 fL   MCH 30.8  26.0 - 34.0 pg   MCHC 34.5  30.0 - 36.0 g/dL   RDW 12.9  11.5 - 15.5 %   Platelets 551 (*) 150 - 400 K/uL  BASIC  METABOLIC PANEL     Status: Abnormal   Collection Time    09/26/13  1:39 AM      Result Value Ref Range   Sodium 141  137 - 147 mEq/L   Potassium 4.1  3.7 - 5.3 mEq/L   Chloride 95 (*) 96 - 112 mEq/L   CO2 26  19 - 32 mEq/L   Glucose, Bld 167 (*) 70 - 99 mg/dL   BUN 16  6 - 23 mg/dL   Creatinine, Ser 0.87  0.50 - 1.10 mg/dL   Calcium 10.2  8.4 - 10.5 mg/dL   GFR calc non Af Amer 70 (*) >90 mL/min   GFR calc Af Amer 82 (*) >90 mL/min   Comment: (NOTE)     The eGFR has been calculated using the CKD EPI equation.     This calculation has not been validated in all clinical situations.     eGFR's persistently <90 mL/min signify possible Chronic Kidney     Disease.   Anion gap 20 (*) 5 - 15  TROPONIN I     Status: Abnormal   Collection Time    09/26/13  1:39 AM      Result Value Ref Range   Troponin I 5.80 (*) <0.30 ng/mL   Comment:            Due to the release kinetics of cTnI,     a negative result within the first hours     of the onset of symptoms does not rule out     myocardial infarction with certainty.     If myocardial infarction is still suspected,     repeat the test at appropriate intervals.     CRITICAL VALUE NOTED.  VALUE IS CONSISTENT WITH PREVIOUSLY REPORTED AND CALLED VALUE.  HEPARIN LEVEL (UNFRACTIONATED)     Status: None   Collection Time    09/26/13  1:39 AM      Result Value Ref Range   Heparin Unfractionated 0.47  0.30 - 0.70 IU/mL   Comment:            IF HEPARIN RESULTS ARE BELOW     EXPECTED VALUES, AND PATIENT     DOSAGE  HAS BEEN CONFIRMED,     SUGGEST FOLLOW UP TESTING     OF ANTITHROMBIN III LEVELS.  GLUCOSE, CAPILLARY     Status: Abnormal   Collection Time    09/26/13  4:19 AM      Result Value Ref Range   Glucose-Capillary 274 (*) 70 - 99 mg/dL  GLUCOSE, CAPILLARY     Status: Abnormal   Collection Time    09/26/13  7:50 AM      Result Value Ref Range   Glucose-Capillary 394 (*) 70 - 99 mg/dL    Imaging: Ct Head Wo Contrast  09/25/2013    CLINICAL DATA:  Decreased mentation  EXAM: CT HEAD WITHOUT CONTRAST  TECHNIQUE: Contiguous axial images were obtained from the base of the skull through the vertex without intravenous contrast.  COMPARISON:  10/09/2012  FINDINGS: Mild to moderate diffuse atrophy. No hydrocephalus. No evidence of hemorrhage, infarct, mass, or extra-axial fluid. Calvarium is intact.  IMPRESSION: No acute findings   Electronically Signed   By: Skipper Cliche M.D.   On: 09/25/2013 22:12   Dg Chest Port 1 View  09/25/2013   CLINICAL DATA:  63 year old female with chest pain cough altered mental status. Recent hip surgery. Initial encounter.  EXAM: PORTABLE CHEST - 1 VIEW  COMPARISON:  09/18/2013 and earlier.  FINDINGS: Portable AP semi upright view at 1059 hrs. More lordotic view. Stable cardiac size and mediastinal contours. Mildly lower lung volumes. Increased pulmonary interstitial opacity with slight basilar predominance. No pneumothorax, pleural effusion or consolidation.  IMPRESSION: Increased pulmonary interstitial opacity. Differential considerations: Developing interstitial edema versus viral/atypical respiratory infection.   Electronically Signed   By: Lars Pinks M.D.   On: 09/25/2013 11:10    Assessment:  1. Principal Problem: 2.   NSTEMI (non-ST elevated myocardial infarction) 3. Active Problems: 4.   Diabetes mellitus type 1, uncontrolled, insulin dependent 5.   Polymyalgia rheumatica 6.   HTN (hypertension) 7.   CAD- multiple PCIs 8.   Hearing loss sensory, bilateral 9.   Hip fracture- surgery 09/19/13 10.   Elevated troponin- 0.41 post op hip 11.   Anxiety 12.   Cardiomyopathy, ischemic- EF 35-40% at cath Oct 2014 13.   Plan:  1. Mrs. Rill is delirious today, probably a combination of medications, post-trauma/surgery, etc.. Her troponin peaked at 6.94 and is coming down with medical therapy. No active chest pain complaints today. Her last cath demonstrated diffuse CAD without clear percutaneous or  surgical targets - she was not considered an operative candidate.  I had a discussion today with her friend who also spoke with Dr. Luan Pulling, her PCP. We are in agreement not to pursue cardiac catheterization - I would continue medical therapy, heparin for a total of 48 hours and wean off nitro gtts.  Goal should be to focus on treatment of delerium and agitation.  We should consider a DNR status if she is more capable to make that decision at some point.  Time Spent Directly with Patient:  30 minutes  Length of Stay:  LOS: 8 days   Pixie Casino, MD, [facility] Attending Cardiologist [facility]  HILTY,Kenneth C 09/26/2013, 11:11 AM

## 2013-09-26 NOTE — Clinical Social Work Note (Signed)
Clinical Social Worker continuing to follow patient and family for support and discharge planning needs.  Per MD note, "Delerious - responding to what appear to be auditory hallucinations. She is in 2 point restraints."  [facility] and insurance authorization received.  CSW to update facility on patient current status.  CSW remains available for support and to facilitate patient discharge needs once medically ready.  Macario GoldsJesse Wetzel [place]ester, [place] [phone]

## 2013-09-26 NOTE — Progress Notes (Signed)
ANTICOAGULATION CONSULT NOTE - Follow Up Consult  Pharmacy Consult for heparin Indication: nstemi  Allergies  Allergen Reactions  . Codeine     Unknown  . Flagyl [Metronidazole]     Unknown  . Fluocinonide     Unknown  . Ranexa [Ranolazine] Other (See Comments)    Elevated liver function  . Statins Other (See Comments)    Muscle aches  . Biaxin [Clarithromycin] Rash  . Demerol [Meperidine] Rash    Patient Measurements: Height: 5\' 4"  (162.6 cm) Weight: 150 lb 2.1 oz (68.1 kg) IBW/kg (Calculated) : 54.7 Heparin Dosing Weight:   Vital Signs: Temp: 98.1 F (36.7 C) (07/16 0543) Temp src: Oral (07/15 2300) BP: 168/53 mmHg (07/16 0543) Pulse Rate: 102 (07/16 0543)  Labs:  Recent Labs  09/24/13 0340 09/25/13 0257  09/25/13 1424 09/25/13 1615 09/25/13 2020 09/26/13 0139  HGB 9.5* 9.8*  --   --   --   --  12.0  HCT 28.0* 29.4*  --   --   --   --  34.8*  PLT 427* 443*  --   --   --   --  551*  HEPARINUNFRC  --   --   --   --  0.55  --  0.47  CREATININE  --   --   --   --   --   --  0.87  TROPONINI  --  <0.30  < > 4.52*  --  6.94* 5.80*  < > = values in this interval not displayed.  Estimated Creatinine Clearance: 64.4 ml/min (by C-G formula based on Cr of 0.87).  Assessment:  Heparin infusing at 1050 units/hr. Not sure about cath plan yet. Troponin is elevated.    Goal of Therapy:  Heparin level 0.3-0.7 units/ml Monitor platelets by anticoagulation protocol: Yes   Plan:   Continue at 1050/hr F/u with cath plan  Ulyses [place], Havyn Ramo [facility] 09/26/2013,9:33 AM

## 2013-09-27 DIAGNOSIS — D72829 Elevated white blood cell count, unspecified: Secondary | ICD-10-CM

## 2013-09-27 DIAGNOSIS — I5033 Acute on chronic diastolic (congestive) heart failure: Secondary | ICD-10-CM

## 2013-09-27 DIAGNOSIS — I509 Heart failure, unspecified: Secondary | ICD-10-CM

## 2013-09-27 LAB — COMPREHENSIVE METABOLIC PANEL WITH GFR
ALT: 13 U/L (ref 0–35)
AST: 19 U/L (ref 0–37)
Albumin: 3.4 g/dL — ABNORMAL LOW (ref 3.5–5.2)
Alkaline Phosphatase: 80 U/L (ref 39–117)
Anion gap: 14 (ref 5–15)
BUN: 26 mg/dL — ABNORMAL HIGH (ref 6–23)
CO2: 30 meq/L (ref 19–32)
Calcium: 9.3 mg/dL (ref 8.4–10.5)
Chloride: 97 meq/L (ref 96–112)
Creatinine, Ser: 1.06 mg/dL (ref 0.50–1.10)
GFR calc Af Amer: 64 mL/min — ABNORMAL LOW (ref >90)
GFR calc non Af Amer: 55 mL/min — ABNORMAL LOW (ref >90)
Glucose, Bld: 88 mg/dL (ref 70–99)
Potassium: 3.4 meq/L — ABNORMAL LOW (ref 3.7–5.3)
Sodium: 141 meq/L (ref 137–147)
Total Bilirubin: 0.3 mg/dL (ref 0.3–1.2)
Total Protein: 7.3 g/dL (ref 6.0–8.3)

## 2013-09-27 LAB — GLUCOSE, CAPILLARY
GLUCOSE-CAPILLARY: 117 mg/dL — AB (ref 70–99)
GLUCOSE-CAPILLARY: 229 mg/dL — AB (ref 70–99)
GLUCOSE-CAPILLARY: 527 mg/dL — AB (ref 70–99)
GLUCOSE-CAPILLARY: 90 mg/dL (ref 70–99)
Glucose-Capillary: 189 mg/dL — ABNORMAL HIGH (ref 70–99)
Glucose-Capillary: 329 mg/dL — ABNORMAL HIGH (ref 70–99)
Glucose-Capillary: 288 mg/dL — ABNORMAL HIGH (ref 70–99)
Glucose-Capillary: 272 mg/dL — ABNORMAL HIGH (ref 70–99)

## 2013-09-27 LAB — CBC
HCT: 33.2 % — ABNORMAL LOW (ref 36.0–46.0)
Hemoglobin: 11.2 g/dL — ABNORMAL LOW (ref 12.0–15.0)
MCH: 30.1 pg (ref 26.0–34.0)
MCHC: 33.7 g/dL (ref 30.0–36.0)
MCV: 89.2 fL (ref 78.0–100.0)
Platelets: 539 10*3/uL — ABNORMAL HIGH (ref 150–400)
RBC: 3.72 MIL/uL — AB (ref 3.87–5.11)
RDW: 13.5 % (ref 11.5–15.5)
WBC: 19.4 10*3/uL — AB (ref 4.0–10.5)

## 2013-09-27 LAB — URINE CULTURE
COLONY COUNT: NO GROWTH
Culture: NO GROWTH

## 2013-09-27 LAB — HEPARIN LEVEL (UNFRACTIONATED): HEPARIN UNFRACTIONATED: 0.75 [IU]/mL — AB (ref 0.30–0.70)

## 2013-09-27 MED ORDER — ISOSORBIDE MONONITRATE ER 60 MG PO TB24
60.0000 mg | ORAL_TABLET | Freq: Two times a day (BID) | ORAL | Status: DC
  Administered 2013-09-27 – 2013-10-01 (×9): 60 mg via ORAL
  Filled 2013-09-27 (×12): qty 1

## 2013-09-27 MED ORDER — PREDNISONE 5 MG PO TABS
5.0000 mg | ORAL_TABLET | Freq: Every day | ORAL | Status: DC
  Administered 2013-09-28: 5 mg via ORAL
  Filled 2013-09-27 (×2): qty 1

## 2013-09-27 MED ORDER — PREDNISONE 10 MG PO TABS
10.0000 mg | ORAL_TABLET | Freq: Once | ORAL | Status: AC
  Administered 2013-09-27: 10 mg via ORAL
  Filled 2013-09-27: qty 1

## 2013-09-27 NOTE — Clinical Social Work Note (Signed)
Clinical Social Worker continuing to follow patient and family for support and discharge planning needs.  CSW spoke with facility who states that patient will have a bed available on Monday 09/30/13 if medically cleared.  Per MD notes, hopeful that patient will be medically cleared by Monday.  Patient still with confusion and delirium and remains in restraints.  CSW remains available for support and to facilitate patient discharge needs once medically ready.  Macario GoldsJesse Shmiel [place], [place] [phone]

## 2013-09-27 NOTE — Progress Notes (Signed)
ANTICOAGULATION CONSULT NOTE - Follow Up Consult  Pharmacy Consult for heparin Indication: NSTEMI  Labs:  Recent Labs  09/25/13 0257  09/25/13 1424 09/25/13 1615 09/25/13 2020 09/26/13 0139 09/27/13 0411  HGB 9.8*  --   --   --   --  12.0 11.2*  HCT 29.4*  --   --   --   --  34.8* 33.2*  PLT 443*  --   --   --   --  551* 539*  HEPARINUNFRC  --   --   --  0.55  --  0.47 0.75*  CREATININE  --   --   --   --   --  0.87  --   TROPONINI <0.30  < > 4.52*  --  6.94* 5.80*  --   < > = values in this interval not displayed.   Assessment: 63yo female supratherapeutic on heparin after being stable on heparin, lab drawn correctly per RN.  Goal of Therapy:  Heparin level 0.3-0.7 units/ml   Plan:  Will decrease heparin gtt by 1-2 units/kg/hr to 950 units/hr and check level in ~6hr.  Vernard GamblesVeronda Ruey Storer, PharmD, BCPS  09/27/2013,5:12 AM

## 2013-09-27 NOTE — Progress Notes (Signed)
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 She became slightly anxious when up walking and refused to go farther than the chair. Once seated, stated her pain was increased with standing. Pt very motivated to improve and is looking forward to moving to rehab/SNF on Monday.   Follow Up Recommendations  SNF     Equipment Recommendations  Rolling walker with 5" wheels;3in1 (PT)    Recommendations for Other Services       Precautions / Restrictions Precautions Precautions: Fall Restrictions Weight Bearing Restrictions: No RLE Weight Bearing: Weight bearing as tolerated    Mobility  Bed Mobility Overal bed mobility: Needs Assistance Bed Mobility: Supine to Sit     Supine to sit: Min assist;HOB elevated     General bed mobility comments: with rail, HOB 20; pt required assist to raise her torso due to weakness  Transfers Overall transfer level: Needs assistance Equipment used: Rolling walker (2 wheeled) Transfers: Sit to/from Stand Sit to Stand: Min assist         General transfer comment: x2: vc for safe use of RW; vc and min assist for scooting to edge of surface and to scoot back into chair  Ambulation/Gait Ambulation/Gait assistance: Min assist;+2 safety/equipment Psychiatrist(sitter assisted with lines) Ambulation Distance (Feet): 5 Feet Assistive device: Rolling walker (2 wheeled) Gait Pattern/deviations: Step-to pattern;Decreased stride length;Decreased stance time - right;Shuffle     General Gait Details: moving slowly; did not  want to try to ambulate more than to the chair (anxious)   Stairs            Wheelchair Mobility    Modified Rankin (Stroke Patients Only)       Balance Overall balance assessment: History of Falls Sitting-balance support: No upper extremity supported;Feet supported Sitting balance-Leahy Scale: Fair     Standing balance support: Bilateral upper extremity supported Standing balance-Leahy Scale: Poor                      Cognition Arousal/Alertness: Awake/alert Behavior During Therapy: WFL for tasks assessed/performed Overall Cognitive Status: Impaired/Different from baseline Area of Impairment: Orientation;Following commands Orientation Level: Time   Memory: Decreased short-term memory Following Commands: Follows one step commands with increased time            Exercises General Exercises - Lower Extremity Ankle Circles/Pumps: Both;10 reps Long Arc Quad: Both;10 reps Hip Flexion/Marching: Both;10 reps;Seated Toe Raises: Both;5 reps;Standing    General Comments        Pertinent Vitals/Pain Pt unable to state a number for Rt hip pain "it's at the worst part of the scale"; patient repositioned for comfort .    Home Living                      Prior Function            PT Goals (current goals can now be found in the care plan section) Acute Rehab PT Goals Patient Stated Goal: to go to rehab and then home Progress towards PT goals: Progressing toward goals  Frequency  Min 5X/week    PT Plan Current plan remains appropriate    Co-evaluation             End of Session   Activity Tolerance: Patient tolerated treatment well Patient left: in chair;with call bell/phone within reach;with nursing/sitter in room     Time: 1134-1203 PT Time Calculation (min): 29 min  Charges:  $Gait Training: 8-22 mins $Therapeutic Exercise: 8-22 mins                    G Codes:      Kloey Cazarez September 29, 2013, 12:19 PM Pager  [phone]

## 2013-09-27 NOTE — Progress Notes (Signed)
Patient Name: [name] Date of Encounter: 09/27/2013     Active Problems:   NSTEMI (non-ST elevated myocardial infarction)   Diabetes mellitus type 1, uncontrolled, insulin dependent   Polymyalgia rheumatica   HTN (hypertension)   CAD- multiple PCIs   Hip fracture- surgery 09/19/13   Elevated troponin- 0.41 post op hip   Anxiety   Cardiomyopathy, ischemic- EF 35-40% at cath Oct 2014   Acute delirium   Acute respiratory failure with hypoxia    SUBJECTIVE  The patient is in a good mood today.  Mentally she is much clearer.  She is out of restraints now.  She denies any chest pain or increased dyspnea.  Her heart rhythm is normal sinus rhythm.  She is looking forward to being transferred to skilled nursing facility on Monday for rehabilitation.  CURRENT MEDS . amitriptyline  100 mg Oral QHS  . amLODipine  2.5 mg Oral Daily  . antiseptic oral rinse  15 mL Mouth Rinse BID  . aspirin EC  162 mg Oral BID  . calcium-vitamin D  1 tablet Oral Daily  . carvedilol  12.5 mg Oral BID WC  . clopidogrel  75 mg Oral Daily  . feeding supplement (ENSURE)  1 Container Oral BID BM  . fentaNYL  25 mcg Transdermal Q72H  . folic acid  0.5 mg Oral Daily  . furosemide  40 mg Oral Daily  . haloperidol lactate  2 mg Intravenous Once  . insulin aspart  0-9 Units Subcutaneous 6 times per day  . insulin glargine  10 Units Subcutaneous Once  . insulin glargine  20 Units Subcutaneous Daily   And  . insulin glargine  10 Units Subcutaneous QHS  . insulin glargine  10 Units Subcutaneous Once  . iron polysaccharides  150 mg Oral Daily  . latanoprost  1 drop Both Eyes QHS  . LORazepam  0.5 mg Intravenous Once  . pantoprazole  40 mg Oral QAC breakfast  . potassium chloride SA  30 mEq Oral Daily  . predniSONE  10 mg Oral Once   Followed by  . [START ON 09/28/2013] predniSONE  5 mg Oral Q breakfast  . sertraline  50 mg Oral Daily  . sodium chloride  3 mL Intravenous Q12H  . timolol  1 drop Both  Eyes Daily    OBJECTIVE  Filed Vitals:   09/26/13 2330 09/27/13 0432 09/27/13 0500 09/27/13 0800  BP: 132/54 120/49  136/46  Pulse: 81 69  80  Temp: 97.9 F (36.6 C) 98.5 F (36.9 C)  97.3 F (36.3 C)  TempSrc: Oral Oral  Oral  Resp: 15 15  22   Height:      Weight:   153 lb 3.5 oz (69.5 kg)   SpO2: 98% 99%  97%    Intake/Output Summary (Last 24 hours) at 09/27/13 1018 Last data filed at 09/27/13 0700  Gross per 24 hour  Intake    444 ml  Output   2250 ml  Net  -1806 ml   Filed Weights   09/25/13 0715 09/26/13 0500 09/27/13 0500  Weight: 165 lb 5.5 oz (75 kg) 150 lb 2.1 oz (68.1 kg) 153 lb 3.5 oz (69.5 kg)    PHYSICAL EXAM  General: Pleasant, NAD. Neuro: Alert and oriented X 3. Moves all extremities spontaneously. Psych: Normal affect. HEENT:  Normal  Neck: Supple without bruits or JVD. Lungs:  Resp regular and unlabored, CTA. Heart: RRR no s3, s4, or murmurs. Abdomen: Soft, non-tender,  non-distended, BS + x 4.  Extremities: No clubbing, cyanosis or edema. DP/PT/Radials 2+ and equal bilaterally.  Accessory Clinical Findings  CBC  Recent Labs  09/26/13 0139 09/27/13 0411  WBC 17.2* 19.4*  HGB 12.0 11.2*  HCT 34.8* 33.2*  MCV 89.2 89.2  PLT 551* 539*   Basic Metabolic Panel  Recent Labs  09/26/13 0139 09/27/13 0411  NA 141 141  K 4.1 3.4*  CL 95* 97  CO2 26 30  GLUCOSE 167* 88  BUN 16 26*  CREATININE 0.87 1.06  CALCIUM 10.2 9.3   Liver Function Tests  Recent Labs  09/27/13 0411  AST 19  ALT 13  ALKPHOS 80  BILITOT 0.3  PROT 7.3  ALBUMIN 3.4*   No results found for this basename: LIPASE, AMYLASE,  in the last 72 hours Cardiac Enzymes  Recent Labs  09/25/13 1424 09/25/13 2020 09/26/13 0139  TROPONINI 4.52* 6.94* 5.80*   BNP No components found with this basename: POCBNP,  D-Dimer No results found for this basename: DDIMER,  in the last 72 hours Hemoglobin A1C No results found for this basename: HGBA1C,  in the last 72  hours Fasting Lipid Panel No results found for this basename: CHOL, HDL, LDLCALC, TRIG, CHOLHDL, LDLDIRECT,  in the last 72 hours Thyroid Function Tests No results found for this basename: TSH, T4TOTAL, FREET3, T3FREE, THYROIDAB,  in the last 72 hours  TELE  Normal sinus rhythm  ECG  EKG pending  Radiology/Studies  Dg Hip Complete Right  09/18/2013   CLINICAL DATA:  Right hip pain secondary to a fall.  EXAM: RIGHT HIP - COMPLETE 2+ VIEW  COMPARISON:  None.  FINDINGS: There is a slightly impacted subcapital fracture or right femoral neck. No displacement.  Pelvic bones are intact.  IMPRESSION: Impacted subcapital fracture of the right femoral neck.   Electronically Signed   By: Geanie Cooley M.D.   On: 09/18/2013 18:59   Dg Hip Operative Right  09/19/2013   CLINICAL DATA:  Operative images from the ORIF of a right subcapital femoral neck fracture.  EXAM: DG OPERATIVE RIGHT HIP  TECHNIQUE: A single spot fluoroscopic AP image of the right hip is submitted.  COMPARISON:  09/18/2013  FINDINGS: Three screws reduce the subcapital fracture into near anatomic alignment. Mild residual valgus angulation is noted. Orthopedic hardware is well-seated. No evidence of a new fracture or operative complication.  IMPRESSION: ORIF of a right subcapital femoral neck fracture as described.   Electronically Signed   By: Amie Portland M.D.   On: 09/19/2013 17:41   Ct Head Wo Contrast  09/25/2013   CLINICAL DATA:  Decreased mentation  EXAM: CT HEAD WITHOUT CONTRAST  TECHNIQUE: Contiguous axial images were obtained from the base of the skull through the vertex without intravenous contrast.  COMPARISON:  10/09/2012  FINDINGS: Mild to moderate diffuse atrophy. No hydrocephalus. No evidence of hemorrhage, infarct, mass, or extra-axial fluid. Calvarium is intact.  IMPRESSION: No acute findings   Electronically Signed   By: Esperanza Heir M.D.   On: 09/25/2013 22:12   Dg Pelvis Portable  09/19/2013   CLINICAL DATA:  Postop  right hip.  EXAM: PORTABLE PELVIS 1-2 VIEWS  COMPARISON:  None.  FINDINGS: Three screws reduce a femoral neck fracture on the right. Fracture is in near anatomic alignment with mild valgus angulation. Orthopedic hardware is well-seated. No evidence of an operative complication.  IMPRESSION: Status post ORIF of a right femoral neck fracture.   Electronically Signed   By:  Amie Portlandavid  Ormond M.D.   On: 09/19/2013 18:21   Dg Chest Port 1 View  09/25/2013   CLINICAL DATA:  63 year old female with chest pain cough altered mental status. Recent hip surgery. Initial encounter.  EXAM: PORTABLE CHEST - 1 VIEW  COMPARISON:  09/18/2013 and earlier.  FINDINGS: Portable AP semi upright view at 1059 hrs. More lordotic view. Stable cardiac size and mediastinal contours. Mildly lower lung volumes. Increased pulmonary interstitial opacity with slight basilar predominance. No pneumothorax, pleural effusion or consolidation.  IMPRESSION: Increased pulmonary interstitial opacity. Differential considerations: Developing interstitial edema versus viral/atypical respiratory infection.   Electronically Signed   By: Augusto GambleLee  Hall M.D.   On: 09/25/2013 11:10   Dg Chest Port 1 View  09/18/2013   CLINICAL DATA:  Preop hip fracture.  Diabetes.  Hypertension.  EXAM: PORTABLE CHEST - 1 VIEW  COMPARISON:  01/07/2013  FINDINGS: Postoperative changes in the mediastinum. Normal heart size and pulmonary vascularity. Interstitial changes in the lungs probably representing fibrosis, similar to previous study no focal airspace disease. No blunting of costophrenic angles. No pneumothorax.  IMPRESSION: No active disease.   Electronically Signed   By: Burman NievesWilliam  Stevens M.D.   On: 09/18/2013 21:04    ASSESSMENT AND PLAN NSTEMI (non-ST elevated myocardial infarction)  Active Problems:  Diabetes mellitus type 1, uncontrolled, insulin dependent  Polymyalgia rheumatica  HTN (hypertension)  CAD- multiple PCIs  Hearing loss sensory, bilateral Hip fracture-  surgery 09/19/13  Elevated troponin- 0.41 post op hip  Anxiety  Cardiomyopathy, ischemic- EF 35-40% at cath Oct 2014  Plan: I will stop her IV heparin today.  Continue dual antiplatelet therapy with aspirin and Plavix.  We will stop the IV nitroglycerin and switch her back to her home dose of isosorbide mononitrate 60 mg twice a day Watch over the weekend and hopefully transfer to rehabilitation facility Monday.  Signed, Cassell Clementhomas Damar Petit MD

## 2013-09-27 NOTE — Progress Notes (Signed)
[facility]  STEHANIE EKSTROM ZOX:[id number] DOB: 1950/10/30 DOA: 09/18/2013 PCP: Fredirick Maudlin, MD   Brief narrative: 63 year old Caucasian female with history of CAD, mild systolic CHF, chronic pain. Admitted after mechanical fall causing right-sided femoral fracture. Evaluated by cardiology and orthopedics then underwent surgical correction by orthopedics on 09/19/2013. Blood pressure was slightly soft due to overuse of narcotics and due to relative adrenal insufficiency (chronic sterids due to PMR).  Ant-HTN meds were adjusted and she was switched to stress dose steroids, minimize narcotics.   She developed CP and SOB overnight into 7/15. She did have mild bump in troponin 7/15 consistent with NSTEMI.  EKG with inferolateral TW changes without elevation. Cardiology initiated a heparin drip and transferred to pt step down.  HPI/Subjective: Remains confused but certainly more oriented than yesterday  Assessment/Plan: Active Problems:   Anxiety/Acute delirium -multifactorial: prolonged hospitalization, recent stress dose steroids, recent change in narcotic dose, and suspected under dosing BZD's -used restraints as needed for safety -7/17: Much better today -7/16: Patient had significant delirium and had been resumed on home dose Xanax (1mg ) so MD decreased Xanax to 0.5 mg q 6hr PRN anxiety -7/16: Caregiver insisted patient receive full dose benzodiazepine and narcotics. MD Counseled caregiver this could actually be exacerbating her delirium/delusions in light of her acute health problems. -7/16: dc'd Dilaudid-cont'd Vicodin and home Fentanyl patch -Urinalysis mildly positive and culture pending-if truly has UTI this could be contributing to delirious state as well -in d/w pt partner [name] she mentioned that "the confusion was much worse this time" suggestive of prior issues with acute delirium during prior hospitalizations    NSTEMI /CAD- multiple  PCIs/Elevated troponin -Cards managing -TNI peaked 6.94 with slow trend down -planned cath dc'd with no plans to pursue in future -cath Oct 2014 with diffuse CAD but no amenable PCI targets so focus will be on medical rxn -cont BB, ASA, Plavix -7/17: Cards dc'd IV Heparin and began Imdur and dc'd IV NTG    Acute respiratory failure with hypoxia/Acute diastolic heart failure exacerbation -diuresing well on PO Lasix -Repeat echo this admission now shows normal LVEF of 55-60% with grade 1 diastolic dysfunction which is improved from systolic dysfunction seen on cardiac catheterization October 2014 at that time patient had an EF of 35-40% -incontinent of urine due to AMS so insert foley for accurate I/O and to protect skin -has weaned off oxygen this am   Leukocytosis -no fever or definitive source of an infection (see below re: UA) -suspect elevated WBC from NSTEMI and not infection but will follow closely      Diabetes mellitus type 1, uncontrolled, insulin dependent -Hgb A1c 10.4 -at home was on high am and low pm dose Lantus-titrate up as indicated -cont senstive SSI-pt very sensitive to insulin and given delirium likely cannot make staff aware if glucose dropping (at baseline she had that awareness)    Polymyalgia rheumatica on chronic steroids -back on Prednisone-partner clarified home dose was 2.5 mg- have tapered to 5 mg beginning 7/18 and consider further taper as well    HTN (hypertension) -controlled    Hip fracture- surgery 09/19/13 -PT recommends SNF for rehab -OT eval pending -per SW notes had previously been accepted to [facility] and if medically stable amy be ready by Monday   DVT prophylaxis: Convert to SQ heparin-pt with peripheral neuropathy and simple palpation of LEs caused pain so would not tolerate SCDs Code Status: Full Family Communication: Pt's  partner [name] at bedside Disposition Plan/Expected LOS: Stepdown until delirium  cleared   Consultants: Cardiology  Procedures: 2-D echocardiogram - Left ventricle: The cavity size was normal. Systolic function was normal. The estimated ejection fraction was in the range of 55% to 60%. Doppler parameters are consistent with abnormal left ventricular relaxation (grade 1 diastolic dysfunction). - Aortic valve: There was mild regurgitation.   Cultures: Urine culture pending  Antibiotics: None  Objective: Blood pressure 117/50, pulse 71, temperature 98.6 F (37 C), temperature source Oral, resp. rate 22, height 5\' 4"  (1.626 m), weight 153 lb 3.5 oz (69.5 kg), SpO2 94.00%.  Intake/Output Summary (Last 24 hours) at 09/27/13 1312 Last data filed at 09/27/13 1100  Gross per 24 hour  Intake  778.7 ml  Output   1375 ml  Net -596.3 ml     Exam: General: No acute respiratory distress Lungs: Clear to auscultation bilaterally without wheezes or crackles, RA Cardiovascular: Regular rate and rhythm without murmur gallop or rub normal S1 and S2, no peripheral edema or JVD Abdomen: Nontender, nondistended, soft, bowel sounds positive, no rebound, no ascites, no appreciable mass Musculoskeletal: No significant cyanosis, clubbing of bilateral lower extremities Neurological: Alert and more oriented although still inappropriate at times and has ST memory deficits  Scheduled Meds:  Scheduled Meds: . amitriptyline  100 mg Oral QHS  . amLODipine  2.5 mg Oral Daily  . antiseptic oral rinse  15 mL Mouth Rinse BID  . aspirin EC  162 mg Oral BID  . calcium-vitamin D  1 tablet Oral Daily  . carvedilol  12.5 mg Oral BID WC  . clopidogrel  75 mg Oral Daily  . feeding supplement (ENSURE)  1 Container Oral BID BM  . fentaNYL  25 mcg Transdermal Q72H  . folic acid  0.5 mg Oral Daily  . furosemide  40 mg Oral Daily  . haloperidol lactate  2 mg Intravenous Once  . insulin aspart  0-9 Units Subcutaneous 6 times per day  . insulin glargine  10 Units Subcutaneous Once  .  insulin glargine  20 Units Subcutaneous Daily   And  . insulin glargine  10 Units Subcutaneous QHS  . insulin glargine  10 Units Subcutaneous Once  . iron polysaccharides  150 mg Oral Daily  . isosorbide mononitrate  60 mg Oral BID  . latanoprost  1 drop Both Eyes QHS  . LORazepam  0.5 mg Intravenous Once  . pantoprazole  40 mg Oral QAC breakfast  . potassium chloride SA  30 mEq Oral Daily  . [START ON 09/28/2013] predniSONE  5 mg Oral Q breakfast  . sertraline  50 mg Oral Daily  . sodium chloride  3 mL Intravenous Q12H  . timolol  1 drop Both Eyes Daily   Continuous Infusions:    Data Reviewed: Basic Metabolic Panel:  Recent Labs Lab 09/21/13 0544 09/26/13 0139 09/27/13 0411  NA 137 141 141  K 4.0 4.1 3.4*  CL 97 95* 97  CO2 27 26 30   GLUCOSE 93 167* 88  BUN 15 16 26*  CREATININE 0.79 0.87 1.06  CALCIUM 8.6 10.2 9.3   Liver Function Tests:  Recent Labs Lab 09/27/13 0411  AST 19  ALT 13  ALKPHOS 80  BILITOT 0.3  PROT 7.3  ALBUMIN 3.4*   No results found for this basename: LIPASE, AMYLASE,  in the last 168 hours No results found for this basename: AMMONIA,  in the last 168 hours CBC:  Recent Labs Lab 09/23/13 0414  09/24/13 0340 09/25/13 0257 09/26/13 0139 09/27/13 0411  WBC 12.0* 9.6 11.6* 17.2* 19.4*  HGB 8.4* 9.5* 9.8* 12.0 11.2*  HCT 24.4* 28.0* 29.4* 34.8* 33.2*  MCV 87.5 90.3 89.1 89.2 89.2  PLT 395 427* 443* 551* 539*   Cardiac Enzymes:  Recent Labs Lab 09/25/13 0257 09/25/13 0855 09/25/13 1424 09/25/13 2020 09/26/13 0139  TROPONINI <0.30 3.21* 4.52* 6.94* 5.80*   BNP (last 3 results)  Recent Labs  09/18/13 2221  PROBNP 700.3*   CBG:  Recent Labs Lab 09/26/13 2152 09/27/13 0038 09/27/13 0434 09/27/13 0725 09/27/13 1214  GLUCAP 189* 229* 90 117* 527*    Recent Results (from the past 240 hour(s))  SURGICAL PCR SCREEN     Status: None   Collection Time    09/19/13  9:21 AM      Result Value Ref Range Status   MRSA,  PCR NEGATIVE  NEGATIVE Final   Staphylococcus aureus NEGATIVE  NEGATIVE Final   Comment:            The Xpert SA Assay (FDA     approved for NASAL specimens     in patients over 40 years of age),     is one component of     a comprehensive surveillance     program.  Test performance has     been validated by [employer] for patients greater     than or equal to 65 year old.     It is not intended     to diagnose infection nor to     guide or monitor treatment.     Studies:  Recent x-ray studies have been reviewed in detail by the Attending Physician  Time spent :      Junious Silk, ANP [facility] Office  [phone] Pager [phone]   **If unable to reach the above provider after paging please contact the Flow Manager @ [phone]  On-Call/Text Page:      [email or web address]      password [email or web address]  If 7PM-7AM, please contact night-coverage [email or web address] Password [email or web address] 09/27/2013, 1:12 PM   LOS: 9 days   Examining patient, discussed assessment and plan with ANP Revonda Standard and agree with the above plan. Patient with multiple complex medical problems time spent in direct patient care greater than 40 minutes.

## 2013-09-28 DIAGNOSIS — I2589 Other forms of chronic ischemic heart disease: Secondary | ICD-10-CM

## 2013-09-28 DIAGNOSIS — Z765 Malingerer [conscious simulation]: Secondary | ICD-10-CM | POA: Diagnosis present

## 2013-09-28 DIAGNOSIS — F1910 Other psychoactive substance abuse, uncomplicated: Secondary | ICD-10-CM

## 2013-09-28 DIAGNOSIS — R9431 Abnormal electrocardiogram [ECG] [EKG]: Secondary | ICD-10-CM | POA: Diagnosis present

## 2013-09-28 LAB — CBC
HCT: 30.2 % — ABNORMAL LOW (ref 36.0–46.0)
Hemoglobin: 10.3 g/dL — ABNORMAL LOW (ref 12.0–15.0)
MCH: 30.6 pg (ref 26.0–34.0)
MCHC: 34.1 g/dL (ref 30.0–36.0)
MCV: 89.6 fL (ref 78.0–100.0)
PLATELETS: 492 10*3/uL — AB (ref 150–400)
RBC: 3.37 MIL/uL — ABNORMAL LOW (ref 3.87–5.11)
RDW: 13.4 % (ref 11.5–15.5)
WBC: 14.4 10*3/uL — ABNORMAL HIGH (ref 4.0–10.5)

## 2013-09-28 LAB — GLUCOSE, CAPILLARY
GLUCOSE-CAPILLARY: 198 mg/dL — AB (ref 70–99)
Glucose-Capillary: 226 mg/dL — ABNORMAL HIGH (ref 70–99)
Glucose-Capillary: 233 mg/dL — ABNORMAL HIGH (ref 70–99)
Glucose-Capillary: 238 mg/dL — ABNORMAL HIGH (ref 70–99)
Glucose-Capillary: 259 mg/dL — ABNORMAL HIGH (ref 70–99)
Glucose-Capillary: 270 mg/dL — ABNORMAL HIGH (ref 70–99)
Glucose-Capillary: 281 mg/dL — ABNORMAL HIGH (ref 70–99)
Glucose-Capillary: 391 mg/dL — ABNORMAL HIGH (ref 70–99)

## 2013-09-28 LAB — BASIC METABOLIC PANEL
ANION GAP: 17 — AB (ref 5–15)
BUN: 28 mg/dL — ABNORMAL HIGH (ref 6–23)
CALCIUM: 8.8 mg/dL (ref 8.4–10.5)
CO2: 24 mEq/L (ref 19–32)
Chloride: 90 mEq/L — ABNORMAL LOW (ref 96–112)
Creatinine, Ser: 0.9 mg/dL (ref 0.50–1.10)
GFR calc Af Amer: 78 mL/min — ABNORMAL LOW (ref >90)
GFR, EST NON AFRICAN AMERICAN: 68 mL/min — AB (ref >90)
GLUCOSE: 230 mg/dL — AB (ref 70–99)
Potassium: 4 mEq/L (ref 3.7–5.3)
Sodium: 131 mEq/L — ABNORMAL LOW (ref 137–147)

## 2013-09-28 MED ORDER — BISACODYL 10 MG RE SUPP
10.0000 mg | Freq: Two times a day (BID) | RECTAL | Status: DC | PRN
  Administered 2013-09-28: 10 mg via RECTAL
  Filled 2013-09-28: qty 1

## 2013-09-28 MED ORDER — AMITRIPTYLINE HCL 75 MG PO TABS
75.0000 mg | ORAL_TABLET | Freq: Every day | ORAL | Status: DC
Start: 2013-09-28 — End: 2013-10-01
  Administered 2013-09-28 – 2013-09-30 (×3): 75 mg via ORAL
  Filled 2013-09-28 (×4): qty 1

## 2013-09-28 MED ORDER — INSULIN GLARGINE 100 UNIT/ML ~~LOC~~ SOLN
25.0000 [IU] | Freq: Every day | SUBCUTANEOUS | Status: DC
  Administered 2013-09-29: 25 [IU] via SUBCUTANEOUS
  Filled 2013-09-28: qty 0.25

## 2013-09-28 MED ORDER — HEPARIN SODIUM (PORCINE) 5000 UNIT/ML IJ SOLN
5000.0000 [IU] | Freq: Three times a day (TID) | INTRAMUSCULAR | Status: DC
  Administered 2013-09-28 – 2013-10-01 (×9): 5000 [IU] via SUBCUTANEOUS
  Filled 2013-09-28 (×11): qty 1

## 2013-09-28 MED ORDER — INSULIN GLARGINE 100 UNIT/ML ~~LOC~~ SOLN
13.0000 [IU] | Freq: Every day | SUBCUTANEOUS | Status: DC
  Filled 2013-09-28: qty 0.13

## 2013-09-28 MED ORDER — INSULIN GLARGINE 100 UNIT/ML ~~LOC~~ SOLN
12.0000 [IU] | Freq: Every day | SUBCUTANEOUS | Status: DC
  Administered 2013-09-28: 12 [IU] via SUBCUTANEOUS
  Filled 2013-09-28 (×2): qty 0.12

## 2013-09-28 MED ORDER — PREDNISONE 5 MG PO TABS
5.0000 mg | ORAL_TABLET | Freq: Every day | ORAL | Status: DC
  Administered 2013-09-29 – 2013-10-01 (×3): 5 mg via ORAL
  Filled 2013-09-28 (×6): qty 1

## 2013-09-28 MED ORDER — CHLORHEXIDINE GLUCONATE 0.12 % MT SOLN
15.0000 mL | Freq: Two times a day (BID) | OROMUCOSAL | Status: DC
Start: 2013-09-28 — End: 2013-10-01
  Administered 2013-09-29 – 2013-09-30 (×3): 15 mL via OROMUCOSAL
  Filled 2013-09-28 (×9): qty 15

## 2013-09-28 MED ORDER — PREDNISONE 1 MG PO TABS: 3.0000 mg | ORAL_TABLET | Freq: Every day | ORAL | Status: DC

## 2013-09-28 MED ORDER — INSULIN GLARGINE 100 UNIT/ML ~~LOC~~ SOLN: 20.0000 [IU] | Freq: Every day | SUBCUTANEOUS | Status: DC

## 2013-09-28 MED ORDER — BIOTENE DRY MOUTH MT LIQD
15.0000 mL | Freq: Two times a day (BID) | OROMUCOSAL | Status: DC
  Administered 2013-09-28 – 2013-09-30 (×6): 15 mL via OROMUCOSAL

## 2013-09-28 NOTE — Progress Notes (Addendum)
TEAM 1 - Stepdown/ICU TEAM Progress Note  Erika DerryJudy A [name] SWF:[id number] DOB: December 31, 1950 DOA: 09/18/2013 PCP: Fredirick MaudlinHAWKINS,EDWARD L, MD  Admit HPI / Brief Narrative: 22GU RK: 62yo WF PMHx  CAD, mild systolic CHF, chronic pain. Admitted after mechanical fall causing right-sided femoral fracture. Evaluated by cardiology and orthopedics then underwent surgical correction by orthopedics on 09/19/2013. Blood pressure was slightly soft due to overuse of narcotics and due to relative adrenal insufficiency (chronic sterids due to PMR). Ant-HTN meds were adjusted and she was switched to stress dose steroids, minimize narcotics.  She developed CP and SOB overnight into 7/15. She did have mild bump in troponin 7/15 consistent with NSTEMI. EKG with inferolateral TW changes without elevation. Cardiology initiated a heparin drip and transferred to pt step down.   HPI/Subjective: 7/18 A./O. x4, NAD, when I entered room patient was sleeping soundly and required shaking fairly hard in order to wake her. Request suppository in order to have a BM. Patient also stated that some of her delirium and nightmares most likely related to the higher dose of prednisone she had received here in the hospital. States any dose greater than 10 mg causes these side effects.  Assessment/Plan: Drug-seeking behavior -The day physicians will be the only personnel to adjust pain/anxiety medication.  Anxiety/Acute delirium  -multifactorial: prolonged hospitalization, recent stress dose steroids, recent change in narcotic dose, and suspected under dosing BZD's  -7/16: Patient had significant delirium and had been resumed on home dose Xanax (1mg ) so MD decreased Xanax to 0.5 mg q 6hr PRN anxiety  -7/16: Caregiver insisted patient receive full dose benzodiazepine and narcotics. MD Counseled caregiver this could actually be exacerbating her delirium/delusions in light of her acute health problems.  -7/16: dc'd Dilaudid-cont'd Vicodin and home  Fentanyl patch  -in d/w pt partner [name] she mentioned that "the confusion was much worse this time" suggestive of prior issues with acute delirium during prior hospitalizations  -7/18 after having to shake patient vigorously to wake her from her sleep the first request was for additional pain medication. Discussed at length control of chronic pain. Goal is to insure pain is at manageable level preferably 3-4/10, and it was unrealistic to believe pain was going to decrease to 0/10. Patient appeared to understand why overmedication could cause serious side effects such as exacerbation of delirium, decreased pulmonary/cardiac function.  NSTEMI /CAD- multiple PCIs/Elevated troponin  -Cards managing  -Troponin peaked 6.94 with slow trend down  -planned cath dc'd with no plans to pursue in future  -cath Oct 2014 with diffuse CAD but no amenable PCI targets so focus will be on medical rxn  -cont BB, ASA, Plavix  -7/17: Cards dc'd IV Heparin and began Imdur and dc'd IV NTG  -Negative CP/SOB  Rhythm strip overnight showed new QT prolongation -Amitriptyline can cause/exacerbate QT prolongation will slowly titrate down. Decrease to 75 mg each bedtime  Acute respiratory failure with hypoxia/Acute diastolic heart failure exacerbation  -diuresing well on PO Lasix  -Repeat echo this admission now shows normal LVEF of 55-60% with grade 1 diastolic dysfunction which is improved from systolic dysfunction seen on cardiac catheterization October 2014 at that time patient had an EF of 35-40%  -incontinent of urine due to AMS so insert foley for accurate I/O and to protect skin  -7/18 resolved    Leukocytosis  -no fever or definitive source of an infection (see below re: UA)  -suspect elevated WBC from NSTEMI and not infection but will follow closely  -Trending down, afebrile continue  to follow  Diabetes mellitus type 1, uncontrolled, insulin dependent  -7/18 hemoglobin A1c=10.4  -at home was on high am and  low pm dose Lantus-titrate up as indicated  -cont senstive SSI-pt very sensitive to insulin and given delirium likely cannot make staff aware if glucose dropping (at baseline she had that awareness) -7/18 Increase Lantus to 25 units QAm, and Lantus 12 units QHS   Polymyalgia rheumatica on chronic steroids  -back on Prednisone-partner clarified home dose was 2.5 mg -7/18 decreased prednisone to 5 mg daily by: Will keep her at this dose for 2-3 days and then reduce it further (home dose 2.5 mg daily)  HTN (hypertension)  -controlled   Hip fracture- surgery 09/19/13  -PT recommends SNF for rehab  -OT eval pending  -per SW notes had previously been accepted to [facility] and if medically stable amy be ready by Monday      DVT prophylaxis: Convert to SQ heparin-pt with peripheral neuropathy and simple palpation of LEs caused pain so would not tolerate SCDs  Code Status: Full  Family Communication: None  Disposition Plan/Expected LOS: delirium cleared discharge in the next 24-48 hours     Consultants: Dr. Carolanne Grumbling (cardiology)   Procedure/Significant Events: 7/16 echocardiogram; - LVEF= 55%-to 60%. -(grade 1 diastolic dysfunction).    Culture 7/16 urine negative   Antibiotics: NA  DVT prophylaxis: Subcutaneous heparin   Devices NA  LINES / TUBES:  7/15 22ga right forearm    Continuous Infusions:   Objective: VITAL SIGNS: Temp: 98.3 F (36.8 C) (07/18 1620) Temp src: Oral (07/18 1620) BP: 146/51 mmHg (07/18 1620) Pulse Rate: 80 (07/18 1726) SPO2; FIO2:   Intake/Output Summary (Last 24 hours) at 09/28/13 1800 Last data filed at 09/28/13 1626  Gross per 24 hour  Intake     63 ml  Output   3050 ml  Net  -2987 ml     Exam: General: A./O. x4, NAD, (exhibits drug-seeking behavior i.e. as soon as she wakes up requests more narcotics) No acute respiratory distress Lungs: Clear to auscultation bilaterally without wheezes or crackles Cardiovascular:  Regular rate and rhythm without murmur gallop or rub normal S1 and S2 Abdomen: Nontender, nondistended, soft, bowel sounds positive, no rebound, no ascites, no appreciable mass Extremities: No significant cyanosis, clubbing, or edema bilateral lower extremities. Right upper thigh bruising appropriate for patient with hip surgery; clearing  Data Reviewed: Basic Metabolic Panel:  Recent Labs Lab 09/26/13 0139 09/27/13 0411 09/28/13 0240  NA 141 141 131*  K 4.1 3.4* 4.0  CL 95* 97 90*  CO2 26 30 24   GLUCOSE 167* 88 230*  BUN 16 26* 28*  CREATININE 0.87 1.06 0.90  CALCIUM 10.2 9.3 8.8   Liver Function Tests:  Recent Labs Lab 09/27/13 0411  AST 19  ALT 13  ALKPHOS 80  BILITOT 0.3  PROT 7.3  ALBUMIN 3.4*   No results found for this basename: LIPASE, AMYLASE,  in the last 168 hours No results found for this basename: AMMONIA,  in the last 168 hours CBC:  Recent Labs Lab 09/24/13 0340 09/25/13 0257 09/26/13 0139 09/27/13 0411 09/28/13 0240  WBC 9.6 11.6* 17.2* 19.4* 14.4*  HGB 9.5* 9.8* 12.0 11.2* 10.3*  HCT 28.0* 29.4* 34.8* 33.2* 30.2*  MCV 90.3 89.1 89.2 89.2 89.6  PLT 427* 443* 551* 539* 492*   Cardiac Enzymes:  Recent Labs Lab 09/25/13 0257 09/25/13 0855 09/25/13 1424 09/25/13 2020 09/26/13 0139  TROPONINI <0.30 3.21* 4.52* 6.94* 5.80*   BNP (  last 3 results)  Recent Labs  09/18/13 2221  PROBNP 700.3*   CBG:  Recent Labs Lab 09/28/13 0337 09/28/13 0817 09/28/13 1102 09/28/13 1212 09/28/13 1622  GLUCAP 198* 233* 226* 238* 391*    Recent Results (from the past 240 hour(s))  SURGICAL PCR SCREEN     Status: None   Collection Time    09/19/13  9:21 AM      Result Value Ref Range Status   MRSA, PCR NEGATIVE  NEGATIVE Final   Staphylococcus aureus NEGATIVE  NEGATIVE Final   Comment:            The Xpert SA Assay (FDA     approved for NASAL specimens     in patients over 31 years of age),     is one component of     a comprehensive  surveillance     program.  Test performance has     been validated by [employer] for patients greater     than or equal to 23 year old.     It is not intended     to diagnose infection nor to     guide or monitor treatment.  URINE CULTURE     Status: None   Collection Time    09/26/13 10:10 AM      Result Value Ref Range Status   Specimen Description URINE, RANDOM   Final   Special Requests NONE   Final   Culture  Setup Time     Final   Value: 09/26/2013 18:26     Performed at [facility] Count     Final   Value: NO GROWTH     Performed at [facility]   Culture     Final   Value: NO GROWTH     Performed at [facility]   Report Status 09/27/2013 FINAL   Final     Studies:  Recent x-ray studies have been reviewed in detail by the Attending Physician  Scheduled Meds:  Scheduled Meds: . amitriptyline  75 mg Oral QHS  . amLODipine  2.5 mg Oral Daily  . antiseptic oral rinse  15 mL Mouth Rinse q12n4p  . aspirin EC  162 mg Oral BID  . calcium-vitamin D  1 tablet Oral Daily  . carvedilol  12.5 mg Oral BID WC  . chlorhexidine  15 mL Mouth Rinse BID  . clopidogrel  75 mg Oral Daily  . feeding supplement (ENSURE)  1 Container Oral BID BM  . fentaNYL  25 mcg Transdermal Q72H  . folic acid  0.5 mg Oral Daily  . furosemide  40 mg Oral Daily  . haloperidol lactate  2 mg Intravenous Once  . heparin subcutaneous  5,000 Units Subcutaneous 3 times per day  . insulin aspart  0-9 Units Subcutaneous 6 times per day  . [START ON 09/29/2013] insulin glargine  25 Units Subcutaneous Daily   And  . insulin glargine  12 Units Subcutaneous QHS  . iron polysaccharides  150 mg Oral Daily  . isosorbide mononitrate  60 mg Oral BID  . latanoprost  1 drop Both Eyes QHS  . LORazepam  0.5 mg Intravenous Once  . pantoprazole  40 mg Oral QAC breakfast  . potassium chloride SA  30 mEq Oral Daily  . predniSONE  5 mg Oral Q breakfast  . sertraline  50 mg Oral  Daily  . sodium chloride  3 mL Intravenous  Q12H  . timolol  1 drop Both Eyes Daily    Time spent on care of this patient: 40 mins   Drema Dallas , MD   [facility] Office  [phone] Pager [phone]  On-Call/Text Page:      [email or web address]      password [email or web address]  If 7PM-7AM, please contact night-coverage [email or web address] Password [email or web address] 09/28/2013, 6:00 PM   LOS: 10 days

## 2013-09-28 NOTE — Progress Notes (Addendum)
MD contacted to inform that QTC >50 during am measurement with possible new bundle block branch; collected by CMT, nurse was never notified.  Once nurse reviewed measurement during 1700 hour , Nurse contacted CMT for verification.  Nurse verified am measurement was accurate.  Nurse performed pm measurement with second nurse verification with QTC <50, measurement documented in Epic.  MD made aware; medication modification was ordered.  Nurse will continue to monitor pt.

## 2013-09-28 NOTE — Progress Notes (Addendum)
SUBJECTIVE:  Complained of CP overnight  OBJECTIVE:   Vitals:   Filed Vitals:   09/28/13 0638 09/28/13 0806 09/28/13 0850 09/28/13 1039  BP:  161/64 146/53 137/60  Pulse:  83 75   Temp:  98.3 F (36.8 C)    TempSrc:  Axillary    Resp:  17 15   Height:      Weight: 153 lb (69.4 kg)     SpO2:  96%     I&O's:   Intake/Output Summary (Last 24 hours) at 09/28/13 1127 Last data filed at 09/28/13 0804  Gross per 24 hour  Intake    120 ml  Output   2075 ml  Net  -1955 ml   TELEMETRY: Reviewed telemetry pt in NSR:     PHYSICAL EXAM General: Well developed, well nourished, in no acute distress Head: Eyes PERRLA, No xanthomas.   Normal cephalic and atramatic  Lungs:   Clear bilaterally to auscultation and percussion. Heart:   HRRR S1 S2 Pulses are 2+ & equal. Abdomen: Bowel sounds are positive, abdomen soft and non-tender without masses  Extremities:   No clubbing, cyanosis or edema.  DP +1 Neuro: Alert and oriented X 3. Psych:  Good affect, responds appropriately   LABS: Basic Metabolic Panel:  Recent Labs  40/98/11 0411 09/28/13 0240  NA 141 131*  K 3.4* 4.0  CL 97 90*  CO2 30 24  GLUCOSE 88 230*  BUN 26* 28*  CREATININE 1.06 0.90  CALCIUM 9.3 8.8   Liver Function Tests:  Recent Labs  09/27/13 0411  AST 19  ALT 13  ALKPHOS 80  BILITOT 0.3  PROT 7.3  ALBUMIN 3.4*   No results found for this basename: LIPASE, AMYLASE,  in the last 72 hours CBC:  Recent Labs  09/27/13 0411 09/28/13 0240  WBC 19.4* 14.4*  HGB 11.2* 10.3*  HCT 33.2* 30.2*  MCV 89.2 89.6  PLT 539* 492*   Cardiac Enzymes:  Recent Labs  09/25/13 1424 09/25/13 2020 09/26/13 0139  TROPONINI 4.52* 6.94* 5.80*   BNP: No components found with this basename: POCBNP,  D-Dimer: No results found for this basename: DDIMER,  in the last 72 hours Hemoglobin A1C: No results found for this basename: HGBA1C,  in the last 72 hours Fasting Lipid Panel: No results found for this  basename: CHOL, HDL, LDLCALC, TRIG, CHOLHDL, LDLDIRECT,  in the last 72 hours Thyroid Function Tests: No results found for this basename: TSH, T4TOTAL, FREET3, T3FREE, THYROIDAB,  in the last 72 hours Anemia Panel: No results found for this basename: VITAMINB12, FOLATE, FERRITIN, TIBC, IRON, RETICCTPCT,  in the last 72 hours Coag Panel:   Lab Results  Component Value Date   INR 0.94 09/18/2013   INR 0.99 01/08/2013   INR 1.06 10/10/2012    RADIOLOGY: Dg Hip Complete Right  09/18/2013   CLINICAL DATA:  Right hip pain secondary to a fall.  EXAM: RIGHT HIP - COMPLETE 2+ VIEW  COMPARISON:  None.  FINDINGS: There is a slightly impacted subcapital fracture or right femoral neck. No displacement.  Pelvic bones are intact.  IMPRESSION: Impacted subcapital fracture of the right femoral neck.   Electronically Signed   By: Geanie Cooley M.D.   On: 09/18/2013 18:59   Dg Hip Operative Right  09/19/2013   CLINICAL DATA:  Operative images from the ORIF of a right subcapital femoral neck fracture.  EXAM: DG OPERATIVE RIGHT HIP  TECHNIQUE: A single spot fluoroscopic AP image of the right hip  is submitted.  COMPARISON:  09/18/2013  FINDINGS: Three screws reduce the subcapital fracture into near anatomic alignment. Mild residual valgus angulation is noted. Orthopedic hardware is well-seated. No evidence of a new fracture or operative complication.  IMPRESSION: ORIF of a right subcapital femoral neck fracture as described.   Electronically Signed   By: Amie Portlandavid  Ormond M.D.   On: 09/19/2013 17:41   Ct Head Wo Contrast  09/25/2013   CLINICAL DATA:  Decreased mentation  EXAM: CT HEAD WITHOUT CONTRAST  TECHNIQUE: Contiguous axial images were obtained from the base of the skull through the vertex without intravenous contrast.  COMPARISON:  10/09/2012  FINDINGS: Mild to moderate diffuse atrophy. No hydrocephalus. No evidence of hemorrhage, infarct, mass, or extra-axial fluid. Calvarium is intact.  IMPRESSION: No acute findings    Electronically Signed   By: Esperanza Heiraymond  Rubner M.D.   On: 09/25/2013 22:12   Dg Pelvis Portable  09/19/2013   CLINICAL DATA:  Postop right hip.  EXAM: PORTABLE PELVIS 1-2 VIEWS  COMPARISON:  None.  FINDINGS: Three screws reduce a femoral neck fracture on the right. Fracture is in near anatomic alignment with mild valgus angulation. Orthopedic hardware is well-seated. No evidence of an operative complication.  IMPRESSION: Status post ORIF of a right femoral neck fracture.   Electronically Signed   By: Amie Portlandavid  Ormond M.D.   On: 09/19/2013 18:21   Dg Chest Port 1 View  09/25/2013   CLINICAL DATA:  63 year old female with chest pain cough altered mental status. Recent hip surgery. Initial encounter.  EXAM: PORTABLE CHEST - 1 VIEW  COMPARISON:  09/18/2013 and earlier.  FINDINGS: Portable AP semi upright view at 1059 hrs. More lordotic view. Stable cardiac size and mediastinal contours. Mildly lower lung volumes. Increased pulmonary interstitial opacity with slight basilar predominance. No pneumothorax, pleural effusion or consolidation.  IMPRESSION: Increased pulmonary interstitial opacity. Differential considerations: Developing interstitial edema versus viral/atypical respiratory infection.   Electronically Signed   By: Augusto GambleLee  Hall M.D.   On: 09/25/2013 11:10   Dg Chest Port 1 View  09/18/2013   CLINICAL DATA:  Preop hip fracture.  Diabetes.  Hypertension.  EXAM: PORTABLE CHEST - 1 VIEW  COMPARISON:  01/07/2013  FINDINGS: Postoperative changes in the mediastinum. Normal heart size and pulmonary vascularity. Interstitial changes in the lungs probably representing fibrosis, similar to previous study no focal airspace disease. No blunting of costophrenic angles. No pneumothorax.  IMPRESSION: No active disease.   Electronically Signed   By: Burman NievesWilliam  Stevens M.D.   On: 09/18/2013 21:04   ASSESSMENT AND PLAN  NSTEMI (non-ST elevated myocardial infarction) - complained of CP overnight but she is confused and unclear  whether she had pain or not.  Nothing documented by nurse. Active Problems:  Diabetes mellitus type 1, uncontrolled, insulin dependent  Polymyalgia rheumatica  HTN (hypertension)  CAD- multiple PCIs  Hearing loss sensory,  bilateral Hip fracture- surgery 09/19/13  Elevated troponin- 0.41 post op hip  Anxiety  Cardiomyopathy, ischemic- EF 35-40% at cath Oct 2014  Plan: Continue dual antiplatelet therapy with aspirin, Plavix and Isosorbide mononitrate. Watch over the weekend and hopefully transfer to rehabilitation facility Monday.    Quintella ReichertURNER,Arminda Foglio R, MD  09/28/2013  11:27 AM

## 2013-09-28 NOTE — Progress Notes (Addendum)
Pt c/o of chest pain per partner.  Pt asked and appeared confused.  Nurse obtained STAT EKG, documented in EPIC.  Primary MD contacted to inform of event, instructed nurse to contact cardiology. Cardiology called nurse back and acknowledged eventand interventions, no additional orders were ordered at this time.  Information passed on to night nurse

## 2013-09-29 DIAGNOSIS — E1059 Type 1 diabetes mellitus with other circulatory complications: Secondary | ICD-10-CM

## 2013-09-29 DIAGNOSIS — R9431 Abnormal electrocardiogram [ECG] [EKG]: Secondary | ICD-10-CM

## 2013-09-29 LAB — MAGNESIUM: Magnesium: 1.9 mg/dL (ref 1.5–2.5)

## 2013-09-29 LAB — COMPREHENSIVE METABOLIC PANEL
ALBUMIN: 3.4 g/dL — AB (ref 3.5–5.2)
ALT: 13 U/L (ref 0–35)
AST: 19 U/L (ref 0–37)
Alkaline Phosphatase: 82 U/L (ref 39–117)
Anion gap: 17 — ABNORMAL HIGH (ref 5–15)
BUN: 19 mg/dL (ref 6–23)
CALCIUM: 9.3 mg/dL (ref 8.4–10.5)
CHLORIDE: 90 meq/L — AB (ref 96–112)
CO2: 24 meq/L (ref 19–32)
CREATININE: 0.78 mg/dL (ref 0.50–1.10)
GFR calc Af Amer: >90 mL/min (ref >90)
GFR, EST NON AFRICAN AMERICAN: 88 mL/min — AB (ref >90)
Glucose, Bld: 196 mg/dL — ABNORMAL HIGH (ref 70–99)
Potassium: 4.2 mEq/L (ref 3.7–5.3)
SODIUM: 131 meq/L — AB (ref 137–147)
Total Bilirubin: 0.4 mg/dL (ref 0.3–1.2)
Total Protein: 7.3 g/dL (ref 6.0–8.3)

## 2013-09-29 LAB — GLUCOSE, CAPILLARY
GLUCOSE-CAPILLARY: 251 mg/dL — AB (ref 70–99)
GLUCOSE-CAPILLARY: 169 mg/dL — AB (ref 70–99)
Glucose-Capillary: 318 mg/dL — ABNORMAL HIGH (ref 70–99)

## 2013-09-29 LAB — CBC WITH DIFFERENTIAL/PLATELET
Basophils Absolute: 0 10*3/uL (ref 0.0–0.1)
Basophils Relative: 0 % (ref 0–1)
EOS ABS: 0.3 10*3/uL (ref 0.0–0.7)
EOS PCT: 3 % (ref 0–5)
HEMATOCRIT: 33 % — AB (ref 36.0–46.0)
Hemoglobin: 11 g/dL — ABNORMAL LOW (ref 12.0–15.0)
LYMPHS ABS: 5.4 10*3/uL — AB (ref 0.7–4.0)
LYMPHS PCT: 41 % (ref 12–46)
MCH: 29.3 pg (ref 26.0–34.0)
MCHC: 33.3 g/dL (ref 30.0–36.0)
MCV: 88 fL (ref 78.0–100.0)
MONO ABS: 1 10*3/uL (ref 0.1–1.0)
MONOS PCT: 7 % (ref 3–12)
Neutro Abs: 6.4 10*3/uL (ref 1.7–7.7)
Neutrophils Relative %: 49 % (ref 43–77)
Platelets: 516 10*3/uL — ABNORMAL HIGH (ref 150–400)
RBC: 3.75 MIL/uL — AB (ref 3.87–5.11)
RDW: 13.5 % (ref 11.5–15.5)
WBC: 13.2 10*3/uL — AB (ref 4.0–10.5)

## 2013-09-29 LAB — TROPONIN I: TROPONIN I: 0.46 ng/mL — AB (ref <0.30)

## 2013-09-29 MED ORDER — INSULIN GLARGINE 100 UNIT/ML ~~LOC~~ SOLN: 30.0000 [IU] | Freq: Every day | SUBCUTANEOUS | Status: DC

## 2013-09-29 MED ORDER — INSULIN GLARGINE 100 UNIT/ML ~~LOC~~ SOLN
28.0000 [IU] | Freq: Every day | SUBCUTANEOUS | Status: DC
Start: 2013-09-30 — End: 2013-10-01
  Administered 2013-09-30 – 2013-10-01 (×2): 28 [IU] via SUBCUTANEOUS
  Filled 2013-09-29 (×2): qty 0.28

## 2013-09-29 MED ORDER — INSULIN GLARGINE 100 UNIT/ML ~~LOC~~ SOLN: 15.0000 [IU] | Freq: Every day | SUBCUTANEOUS | Status: DC

## 2013-09-29 MED ORDER — INSULIN GLARGINE 100 UNIT/ML ~~LOC~~ SOLN
15.0000 [IU] | Freq: Every day | SUBCUTANEOUS | Status: DC
  Administered 2013-09-29: 15 [IU] via SUBCUTANEOUS
  Filled 2013-09-29 (×3): qty 0.15

## 2013-09-29 MED ORDER — INSULIN ASPART 100 UNIT/ML ~~LOC~~ SOLN
0.0000 [IU] | SUBCUTANEOUS | Status: DC
  Administered 2013-09-29: 5 [IU] via SUBCUTANEOUS
  Administered 2013-09-29: 11 [IU] via SUBCUTANEOUS
  Administered 2013-09-30: 3 [IU] via SUBCUTANEOUS
  Administered 2013-09-30: 8 [IU] via SUBCUTANEOUS
  Administered 2013-09-30: 3 [IU] via SUBCUTANEOUS
  Administered 2013-10-01: 8 [IU] via SUBCUTANEOUS

## 2013-09-29 NOTE — Progress Notes (Signed)
Progress NOTE  Date: 09/29/2013               Patient Name:  [name] MRN: [id number]  DOB: 03-25-1950 Age / Sex: 63 y.o., female        PCP: Fredirick Maudlin Primary Cardiologist: Hilty            Referring Physician: Burney Gauze          History of Present Illness: Patient is a 63 y.o. female with a PMHx of coronary artery disease-status post CABG, who was admitted to [facility] on 09/18/2013 for evaluation of broken right hip.  The patient has a history of coronary artery disease dating back to 32. She had coronary artery bypass grafting at that time. She had a myocardial infarction in November, 2014 requiring placement of a drug-eluting stent. She's been on Plavix and aspirin since that time. She also has a history of insulin-dependent diabetes mellitus, COPD, CHF. She is deaf.  She had a NSTEMI following her hip surgery .    She has had some delirium - possibly due to pain meds.   Her primary cardiologist [facility]) has reviewed the case  Agrees that we should continue with conservative care if possible.  In talking to her today, I;m not clear if she is having chest discomfort.  When asked about chest pain, she says yes .  But when asked where exactly her chest hurts, she say, my neck, legs, back.      Medications: Outpatient medications: Prescriptions prior to admission  Medication Sig Dispense Refill  . amitriptyline (ELAVIL) 100 MG tablet Take 100 mg by mouth at bedtime.      Marland Kitchen amLODipine (NORVASC) 2.5 MG tablet Take 2.5 mg by mouth daily.      Marland Kitchen aspirin EC 81 MG tablet Take 162 mg by mouth 2 (two) times daily.      . Calcium Carb-Cholecalciferol 600-500 MG-UNIT CAPS Take 1 capsule by mouth daily.      . carvedilol (COREG) 25 MG tablet Take 25 mg by mouth 2 (two) times daily with a meal.      . clopidogrel (PLAVIX) 75 MG tablet Take 1 tablet (75 mg total) by mouth daily.  30 tablet  11  . COD LIVER OIL PO Take 1 capsule by mouth daily.       . Cyanocobalamin (VITAMIN B-12  PO) Take 1 tablet by mouth daily.      . diclofenac sodium (VOLTAREN) 1 % GEL Apply 4 g topically daily as needed (muscle pain).       . folic acid (FOLVITE) 800 MCG tablet Take 1,600 mcg by mouth daily.      . furosemide (LASIX) 40 MG tablet Take 40-60 mg by mouth See admin instructions. Take 40 mg every  morning.  Take 20 mg every other evening.      . insulin glargine (LANTUS) 100 UNIT/ML injection Inject 25 Units into the skin 2 (two) times daily. Patient takes 25 units in the morning (7-8am) and 8 units every evening(6pm)      . insulin lispro (HUMALOG) 100 UNIT/ML injection Inject 5-12 Units into the skin 3 (three) times daily before meals. 5-12 units per sliding scale      . iron polysaccharides (NU-IRON) 150 MG capsule Take 1 capsule (150 mg total) by mouth daily.      . isosorbide mononitrate (IMDUR) 60 MG 24 hr tablet Take 1 tablet (60 mg total) by mouth 2 (two) times  daily.  60 tablet  10  . Lutein 20 MG CAPS Take 1 capsule by mouth daily.      . nitroGLYCERIN (NITROSTAT) 0.4 MG SL tablet Place 0.4 mg under the tongue every 5 (five) minutes as needed for chest pain.      Marland Kitchen omeprazole (PRILOSEC) 20 MG capsule Take 20 mg by mouth daily.      Marland Kitchen oxyCODONE-acetaminophen (PERCOCET) 10-325 MG per tablet Take 1 tablet by mouth every 4 (four) hours as needed for pain.      . potassium chloride SA (K-DUR,KLOR-CON) 20 MEQ tablet Take 30 mEq by mouth daily.      . predniSONE (DELTASONE) 10 MG tablet Take 10 mg by mouth daily.      . Pyridoxine HCl (VITAMIN B-6 PO) Take 1 tablet by mouth daily.      . sertraline (ZOLOFT) 50 MG tablet Take 50 mg by mouth daily.      . timolol (TIMOPTIC) 0.25 % ophthalmic solution Place 1 drop into both eyes daily.       . TRAVATAN Z 0.004 % SOLN ophthalmic solution Place 1 drop into both eyes at bedtime.      . triamcinolone cream (KENALOG) 0.1 % Apply 1 application topically daily. Apply to rash      . valsartan (DIOVAN) 40 MG tablet Take 40 mg by mouth daily.        . vitamin E 400 UNIT capsule Take 400 Units by mouth daily.      Marland Kitchen zinc gluconate 50 MG tablet Take 50 mg by mouth daily.      Marland Kitchen ZINC GLUCONATE PO Take by mouth.      . [DISCONTINUED] ALPRAZolam (XANAX) 1 MG tablet Take 1 mg by mouth 4 (four) times daily as needed for sleep or anxiety.      . [DISCONTINUED] fentaNYL (DURAGESIC - DOSED MCG/HR) 25 MCG/HR Place 1 patch onto the skin every 3 (three) days.        Current medications: Current Facility-Administered Medications  Medication Dose Route Frequency Provider Last Rate Last Dose  . acetaminophen (TYLENOL) tablet 650 mg  650 mg Oral Q6H PRN Sheral Apley, MD      . ALPRAZolam Prudy Feeler) tablet 0.5 mg  0.5 mg Oral QID PRN Drema Dallas, MD   0.5 mg at 09/29/13 0028  . amitriptyline (ELAVIL) tablet 75 mg  75 mg Oral QHS Drema Dallas, MD   75 mg at 09/28/13 2233  . amLODipine (NORVASC) tablet 2.5 mg  2.5 mg Oral Daily Doree Albee, MD   2.5 mg at 09/29/13 0942  . antiseptic oral rinse (BIOTENE) solution 15 mL  15 mL Mouth Rinse q12n4p Drema Dallas, MD   15 mL at 09/28/13 1600  . aspirin EC tablet 162 mg  162 mg Oral BID Doree Albee, MD   162 mg at 09/29/13 9604  . bisacodyl (DULCOLAX) suppository 10 mg  10 mg Rectal BID PRN Drema Dallas, MD   10 mg at 09/28/13 1636  . calcium-vitamin D (OSCAL WITH D) 500-200 MG-UNIT per tablet 1 tablet  1 tablet Oral Daily Doree Albee, MD   1 tablet at 09/29/13 0941  . carvedilol (COREG) tablet 12.5 mg  12.5 mg Oral BID WC Leroy Sea, MD   12.5 mg at 09/29/13 0835  . chlorhexidine (PERIDEX) 0.12 % solution 15 mL  15 mL Mouth Rinse BID Drema Dallas, MD   15 mL at 09/29/13 0837  . clopidogrel (PLAVIX) tablet  75 mg  75 mg Oral Daily Doree Albee, MD   75 mg at 09/29/13 0941  . feeding supplement (ENSURE) (ENSURE) pudding 1 Container  1 Container Oral BID BM Penny Pia, MD   1 Container at 09/28/13 1418  . fentaNYL (DURAGESIC - dosed mcg/hr) patch 25 mcg  25 mcg Transdermal Q72H Doree Albee, MD   25 mcg at 09/27/13 1042  . folic acid (FOLVITE) tablet 0.5 mg  0.5 mg Oral Daily Doree Albee, MD   0.5 mg at 09/29/13 0941  . furosemide (LASIX) tablet 40 mg  40 mg Oral Daily Leroy Sea, MD   40 mg at 09/29/13 1610  . haloperidol lactate (HALDOL) injection 2 mg  2 mg Intravenous Once Russella Dar, NP      . heparin injection 5,000 Units  5,000 Units Subcutaneous 3 times per day Margie Billet, RPH   5,000 Units at 09/29/13 0640  . HYDROcodone-acetaminophen (NORCO/VICODIN) 5-325 MG per tablet 1 tablet  1 tablet Oral Q4H PRN Leroy Sea, MD   1 tablet at 09/29/13 0836  . insulin aspart (novoLOG) injection 0-9 Units  0-9 Units Subcutaneous 6 times per day Drema Dallas, MD   5 Units at 09/29/13 [phone]  . insulin glargine (LANTUS) injection 25 Units  25 Units Subcutaneous Daily Drema Dallas, MD   25 Units at 09/29/13 0944   And  . insulin glargine (LANTUS) injection 12 Units  12 Units Subcutaneous QHS Drema Dallas, MD   12 Units at 09/28/13 2232  . iron polysaccharides (NIFEREX) capsule 150 mg  150 mg Oral Daily Doree Albee, MD   150 mg at 09/29/13 0942  . isosorbide mononitrate (IMDUR) 24 hr tablet 60 mg  60 mg Oral BID Cassell Clement, MD   60 mg at 09/29/13 0837  . latanoprost (XALATAN) 0.005 % ophthalmic solution 1 drop  1 drop Both Eyes QHS Doree Albee, MD   1 drop at 09/29/13 0029  . LORazepam (ATIVAN) injection 0.5 mg  0.5 mg Intravenous Once Leda Gauze, NP      . metoprolol (LOPRESSOR) injection 5 mg  5 mg Intravenous Q4H PRN Leroy Sea, MD      . nitroGLYCERIN (NITROSTAT) SL tablet 0.4 mg  0.4 mg Sublingual Q5 min PRN Doree Albee, MD   0.4 mg at 09/25/13 1157  . ondansetron (ZOFRAN) injection 4 mg  4 mg Intravenous Q6H PRN Penny Pia, MD   4 mg at 09/23/13 1059  . pantoprazole (PROTONIX) EC tablet 40 mg  40 mg Oral QAC breakfast Doree Albee, MD   40 mg at 09/29/13 0836  . polyvinyl alcohol (LIQUIFILM TEARS) 1.4 % ophthalmic  solution 2 drop  2 drop Both Eyes PRN Leroy Sea, MD   2 drop at 09/28/13 2234  . potassium chloride (K-DUR,KLOR-CON) CR tablet 30 mEq  30 mEq Oral Daily Doree Albee, MD   30 mEq at 09/29/13 0942  . predniSONE (DELTASONE) tablet 5 mg  5 mg Oral Q breakfast Drema Dallas, MD   5 mg at 09/29/13 [phone]  . sertraline (ZOLOFT) tablet 50 mg  50 mg Oral Daily Doree Albee, MD   50 mg at 09/29/13 0942  . sodium chloride 0.9 % bolus 250 mL  250 mL Intravenous PRN Leroy Sea, MD   250 mL at 09/22/13 0017  . sodium chloride 0.9 % injection 3 mL  3 mL Intravenous Q12H Doree Albee, MD   3 mL at  09/29/13 0950  . timolol (TIMOPTIC) 0.25 % ophthalmic solution 1 drop  1 drop Both Eyes Daily Doree Albee, MD   1 drop at 09/29/13 0940     Allergies  Allergen Reactions  . Codeine     Unknown  . Flagyl [Metronidazole]     Unknown  . Fluocinonide     Unknown  . Ranexa [Ranolazine] Other (See Comments)    Elevated liver function  . Statins Other (See Comments)    Muscle aches  . Biaxin [Clarithromycin] Rash  . Demerol [Meperidine] Rash     Past Medical History  Diagnosis Date  . Respiratory arrest   . COPD (chronic obstructive pulmonary disease)   . CHF (congestive heart failure)   . Diabetes mellitus     insulin-dep  . Fibromyalgia   . Deaf     bilateral neurosensory hearing loss - Dr. Narda Bonds  . Myocardial infarction   . Stroke   . GERD (gastroesophageal reflux disease)   . Depression   . Hypertension   . Pneumonia   . Coronary artery disease     a. h/o CABG  . Polymyalgia rheumatica   . Unstable angina   . PAD (peripheral artery disease)     LEAs 05/10/2012 - L SFA 50-69% diameter reduction, bilat ICA with 0-49% diameter reduction  . Tobacco abuse   . History of nuclear stress test 11/2011    lexiscan; low risk, non-diagnostic for ischemia    Past Surgical History  Procedure Laterality Date  . Coronary artery bypass graft  04/03/1993    x5 LIMA to LAD; SVG to  DX1; SVG to RCA, SVG to OM;  (Dr. Tyrone Sage)  . Eye surgery    . Coronary angioplasty  1995    RCA PTCA (07/23/1993); mid-LAD PTCA (08/21/1993); PTCA for restenosis RCA (09/13/1993),   . Coronary angioplasty  1998    Cfx & LAD PTCA  . Coronary angioplasty  1999    Cfx & LAD PTCA  . Coronary angioplasty  09/17/2004    cutting balloon small vessel mid LAD beyond LIMA insertion (2.5x3mm Cypher to mid SVG to RCA  . Coronary angioplasty  05/21/2010    patent LIMA-LAD, diffuse disease distally, patent SVG-small diagonal, patent SVG-right w/widely patent stent, patent SVG-OM1 (Dr. Kirtland Bouchard. [place] Hilty)  . Transthoracic echocardiogram  12/2010    EF=>55%, mild conc LVH; IVC non-dilated w/>50% collapse; mild mitral annular calcif, calcified mitral apparatus, mild MD; mild TR & elevated RVSP; mild-mod AV regurg  . Coronary angioplasty  07/18/2008    patent SVG to acute marginal & PDA, SVG to OM1, SVG to diagonal 1, & LIMA to LAD (Dr. Evlyn Courier(  . Coronary angioplasty  04/20/2007    patent SVG-diagonal, SVG-obtuse marginal, SVG-RCA, LIMA-LAD, patent stent to SVG in RCA (Dr. Claudia Desanctis)  . Cardiac catheterization  04/10/2007    3x29mm Promus DES to SVG to RCA  . Coronary angioplasty  03/22/2006    patent LIMA to LAD, patent SVG to small diagonal, patent SVG to OM, patent vein graft to RV acute marginal & RCA (Dr. Laurell Josephs)  . Coronary angioplasty  12/03/2002    ballon angioplasty to lesion in prox LAD, prox Cfx & stenting, angioplasty to L AV groove artery (Dr. Jonette Eva)  . Coronary angioplasty  09/08/2000    Dr. Jonette Eva  . Hip pinning,cannulated  09/19/2013  . Hip pinning,cannulated Right 09/19/2013    Procedure: CANNULATED HIP PINNING;  Surgeon: Sheral Apley, MD;  Location: [facility]  OR;  Service: Orthopedics;  Laterality: Right;   Fracture table, c-arm, [employer]- Mr. Murphy to call rep    Family History  Problem Relation Age of Onset  . Hypertension Father     Social History:  reports that she quit smoking  about 5 years ago. She does not have any smokeless tobacco history on file. She reports that she does not drink alcohol or use illicit drugs.    Physical Exam: BP 162/98  Pulse 84  Temp(Src) 98.4 F (36.9 C) (Oral)  Resp 18  Ht 5\' 4"  (1.626 m)  Wt 154 lb 9.6 oz (70.126 kg)  BMI 26.52 kg/m2  SpO2 95%  Wt Readings from Last 3 Encounters:  09/29/13 154 lb 9.6 oz (70.126 kg)  09/29/13 154 lb 9.6 oz (70.126 kg)  09/29/13 154 lb 9.6 oz (70.126 kg)    General: Vital signs reviewed and noted. Well-developed, well-nourished, in no acute distress; alert,   Head: Normocephalic, atraumatic, sclera anicteric,   Neck: Supple. Negative for carotid bruits. No JVD   Lungs:  She has a few wheezes - especially on the right side   Heart: RRR with S1 S2. No murmurs, rubs, or gallops   Abdomen:  Soft, non-tender, non-distended with normoactive bowel sounds. No hepatomegaly. No rebound/guarding. No obvious abdominal masses   MSK: Strength and the appear normal for age.   Extremities: No clubbing or cyanosis. No   edema.  Distal pedal pulses are 2+ and equal .  She has some skin sores on her legs.   Neurologic: Speech is a bit slow.   Psych:     Lab results: Basic Metabolic Panel:  Recent Labs Lab 09/27/13 0411 09/28/13 0240 09/29/13 0458  NA 141 131* 131*  K 3.4* 4.0 4.2  CL 97 90* 90*  CO2 30 24 24   GLUCOSE 88 230* 196*  BUN 26* 28* 19  CREATININE 1.06 0.90 0.78  CALCIUM 9.3 8.8 9.3  MG  --   --  1.9    Liver Function Tests:  Recent Labs Lab 09/27/13 0411 09/29/13 0458  AST 19 19  ALT 13 13  ALKPHOS 80 82  BILITOT 0.3 0.4  PROT 7.3 7.3  ALBUMIN 3.4* 3.4*   No results found for this basename: LIPASE, AMYLASE,  in the last 168 hours No results found for this basename: AMMONIA,  in the last 168 hours  CBC:  Recent Labs Lab 09/25/13 0257 09/26/13 0139 09/27/13 0411 09/28/13 0240 09/29/13 0458  WBC 11.6* 17.2* 19.4* 14.4* 13.2*  NEUTROABS  --   --   --   --  6.4   HGB 9.8* 12.0 11.2* 10.3* 11.0*  HCT 29.4* 34.8* 33.2* 30.2* 33.0*  MCV 89.1 89.2 89.2 89.6 88.0  PLT 443* 551* 539* 492* 516*    Cardiac Enzymes:  Recent Labs Lab 09/25/13 0257 09/25/13 0855 09/25/13 1424 09/25/13 2020 09/26/13 0139  TROPONINI <0.30 3.21* 4.52* 6.94* 5.80*    BNP: No components found with this basename: POCBNP,   CBG:  Recent Labs Lab 09/28/13 1102 09/28/13 1212 09/28/13 1622 09/28/13 1924 09/28/13 2133  GLUCAP 226* 238* 391* 259* 281*    Coagulation Studies: No results found for this basename: LABPROT, INR,  in the last 72 hours   Other results: EKG :  Lots are artifact.  NSR.  She has nonspecific ST abnormalities in the lateral leads   Imaging: No results found.       Assessment & Plan:  1. coronary artery disease: The patient has  a long history of coronary artery disease. She had a myocardial infarction in November, 2014 treated with a stent to the saphenous vein graft to her diagonal.  She's had some occasional episodes of chest pain it is unclear whether these were anginal or not. She does not get any regular exercise.  I thought that she was at moderate risk for peri-op CV complication and she did indeed have a NSTEMI following her surgery. Dr. Rennis Golden has reviewed the case and feels that conservative therapy would be best.  At this point, Im not convinced that she is having any angina.  She seems to be having pain all over.  If she is able to rehab from this hip surgery and not have any significant CP, I think we should proceed with rehab and advance as tolerated.    If, on the other hand, she has significant angina with movement / ambulation, I think we will need to consider cardiac cath.   I had a long discussion with [name] ( partner) and she is aware of our discussion and agrees with the conservative plan at this point.  She pointed out that Jamisha often has more confusion after cardiac cath - another reason to not rush into doing a  cath at this point.   2. COPD :    3. Chronic systolic CHF:  Her last assessment of LV function was during her cardiac catheterization on 01/02/2013. She has moderate LV dysfunction with an ejection fraction of 35-40%.  4. Diabetes mellitus:  Plans per int. Med.     Vesta Mixer, Montez Hageman., MD, [facility] 09/29/2013, 11:04 AM Office - [phone] Pager [phone]

## 2013-09-29 NOTE — Progress Notes (Signed)
[place] TEAM 1 - Stepdown/ICU TEAM Progress Note  KARY SUGRUE ZOX:[id number] DOB: 1951/01/30 DOA: 09/18/2013 PCP: Fredirick Maudlin, MD  Admit HPI / Brief Narrative: [name] PMHx  CAD, mild systolic CHF, chronic pain. Admitted after mechanical fall causing right-sided femoral fracture. Evaluated by cardiology and orthopedics then underwent surgical correction by orthopedics on 09/19/2013. Blood pressure was slightly soft due to overuse of narcotics and due to relative adrenal insufficiency (chronic sterids due to PMR). Ant-HTN meds were adjusted and she was switched to stress dose steroids, minimize narcotics.  She developed CP and SOB overnight into 7/15. She did have mild bump in troponin 7/15 consistent with NSTEMI. EKG with inferolateral TW changes without elevation. Cardiology initiated a heparin drip and transferred to pt step down. 7/18 A./O. x4, NAD, when I entered room patient was sleeping soundly and required shaking fairly hard in order to wake her. Request suppository in order to have a BM. Patient also stated that some of her delirium and nightmares most likely related to the higher dose of prednisone she had received here in the hospital. States any dose greater than 10 mg causes these side effects.  HPI/Subjective: 7/19 A./O. x4, NAD,     Assessment/Plan: Drug-seeking behavior -The day physicians will be the only personnel to adjust pain/anxiety medication.  Anxiety/Acute delirium  -multifactorial: prolonged hospitalization, recent stress dose steroids, recent change in narcotic dose, and suspected under dosing BZD's  -7/16: Patient had significant delirium and had been resumed on home dose Xanax (1mg ) so MD decreased Xanax to 0.5 mg q 6hr PRN anxiety  -7/16: Caregiver insisted patient receive full dose benzodiazepine and narcotics. MD Counseled caregiver this could actually be exacerbating her delirium/delusions in light of her acute health problems.  -7/16: dc'd  Dilaudid-cont'd Vicodin and home Fentanyl patch  -in d/w pt partner [name] she mentioned that "the confusion was much worse this time" suggestive of prior issues with acute delirium during prior hospitalizations  -7/18 after having to shake patient vigorously to wake her from her sleep the first request was for additional pain medication. Discussed at length control of chronic pain. Goal is to insure pain is at manageable level preferably 3-4/10, and it was unrealistic to believe pain was going to decrease to 0/10. Patient appeared to understand why overmedication could cause serious side effects such as exacerbation of delirium, decreased pulmonary/cardiac function. -7/19 the delirium has significantly resolved with decrease in benzodiazepine and narcotics, baseline   NSTEMI /CAD- multiple PCIs/Elevated troponin  -Cards managing  -Troponin peaked 6.94 with slow trend down  -planned cath dc'd with no plans to pursue in future  -cath Oct 2014 with diffuse CAD but no amenable PCI targets so focus will be on medical rxn  -cont BB, ASA, Plavix  -7/17: Cards dc'd IV Heparin and began Imdur and dc'd IV NTG  -Negative CP/SOB -Troponin almost WNL. Cleared for D/C by cardiology   Rhythm strip overnight showed new QT prolongation -Amitriptyline can cause/exacerbate QT prolongation will slowly titrate down. Decrease to 75 mg each bedtime  Acute respiratory failure with hypoxia/Acute diastolic heart failure exacerbation  -diuresing well on PO Lasix  -Repeat echo this admission now shows normal LVEF of 55-60% with grade 1 diastolic dysfunction which is improved from systolic dysfunction seen on cardiac catheterization October 2014 at that time patient had an EF of 35-40%  -incontinent of urine due to AMS so insert foley for accurate I/O and to protect skin  -7/18 resolved    Leukocytosis  -no  fever or definitive source of an infection (see below re: UA)  -suspect elevated WBC from NSTEMI and not  infection but will follow closely  -Trending down, afebrile continue to follow  Diabetes mellitus type 1, uncontrolled, insulin dependent  -7/18 hemoglobin A1c=10.4  -at home was on high am and low pm dose Lantus-titrate up as indicated  -cont senstive SSI-pt very sensitive to insulin and given delirium likely cannot make staff aware if glucose dropping (at baseline she had that awareness) -7/19 Increase Lantus to 28 units QAm, and Lantus 15 units QHS   Polymyalgia rheumatica on chronic steroids  -back on Prednisone-partner clarified home dose was 2.5 mg -7/18 decreased prednisone to 5 mg daily by: Will keep her at this dose for 2-3 days and then reduce it further (home dose 2.5 mg daily)  HTN (hypertension)  -controlled   Hip fracture- surgery 09/19/13  -PT recommends SNF for rehab  -OT eval pending  -per SW notes had previously been [facility] to [facility] and if medically stable amy be ready by Monday      DVT prophylaxis: Convert to SQ heparin-pt with peripheral neuropathy and simple palpation of LEs caused pain so would not tolerate SCDs  Code Status: Full  Family Communication: None  Disposition Plan/Expected LOS: delirium cleared discharge in the next 24-48 hours     Consultants: Dr. Carolanne Grumblingracy Turner (cardiology)   Procedure/Significant Events: 7/16 echocardiogram; - LVEF= 55%-to 60%. -(grade 1 diastolic dysfunction).    Culture 7/16 urine negative   Antibiotics: NA  DVT prophylaxis: Subcutaneous heparin   Devices NA  LINES / TUBES:  7/15 22ga right forearm    Continuous Infusions:   Objective: VITAL SIGNS: Temp: 98.4 F (36.9 C) (07/19 0800) Temp src: Oral (07/19 0800) BP: 162/98 mmHg (07/19 0940) Pulse Rate: 84 (07/19 0800) SPO2; FIO2:   Intake/Output Summary (Last 24 hours) at 09/29/13 1214 Last data filed at 09/29/13 0950  Gross per 24 hour  Intake     66 ml  Output   3025 ml  Net  -2959 ml     Exam: General: A./O. x4, NAD, No  acute respiratory distress Lungs: Clear to auscultation bilaterally without wheezes or crackles Cardiovascular: Regular rate and rhythm without murmur gallop or rub normal S1 and S2 Abdomen: Nontender, nondistended, soft, bowel sounds positive, no rebound, no ascites, no appreciable mass Extremities: No significant cyanosis, clubbing, or edema bilateral lower extremities. Right upper thigh bruising appropriate for patient with hip surgery; clearing  Data Reviewed: Basic Metabolic Panel:  Recent Labs Lab 09/26/13 0139 09/27/13 0411 09/28/13 0240 09/29/13 0458  NA 141 141 131* 131*  K 4.1 3.4* 4.0 4.2  CL 95* 97 90* 90*  CO2 26 30 24 24   GLUCOSE 167* 88 230* 196*  BUN 16 26* 28* 19  CREATININE 0.87 1.06 0.90 0.78  CALCIUM 10.2 9.3 8.8 9.3  MG  --   --   --  1.9   Liver Function Tests:  Recent Labs Lab 09/27/13 0411 09/29/13 0458  AST 19 19  ALT 13 13  ALKPHOS 80 82  BILITOT 0.3 0.4  PROT 7.3 7.3  ALBUMIN 3.4* 3.4*   No results found for this basename: LIPASE, AMYLASE,  in the last 168 hours No results found for this basename: AMMONIA,  in the last 168 hours CBC:  Recent Labs Lab 09/25/13 0257 09/26/13 0139 09/27/13 0411 09/28/13 0240 09/29/13 0458  WBC 11.6* 17.2* 19.4* 14.4* 13.2*  NEUTROABS  --   --   --   --  6.4  HGB 9.8* 12.0 11.2* 10.3* 11.0*  HCT 29.4* 34.8* 33.2* 30.2* 33.0*  MCV 89.1 89.2 89.2 89.6 88.0  PLT 443* 551* 539* 492* 516*   Cardiac Enzymes:  Recent Labs Lab 09/25/13 0257 09/25/13 0855 09/25/13 1424 09/25/13 2020 09/26/13 0139  TROPONINI <0.30 3.21* 4.52* 6.94* 5.80*   BNP (last 3 results)  Recent Labs  09/18/13 2221  PROBNP 700.3*   CBG:  Recent Labs Lab 09/28/13 1622 09/28/13 1924 09/28/13 2133 09/29/13 0817 09/29/13 1153  GLUCAP 391* 259* 281* 251* 169*    Recent Results (from the past 240 hour(s))  URINE CULTURE     Status: None   Collection Time    09/26/13 10:10 AM      Result Value Ref Range Status    Specimen Description URINE, RANDOM   Final   Special Requests NONE   Final   Culture  Setup Time     Final   Value: 09/26/2013 18:26     Performed at [facility] Count     Final   Value: NO GROWTH     Performed at [facility]   Culture     Final   Value: NO GROWTH     Performed at [facility]   Report Status 09/27/2013 FINAL   Final     Studies:  Recent x-ray studies have been reviewed in detail by the Attending Physician  Scheduled Meds:  Scheduled Meds: . amitriptyline  75 mg Oral QHS  . amLODipine  2.5 mg Oral Daily  . antiseptic oral rinse  15 mL Mouth Rinse q12n4p  . aspirin EC  162 mg Oral BID  . calcium-vitamin D  1 tablet Oral Daily  . carvedilol  12.5 mg Oral BID WC  . chlorhexidine  15 mL Mouth Rinse BID  . clopidogrel  75 mg Oral Daily  . feeding supplement (ENSURE)  1 Container Oral BID BM  . fentaNYL  25 mcg Transdermal Q72H  . folic acid  0.5 mg Oral Daily  . furosemide  40 mg Oral Daily  . haloperidol lactate  2 mg Intravenous Once  . heparin subcutaneous  5,000 Units Subcutaneous 3 times per day  . insulin aspart  0-9 Units Subcutaneous 6 times per day  . insulin glargine  25 Units Subcutaneous Daily   And  . insulin glargine  12 Units Subcutaneous QHS  . iron polysaccharides  150 mg Oral Daily  . isosorbide mononitrate  60 mg Oral BID  . latanoprost  1 drop Both Eyes QHS  . LORazepam  0.5 mg Intravenous Once  . pantoprazole  40 mg Oral QAC breakfast  . potassium chloride SA  30 mEq Oral Daily  . predniSONE  5 mg Oral Q breakfast  . sertraline  50 mg Oral Daily  . sodium chloride  3 mL Intravenous Q12H  . timolol  1 drop Both Eyes Daily    Time spent on care of this patient: 40 mins   Drema Dallas , MD   [facility] Office  [phone] Pager - [phone]  On-Call/Text Page:      [email or web address]      password [email or web address]  If 7PM-7AM, please contact night-coverage [email or web address] Password  [email or web address] 09/29/2013, 12:14 PM   LOS: 11 days

## 2013-09-29 NOTE — Progress Notes (Signed)
Foley Catheter was removed from pt per protocol without complications, pt agreed to removal of catheter prior to removal. Both Nurse Tech Terese DoorSylvia Green with RN at beside.

## 2013-09-29 NOTE — Progress Notes (Signed)
Pt. Has been obsessed with paranoid thoughts regarding her significant other, her animals being divided, being " a ward of the state",  "my nurse Okey RegalCarol was suppose to take care of me until I died."

## 2013-09-29 NOTE — Plan of Care (Signed)
Problem: Phase II Progression Outcomes Goal: Bed to chair Outcome: Completed/Met Date Met:  09/29/13 Ambulated to chair 7/18

## 2013-09-30 LAB — GLUCOSE, CAPILLARY
GLUCOSE-CAPILLARY: 127 mg/dL — AB (ref 70–99)
GLUCOSE-CAPILLARY: 183 mg/dL — AB (ref 70–99)
GLUCOSE-CAPILLARY: 195 mg/dL — AB (ref 70–99)
GLUCOSE-CAPILLARY: 263 mg/dL — AB (ref 70–99)
Glucose-Capillary: 241 mg/dL — ABNORMAL HIGH (ref 70–99)
Glucose-Capillary: 60 mg/dL — ABNORMAL LOW (ref 70–99)
Glucose-Capillary: 147 mg/dL — ABNORMAL HIGH (ref 70–99)
Glucose-Capillary: 281 mg/dL — ABNORMAL HIGH (ref 70–99)
Glucose-Capillary: 119 mg/dL — ABNORMAL HIGH (ref 70–99)

## 2013-09-30 LAB — CBC WITH DIFFERENTIAL/PLATELET
Basophils Absolute: 0 10*3/uL (ref 0.0–0.1)
Basophils Relative: 0 % (ref 0–1)
EOS ABS: 0.4 10*3/uL (ref 0.0–0.7)
EOS PCT: 3 % (ref 0–5)
HCT: 34.3 % — ABNORMAL LOW (ref 36.0–46.0)
Hemoglobin: 11.6 g/dL — ABNORMAL LOW (ref 12.0–15.0)
LYMPHS ABS: 6.6 10*3/uL — AB (ref 0.7–4.0)
Lymphocytes Relative: 49 % — ABNORMAL HIGH (ref 12–46)
MCH: 29.7 pg (ref 26.0–34.0)
MCHC: 33.8 g/dL (ref 30.0–36.0)
MCV: 87.9 fL (ref 78.0–100.0)
MONO ABS: 1 10*3/uL (ref 0.1–1.0)
Monocytes Relative: 7 % (ref 3–12)
Neutro Abs: 5.6 10*3/uL (ref 1.7–7.7)
Neutrophils Relative %: 41 % — ABNORMAL LOW (ref 43–77)
PLATELETS: 526 10*3/uL — AB (ref 150–400)
RBC: 3.9 MIL/uL (ref 3.87–5.11)
RDW: 13.5 % (ref 11.5–15.5)
WBC: 13.6 10*3/uL — ABNORMAL HIGH (ref 4.0–10.5)

## 2013-09-30 LAB — COMPREHENSIVE METABOLIC PANEL
ALT: 14 U/L (ref 0–35)
AST: 25 U/L (ref 0–37)
Albumin: 3.5 g/dL (ref 3.5–5.2)
Alkaline Phosphatase: 86 U/L (ref 39–117)
Anion gap: 16 — ABNORMAL HIGH (ref 5–15)
BILIRUBIN TOTAL: 0.3 mg/dL (ref 0.3–1.2)
BUN: 19 mg/dL (ref 6–23)
CHLORIDE: 92 meq/L — AB (ref 96–112)
CO2: 25 meq/L (ref 19–32)
Calcium: 9.5 mg/dL (ref 8.4–10.5)
Creatinine, Ser: 0.82 mg/dL (ref 0.50–1.10)
GFR calc Af Amer: 88 mL/min — ABNORMAL LOW (ref >90)
GFR, EST NON AFRICAN AMERICAN: 76 mL/min — AB (ref >90)
GLUCOSE: 70 mg/dL (ref 70–99)
Potassium: 4.2 mEq/L (ref 3.7–5.3)
SODIUM: 133 meq/L — AB (ref 137–147)
Total Protein: 7.5 g/dL (ref 6.0–8.3)

## 2013-09-30 LAB — MAGNESIUM: Magnesium: 1.8 mg/dL (ref 1.5–2.5)

## 2013-09-30 LAB — PATHOLOGIST SMEAR REVIEW

## 2013-09-30 MED ORDER — ALPRAZOLAM 0.5 MG PO TABS: 0.5000 mg | ORAL_TABLET | Freq: Four times a day (QID) | ORAL | Status: DC | PRN

## 2013-09-30 MED ORDER — HEPARIN SODIUM (PORCINE) 5000 UNIT/ML IJ SOLN: 5000.0000 [IU] | Freq: Three times a day (TID) | INTRAMUSCULAR | Status: DC

## 2013-09-30 MED ORDER — OXYCODONE-ACETAMINOPHEN 10-325 MG PO TABS
1.0000 | ORAL_TABLET | ORAL | Status: DC | PRN
Start: 2013-09-30 — End: 2013-10-01

## 2013-09-30 MED ORDER — INSULIN GLARGINE 100 UNIT/ML ~~LOC~~ SOLN: 15.0000 [IU] | Freq: Every day | SUBCUTANEOUS | Status: AC

## 2013-09-30 MED ORDER — AMITRIPTYLINE HCL 75 MG PO TABS: 75.0000 mg | ORAL_TABLET | Freq: Every day | ORAL | Status: DC

## 2013-09-30 MED ORDER — PREDNISONE 5 MG PO TABS: 5.0000 mg | ORAL_TABLET | Freq: Every day | ORAL | Status: DC

## 2013-09-30 MED ORDER — INSULIN GLARGINE 100 UNIT/ML ~~LOC~~ SOLN: 28.0000 [IU] | Freq: Every day | SUBCUTANEOUS | Status: AC

## 2013-09-30 MED ORDER — FENTANYL 25 MCG/HR TD PT72: 25.0000 ug | MEDICATED_PATCH | TRANSDERMAL | Status: DC

## 2013-09-30 NOTE — Progress Notes (Signed)
SUBJECTIVE:  No active chest pain.  No SOB.     PHYSICAL EXAM Filed Vitals:   09/30/13 0000 09/30/13 0400 09/30/13 0402 09/30/13 0820  BP: 132/59 130/57 130/57 152/57  Pulse: 84 83 83 89  Temp:   98 F (36.7 C) 98.7 F (37.1 C)  TempSrc:   Oral Oral  Resp: 11 14 13 16   Height:      Weight:   155 lb 10.3 oz (70.6 kg)   SpO2: 97% 95% 95% 91%   General:  No acute distress Lungs:  Clear Heart:  RRR Abdomen:  Positive bowel sounds, no rebound no guarding Extremities:  No edema   LABS: Lab Results  Component Value Date   TROPONINI 0.46* 09/29/2013   Results for orders placed during the hospital encounter of 09/18/13 (from the past 24 hour(s))  GLUCOSE, CAPILLARY     Status: Abnormal   Collection Time    09/29/13 11:53 AM      Result Value Ref Range   Glucose-Capillary 169 (*) 70 - 99 mg/dL   Comment 1 Notify RN     Comment 2 Documented in Chart    TROPONIN I     Status: Abnormal   Collection Time    09/29/13 12:35 PM      Result Value Ref Range   Troponin I 0.46 (*) <0.30 ng/mL  GLUCOSE, CAPILLARY     Status: Abnormal   Collection Time    09/29/13  3:57 PM      Result Value Ref Range   Glucose-Capillary 318 (*) 70 - 99 mg/dL  GLUCOSE, CAPILLARY     Status: Abnormal   Collection Time    09/29/13  9:30 PM      Result Value Ref Range   Glucose-Capillary 241 (*) 70 - 99 mg/dL  GLUCOSE, CAPILLARY     Status: Abnormal   Collection Time    09/30/13  1:26 AM      Result Value Ref Range   Glucose-Capillary 127 (*) 70 - 99 mg/dL  COMPREHENSIVE METABOLIC PANEL     Status: Abnormal   Collection Time    09/30/13  2:50 AM      Result Value Ref Range   Sodium 133 (*) 137 - 147 mEq/L   Potassium 4.2  3.7 - 5.3 mEq/L   Chloride 92 (*) 96 - 112 mEq/L   CO2 25  19 - 32 mEq/L   Glucose, Bld 70  70 - 99 mg/dL   BUN 19  6 - 23 mg/dL   Creatinine, Ser 1.610.82  0.50 - 1.10 mg/dL   Calcium 9.5  8.4 - 09.610.5 mg/dL   Total Protein 7.5  6.0 - 8.3 g/dL   Albumin 3.5  3.5 - 5.2 g/dL     AST 25  0 - 37 U/L   ALT 14  0 - 35 U/L   Alkaline Phosphatase 86  39 - 117 U/L   Total Bilirubin 0.3  0.3 - 1.2 mg/dL   GFR calc non Af Amer 76 (*) >90 mL/min   GFR calc Af Amer 88 (*) >90 mL/min   Anion gap 16 (*) 5 - 15  CBC WITH DIFFERENTIAL     Status: Abnormal   Collection Time    09/30/13  2:50 AM      Result Value Ref Range   WBC 13.6 (*) 4.0 - 10.5 K/uL   RBC 3.90  3.87 - 5.11 MIL/uL   Hemoglobin 11.6 (*) 12.0 - 15.0 g/dL  HCT 34.3 (*) 36.0 - 46.0 %   MCV 87.9  78.0 - 100.0 fL   MCH 29.7  26.0 - 34.0 pg   MCHC 33.8  30.0 - 36.0 g/dL   RDW 40.9  81.1 - 91.4 %   Platelets 526 (*) 150 - 400 K/uL   Neutrophils Relative % 41 (*) 43 - 77 %   Lymphocytes Relative 49 (*) 12 - 46 %   Monocytes Relative 7  3 - 12 %   Eosinophils Relative 3  0 - 5 %   Basophils Relative 0  0 - 1 %   Neutro Abs 5.6  1.7 - 7.7 K/uL   Lymphs Abs 6.6 (*) 0.7 - 4.0 K/uL   Monocytes Absolute 1.0  0.1 - 1.0 K/uL   Eosinophils Absolute 0.4  0.0 - 0.7 K/uL   Basophils Absolute 0.0  0.0 - 0.1 K/uL   RBC Morphology POLYCHROMASIA PRESENT    MAGNESIUM     Status: None   Collection Time    09/30/13  2:50 AM      Result Value Ref Range   Magnesium 1.8  1.5 - 2.5 mg/dL  GLUCOSE, CAPILLARY     Status: Abnormal   Collection Time    09/30/13  4:04 AM      Result Value Ref Range   Glucose-Capillary 60 (*) 70 - 99 mg/dL  GLUCOSE, CAPILLARY     Status: Abnormal   Collection Time    09/30/13  6:31 AM      Result Value Ref Range   Glucose-Capillary 147 (*) 70 - 99 mg/dL  GLUCOSE, CAPILLARY     Status: Abnormal   Collection Time    09/30/13  8:10 AM      Result Value Ref Range   Glucose-Capillary 195 (*) 70 - 99 mg/dL    Intake/Output Summary (Last 24 hours) at 09/30/13 7829 Last data filed at 09/29/13 2200  Gross per 24 hour  Intake    486 ml  Output    350 ml  Net    136 ml    ASSESSMENT AND PLAN:  NSTEMI:  She is not describing any active chest pain.  She did some ambulation.   Plan for  continued medical management unless she has refractory symptoms.    CHRONIC SYSTOLIC HF:  EF 35 - 40%.    Seems to be euvolemic.   Continue current meds.       Fayrene Fearing [facility] 09/30/2013 9:03 AM

## 2013-09-30 NOTE — Discharge Summary (Addendum)
DISCHARGE SUMMARY  [name]  MR#: [id number]  DOB:[birth year]  Date of Admission: 09/18/2013 Date of Discharge: 10/01/2013  Attending Physician:[name].  Patient's Erika L, MD  Consults: Cardiology Orthopedics  Disposition: [name]/C to SNF for rehab stay   Follow-up Appts:     Follow-up Information   Follow up with MURPHY, TIMOTHY, D, MD In 2 weeks.   Specialty:  Orthopedic Surgery   Contact information:   [address] [place] [address] [phone]       Follow up with Erika Nose, MD. (Office will call you for your followup appointment)    Specialty:  Cardiology   Contact information:   [address] [place] [address] [place] [address] [phone]      Discharge Diagnoses: Anxiety/Acute delirium - Narcotic seeking behavior  NSTEMI /CAD - hx of multiple PCIs QT prolongation - transient Acute respiratory failure with hypoxia/Acute diastolic heart failure exacerbation  Diabetes mellitus type 1, uncontrolled, insulin dependent  Polymyalgia rheumatica on chronic steroids  HTN Hip fracture- surgery 09/19/13   Initial presentation: 63yo F w/ Hx CAD, mild systolic CHF, and chronic pain who was admitted 09/18/2013 after a mechanical fall causing right-sided femoral fracture. Evaluated by cardiology and orthopedics then underwent surgical correction by orthopedics on 09/19/2013. Blood pressure was slightly soft due to overuse of narcotics and due to relative adrenal insufficiency (chronic sterids due to PMR). Ant-HTN meds were adjusted and she was switched to stress dose steroids, with minimization of narcotics.  She developed CP and SOB overnight into 7/15. She did have mild bump in troponin 7/15 consistent with NSTEMI. EKG with inferolateral TW changes without elevation. Cardiology initiated a heparin drip and transferred to pt step down.  Hospital Course:  Anxiety/Acute delirium - Narcotic seeking behavior  -multifactorial:  prolonged hospitalization, recent stress dose steroids, recent change in narcotic dose, and suspected under dosing BZD's  -7/16: Patient had significant delirium and had been resumed on home dose Xanax (1mg ) so MD decreased Xanax to 0.5 mg q 6hr PRN anxiety  -7/16: Caregiver insisted patient receive full dose benzodiazepine and narcotics. MD Counseled caregiver this could actually be exacerbating her delirium/delusions in light of her acute health problems.  -7/16: dc'[name] Dilaudid-cont'[name] Vicodin and home Fentanyl patch  -in [name]/w pt partner [name] she mentioned that "the confusion was much worse this time" suggestive of prior issues with acute delirium during prior hospitalizations  -7/18 after having to shake patient vigorously to wake her from her sleep the first request was for additional pain medication. Discussed at length control of chronic pain. Goal is to insure pain is at manageable level preferably 3-4/10, and it was unrealistic to believe pain was going to decrease to 0/10. Patient appeared to understand why overmedication could cause serious side effects such as exacerbation of delirium, decreased pulmonary/cardiac function.  -7/19 the delirium has significantly resolved with decrease in benzodiazepine and narcotics   NSTEMI /CAD- multiple PCIs/Elevated troponin  -Cards managed  -Troponin peaked 6.94 with slow trend down  -planned cath dc'[name] with no plans to pursue in future  -cath Oct 2014 with diffuse CAD but no amenable PCI targets so focus will be on medical rxn  -cont BB, ASA, Plavix  -7/17: Cards dc'[name] IV Heparin and began Imdur and dc'[name] IV NTG  -Negative CP/SOB  -Troponin almost WNL. Cleared for [name]/C by cardiology   Transient QT prolongation  -Amitriptyline can cause/exacerbate QT prolongation will slowly titrate down. Decreased to 75 mg q HS on 7/19 and  further decreased to 50 mg on 7/21.  QT improved to .46 on 7/20 and was 0.32 on date of dc  Acute respiratory failure with hypoxia /  Acute diastolic heart failure exacerbation  -diuresed well on PO Lasix  -Repeat echo this admission noted normal LVEF of 55-60% with grade 1 diastolic dysfunction which is improved from systolic dysfunction seen on cardiac catheterization October 2014 at that time patient had an EF of 35-40%  -incontinent of urine due to AMS so inserted foley for accurate I/O and to protect skin  -7/18 resolved   Diabetes mellitus type 1, uncontrolled, insulin dependent  -7/18 hemoglobin A1c=10.4  -at home was on high am and low pm dose Lantus -7/19 Increased  Lantus to 28 units QAm, and Lantus 15 units QHS - will need to cont to follow CBG and adjust insulin as indicated   Polymyalgia rheumatica on chronic steroids  -on Prednisone-partner clarified home dose was 2.5 mg   HTN -controlled   Hip fracture- surgery 09/19/13  -PT recommends SNF for rehab     Medication List    STOP taking these medications       insulin lispro 100 UNIT/ML injection  Commonly known as:  HUMALOG      TAKE these medications       ALPRAZolam 0.5 MG tablet  Commonly known as:  XANAX  Take 1 tablet (0.5 mg total) by mouth 4 (four) times daily as needed for sleep or anxiety.     amitriptyline 50 MG tablet  Commonly known as:  ELAVIL  Take 1 tablet (50 mg total) by mouth at bedtime.     aspirin EC 81 MG tablet  Take 162 mg by mouth 2 (two) times daily.     Calcium Carb-Cholecalciferol 600-500 MG-UNIT Caps  Take 1 capsule by mouth daily.     carvedilol 25 MG tablet  Commonly known as:  COREG  Take 25 mg by mouth 2 (two) times daily with a meal.     clopidogrel 75 MG tablet  Commonly known as:  PLAVIX  Take 1 tablet (75 mg total) by mouth daily.     COD LIVER OIL PO  Take 1 capsule by mouth daily.     diclofenac sodium 1 % Gel  Commonly known as:  VOLTAREN  Apply 4 g topically daily as needed (muscle pain).     docusate sodium 100 MG capsule  Commonly known as:  COLACE  Take 1 capsule (100 mg total) by  mouth 2 (two) times daily.     feeding supplement (ENSURE) Pudg  Take 1 Container by mouth 2 (two) times daily between meals.     fentaNYL 25 MCG/HR patch  Commonly known as:  DURAGESIC - dosed mcg/hr  Place 1 patch (25 mcg total) onto the skin every 3 (three) days.     folic acid 800 MCG tablet  Commonly known as:  FOLVITE  Take 1,600 mcg by mouth daily.     furosemide 40 MG tablet  Commonly known as:  LASIX  Take 40-60 mg by mouth See admin instructions. Take 40 mg every  morning.  Take 20 mg every other evening.     heparin 5000 UNIT/ML injection  Inject 1 mL (5,000 Units total) into the skin every 8 (eight) hours.     insulin glargine 100 UNIT/ML injection  Commonly known as:  LANTUS  Inject 0.28 mLs (28 Units total) into the skin daily.     insulin glargine 100 UNIT/ML injection  Commonly  known as:  LANTUS  Inject 0.15 mLs (15 Units total) into the skin at bedtime.     iron polysaccharides 150 MG capsule  Commonly known as:  NU-IRON  Take 1 capsule (150 mg total) by mouth daily.     isosorbide mononitrate 60 MG 24 hr tablet  Commonly known as:  IMDUR  Take 1 tablet (60 mg total) by mouth 2 (two) times daily.     Lutein 20 MG Caps  Take 1 capsule by mouth daily.     nitroGLYCERIN 0.4 MG SL tablet  Commonly known as:  NITROSTAT  Place 0.4 mg under the tongue every 5 (five) minutes as needed for chest pain.     NORVASC 2.5 MG tablet  Generic drug:  amLODipine  Take 2.5 mg by mouth daily.     omeprazole 20 MG capsule  Commonly known as:  PRILOSEC  Take 20 mg by mouth daily.     oxyCODONE-acetaminophen 10-325 MG per tablet  Commonly known as:  PERCOCET  Take 1 tablet by mouth every 4 (four) hours as needed for pain.     potassium chloride SA 20 MEQ tablet  Commonly known as:  K-DUR,KLOR-CON  Take 30 mEq by mouth daily.     predniSONE 5 MG tablet  Commonly known as:  DELTASONE  Take 1 tablet (5 mg total) by mouth daily with breakfast.     sertraline 50 MG  tablet  Commonly known as:  ZOLOFT  Take 50 mg by mouth daily.     timolol 0.25 % ophthalmic solution  Commonly known as:  TIMOPTIC  Place 1 drop into both eyes daily.     TRAVATAN Z 0.004 % Soln ophthalmic solution  Generic drug:  Travoprost (BAK Free)  Place 1 drop into both eyes at bedtime.     triamcinolone cream 0.1 %  Commonly known as:  KENALOG  Apply 1 application topically daily. Apply to rash     valsartan 40 MG tablet  Commonly known as:  DIOVAN  Take 40 mg by mouth daily.     VITAMIN B-12 PO  Take 1 tablet by mouth daily.     VITAMIN B-6 PO  Take 1 tablet by mouth daily.     vitamin E 400 UNIT capsule  Take 400 Units by mouth daily.     ZINC GLUCONATE PO  Take by mouth.     zinc gluconate 50 MG tablet  Take 50 mg by mouth daily.       Day of Discharge BP 129/71  Pulse 81  Temp(Src) 97.5 F (36.4 C) (Oral)  Resp 25  Ht 5\' 4"  (1.626 m)  Wt 155 lb 10.3 oz (70.6 kg)  BMI 26.70 kg/m2  SpO2 100%  Physical Exam: General: No acute respiratory distress Lungs: Clear to auscultation bilaterally without wheezes or crackles Cardiovascular: Regular rate and rhythm without murmur gallop or rub  Abdomen: Nontender, nondistended, soft, bowel sounds positive, no rebound, no ascites, no appreciable mass Extremities: No significant cyanosis, clubbing, or edema bilateral lower extremities - R lateral hip wound w/ steristrips in place w/ surrounding ecchymosis but no erythema and no purulent [name]/c   COMPREHENSIVE METABOLIC PANEL     Status: Abnormal   Collection Time    09/30/13  2:50 AM      Result Value Ref Range   Sodium 133 (*) 137 - 147 mEq/[name]   Potassium 4.2  3.7 - 5.3 mEq/[name]   Chloride 92 (*) 96 - 112 mEq/[name]   CO2  25  19 - 32 mEq/[name]   Glucose, Bld 70  70 - 99 mg/dL   BUN 19  6 - 23 mg/dL   Creatinine, Ser 1.61  0.50 - 1.10 mg/dL   Calcium 9.5  8.4 - 09.6 mg/dL   Total Protein 7.5  6.0 - 8.3 g/dL   Albumin 3.5  3.5 - 5.2 g/dL   AST 25  0 - 37 U/[name]   ALT 14   0 - 35 U/[name]   Alkaline Phosphatase 86  39 - 117 U/[name]   Total Bilirubin 0.3  0.3 - 1.2 mg/dL   GFR calc non Af Amer 76 (*) >90 mL/min   GFR calc Af Amer 88 (*) >90 mL/min   Anion gap 16 (*) 5 - 15  CBC WITH DIFFERENTIAL     Status: Abnormal   Collection Time    09/30/13  2:50 AM      Result Value Ref Range   WBC 13.6 (*) 4.0 - 10.5 K/uL   RBC 3.90  3.87 - 5.11 MIL/uL   Hemoglobin 11.6 (*) 12.0 - 15.0 g/dL   HCT 04.5 (*) 40.9 - 81.1 %   MCV 87.9  78.0 - 100.0 fL   MCH 29.7  26.0 - 34.0 pg   MCHC 33.8  30.0 - 36.0 g/dL   RDW 91.4  78.2 - 95.6 %   Platelets 526 (*) 150 - 400 K/uL   Neutrophils Relative % 41 (*) 43 - 77 %   Lymphocytes Relative 49 (*) 12 - 46 %   Monocytes Relative 7  3 - 12 %   Eosinophils Relative 3  0 - 5 %   Basophils Relative 0  0 - 1 %   Neutro Abs 5.6  1.7 - 7.7 K/uL   Lymphs Abs 6.6 (*) 0.7 - 4.0 K/uL   Monocytes Absolute 1.0  0.1 - 1.0 K/uL   Eosinophils Absolute 0.4  0.0 - 0.7 K/uL   Basophils Absolute 0.0  0.0 - 0.1 K/uL   RBC Morphology POLYCHROMASIA PRESENT    MAGNESIUM     Status: None   Collection Time    09/30/13  2:50 AM      Result Value Ref Range   Magnesium 1.8  1.5 - 2.5 mg/dL    Time spent in discharge (includes decision making & examination of pt): >30 minutes  10/01/2013, 10:11 AM   Erika Silk, NP [facility] Office  [phone]  I have personally examined this patient and reviewed the entire database. I have reviewed the above note, made any necessary editorial changes, and agree with its content.  Lonia Blood, MD [facility]

## 2013-09-30 NOTE — Progress Notes (Signed)
Pt Note Due to pt is for SNF, pt is more appropriate for PT at 3x/week.  Frequency updated in chart and will see pt tomorrow.  Thanks.  [facility] Ingold,PT Acute Rehabilitation [phone] (pager)

## 2013-10-01 ENCOUNTER — Encounter: Payer: Self-pay | Admitting: Internal Medicine

## 2013-10-01 ENCOUNTER — Telehealth: Payer: Self-pay | Admitting: Internal Medicine

## 2013-10-01 LAB — COMPREHENSIVE METABOLIC PANEL
ALK PHOS: 87 U/L (ref 39–117)
ALT: 17 U/L (ref 0–35)
ANION GAP: 14 (ref 5–15)
AST: 24 U/L (ref 0–37)
Albumin: 3.5 g/dL (ref 3.5–5.2)
BUN: 20 mg/dL (ref 6–23)
CHLORIDE: 94 meq/L — AB (ref 96–112)
CO2: 24 mEq/L (ref 19–32)
Calcium: 9.3 mg/dL (ref 8.4–10.5)
Creatinine, Ser: 0.85 mg/dL (ref 0.50–1.10)
GFR, EST AFRICAN AMERICAN: 84 mL/min — AB (ref >90)
GFR, EST NON AFRICAN AMERICAN: 72 mL/min — AB (ref >90)
GLUCOSE: 106 mg/dL — AB (ref 70–99)
POTASSIUM: 3.8 meq/L (ref 3.7–5.3)
Sodium: 132 mEq/L — ABNORMAL LOW (ref 137–147)
Total Bilirubin: 0.3 mg/dL (ref 0.3–1.2)
Total Protein: 7.2 g/dL (ref 6.0–8.3)

## 2013-10-01 LAB — CBC WITH DIFFERENTIAL/PLATELET
BASOS ABS: 0 10*3/uL (ref 0.0–0.1)
Basophils Relative: 0 % (ref 0–1)
Eosinophils Absolute: 0.4 10*3/uL (ref 0.0–0.7)
Eosinophils Relative: 3 % (ref 0–5)
HCT: 34.4 % — ABNORMAL LOW (ref 36.0–46.0)
Hemoglobin: 11.6 g/dL — ABNORMAL LOW (ref 12.0–15.0)
LYMPHS PCT: 47 % — AB (ref 12–46)
Lymphs Abs: 6.3 10*3/uL — ABNORMAL HIGH (ref 0.7–4.0)
MCH: 29.8 pg (ref 26.0–34.0)
MCHC: 33.7 g/dL (ref 30.0–36.0)
MCV: 88.4 fL (ref 78.0–100.0)
Monocytes Absolute: 0.7 10*3/uL (ref 0.1–1.0)
Monocytes Relative: 5 % (ref 3–12)
NEUTROS PCT: 45 % (ref 43–77)
Neutro Abs: 6.1 10*3/uL (ref 1.7–7.7)
Platelets: 525 10*3/uL — ABNORMAL HIGH (ref 150–400)
RBC: 3.89 MIL/uL (ref 3.87–5.11)
RDW: 13.7 % (ref 11.5–15.5)
WBC: 13.5 10*3/uL — ABNORMAL HIGH (ref 4.0–10.5)

## 2013-10-01 LAB — GLUCOSE, CAPILLARY
Glucose-Capillary: 79 mg/dL (ref 70–99)
Glucose-Capillary: 127 mg/dL — ABNORMAL HIGH (ref 70–99)
Glucose-Capillary: 354 mg/dL — ABNORMAL HIGH (ref 70–99)

## 2013-10-01 LAB — MAGNESIUM: Magnesium: 1.9 mg/dL (ref 1.5–2.5)

## 2013-10-01 MED ORDER — AMITRIPTYLINE HCL 50 MG PO TABS: 50.0000 mg | ORAL_TABLET | Freq: Every day | ORAL | Status: DC

## 2013-10-01 MED ORDER — OXYCODONE-ACETAMINOPHEN 10-325 MG PO TABS: 1.0000 | ORAL_TABLET | ORAL | Status: DC | PRN

## 2013-10-01 MED ORDER — ALPRAZOLAM 0.5 MG PO TABS: 0.5000 mg | ORAL_TABLET | Freq: Four times a day (QID) | ORAL | Status: DC | PRN

## 2013-10-01 MED ORDER — AMITRIPTYLINE HCL 50 MG PO TABS
50.0000 mg | ORAL_TABLET | Freq: Every day | ORAL | Status: DC
  Filled 2013-10-01: qty 1

## 2013-10-01 MED ORDER — FENTANYL 25 MCG/HR TD PT72: 25.0000 ug | MEDICATED_PATCH | TRANSDERMAL | Status: AC

## 2013-10-01 NOTE — Telephone Encounter (Signed)
Closed encounter °

## 2013-10-01 NOTE — Progress Notes (Addendum)
Clinical Social Worker facilitated patient discharge including contacting patient, patient's significant other and facility to confirm patient discharge plans.  Clinical information faxed to facility and patient agreeable with plan.  Patient and patient's significant other state that the friend will [facility]. RN to call report prior to discharge.  Clinical Social Worker will sign off for now as social work intervention is no longer needed. Please consult us again if new need arises.  Maree KrabbeLindsay Tuesday Terlecki, MSW, Theresia MajorsLCSWA [phone]

## 2013-10-01 NOTE — Progress Notes (Signed)
[facility]. S/w Pattricia BossAnnie. All questions answered. Barbera Settersurner, Eliany Mccarter B RN

## 2013-10-01 NOTE — Progress Notes (Signed)
Protection TEAM 1 - Stepdown/ICU TEAM Progress Note  [name] ZOX:[id number] DOB: Nov 14, 1950 DOA: 09/18/2013 PCP: Erika Maudlin, MD  Admit HPI / Brief Narrative: [name] PMHx  CAD, mild systolic CHF, chronic pain. Admitted after mechanical fall causing right-sided femoral fracture. Evaluated by cardiology and orthopedics then underwent surgical correction by orthopedics on 09/19/2013. Blood pressure was slightly soft due to overuse of narcotics and due to relative adrenal insufficiency (chronic sterids due to PMR). Ant-HTN meds were adjusted and she was switched to stress dose steroids, minimize narcotics.  She developed CP and SOB overnight into 7/15. She did have mild bump in troponin 7/15 consistent with NSTEMI. EKG with inferolateral TW changes without elevation. Cardiology initiated a heparin drip and transferred to pt step down. 7/18 A./O. x4, NAD, when I entered room patient was sleeping soundly and required shaking fairly hard in order to wake her. Request suppository in order to have a BM. Patient also stated that some of her delirium and nightmares most likely related to the higher dose of prednisone she had received here in the hospital. States any dose greater than 10 mg causes these side effects.  HPI/Subjective: Alert and sitting up in chair  Assessment/Plan: Documented Drug-seeking behavior -The day physicians will be the only personnel to adjust pain/anxiety medication. -please see DC summary from 7/20  Anxiety/Acute delirium  -multifactorial: prolonged hospitalization, recent stress dose steroids, recent change in narcotic dose, and suspected under dosing BZD's  -7/16: Patient had significant delirium and had been resumed on home dose Xanax (1mg ) so MD decreased Xanax to 0.5 mg q 6hr PRN anxiety  -7/16: Caregiver insisted patient receive full dose benzodiazepine and narcotics. MD Counseled caregiver this could actually be exacerbating her delirium/delusions in light of her  acute health problems.  -7/16: dc'd Dilaudid-cont'd Vicodin and home Fentanyl patch  -in d/w pt partner [name] she mentioned that "the confusion was much worse this time" suggestive of prior issues with acute delirium during prior hospitalizations  -7/18 after having to shake patient vigorously to wake her from her sleep the first request was for additional pain medication. Discussed at length control of chronic pain. Goal is to insure pain is at manageable level preferably 3-4/10, and it was unrealistic to believe pain was going to decrease to 0/10. Patient appeared to understand why overmedication could cause serious side effects such as exacerbation of delirium, decreased pulmonary/cardiac function. -7/19 the delirium has significantly resolved with decrease in benzodiazepine and narcotics, baseline  -7/21: alert and only mildly confused at this juncture  NSTEMI /CAD- multiple PCIs/Elevated troponin  -Cards managing  -Troponin peaked 6.94 with slow trend down  -planned cath dc'd with no plans to pursue in future  -cath Oct 2014 with diffuse CAD but no amenable PCI targets so focus will be on medical rxn  -cont BB, ASA, Plavix  -7/17: Cards dc'd IV Heparin and began Imdur and dc'd IV NTG  -Negative CP/SOB -Troponin almost WNL. Cleared for D/C by cardiology   New QT prolongation -Amitriptyline can cause/exacerbate QT prolongation will slowly titrate down. Was decreased to 75 mg q HS 7/19 and further decreased to 50 mg on 7/20 -rhythm strip QT 0.32 on date of dc  Acute respiratory failure with hypoxia/Acute diastolic heart failure exacerbation  -diuresing well on PO Lasix  -Repeat echo this admission now shows normal LVEF of 55-60% with grade 1 diastolic dysfunction which is improved from systolic dysfunction seen on cardiac catheterization October 2014 at that time patient had an  EF of 35-40%  -incontinent of urine due to AMS so insert foley for accurate I/O and to protect skin  -7/18  resolved    Leukocytosis  -no fever or definitive source of an infection (see below re: UA)  -suspect elevated WBC from NSTEMI and not infection but will follow closely  -Trending down, afebrile continue to follow  Diabetes mellitus type 1, uncontrolled, insulin dependent  -7/18 hemoglobin A1c=10.4  -at home was on high am and low pm dose Lantus-titrate up as indicated  -cont senstive SSI-pt very sensitive to insulin and given delirium likely cannot make staff aware if glucose dropping (at baseline she had that awareness) -7/19 Increase Lantus to 28 units QAm, and Lantus 15 units QHS   Polymyalgia rheumatica on chronic steroids  -back on Prednisone-partner clarified home dose was 2.5 mg -7/18 decreased prednisone to 5 mg daily  HTN (hypertension)  -controlled   Hip fracture- surgery 09/19/13  -PT recommends SNF for rehab  -OT eval pending  -per SW notes had previously been accepted to [facility] and ready for dc      DVT prophylaxis: Convert to SQ heparin-pt with peripheral neuropathy and simple palpation of LEs caused pain so would not tolerate SCDs  Code Status: Full  Family Communication: None  Disposition Plan/Expected LOS: DC to SNF 7/21- please see previously dictated dc summery per Dr. Sharon Seller from 7/20     Consultants: Dr. Carolanne Grumbling (cardiology)   Procedure/Significant Events: 7/16 echocardiogram; - LVEF= 55%-to 60%. -(grade 1 diastolic dysfunction).    Culture 7/16 urine negative   Antibiotics: NA  DVT prophylaxis: Subcutaneous heparin   Devices NA  LINES / TUBES:  7/15 22ga right forearm    Continuous Infusions:   Objective: VITAL SIGNS: Temp: 97.5 F (36.4 C) (07/21 0758) Temp src: Oral (07/21 0758) BP: 129/71 mmHg (07/21 0800) Pulse Rate: 81 (07/21 0800) SPO2; FIO2:   Intake/Output Summary (Last 24 hours) at 10/01/13 1026 Last data filed at 09/30/13 2209  Gross per 24 hour  Intake   1163 ml  Output      1 ml  Net   1162 ml      Exam: General: A./O. x4, NAD, No acute respiratory distress Lungs: Clear to auscultation bilaterally without wheezes or crackles Cardiovascular: Regular rate and rhythm without murmur gallop or rub normal S1 and S2 Abdomen: Nontender, nondistended, soft, bowel sounds positive, no rebound, no ascites, no appreciable mass Extremities: No significant cyanosis, clubbing, or edema bilateral lower extremities. Right upper thigh bruising appropriate for patient with hip surgery; clearing  Data Reviewed: Basic Metabolic Panel:  Recent Labs Lab 09/27/13 0411 09/28/13 0240 09/29/13 0458 09/30/13 0250 10/01/13 0316  NA 141 131* 131* 133* 132*  K 3.4* 4.0 4.2 4.2 3.8  CL 97 90* 90* 92* 94*  CO2 30 24 24 25 24   GLUCOSE 88 230* 196* 70 106*  BUN 26* 28* 19 19 20   CREATININE 1.06 0.90 0.78 0.82 0.85  CALCIUM 9.3 8.8 9.3 9.5 9.3  MG  --   --  1.9 1.8 1.9   Liver Function Tests:  Recent Labs Lab 09/27/13 0411 09/29/13 0458 09/30/13 0250 10/01/13 0316  AST 19 19 25 24   ALT 13 13 14 17   ALKPHOS 80 82 86 87  BILITOT 0.3 0.4 0.3 0.3  PROT 7.3 7.3 7.5 7.2  ALBUMIN 3.4* 3.4* 3.5 3.5   No results found for this basename: LIPASE, AMYLASE,  in the last 168 hours No results found for this basename: AMMONIA,  in the last 168 hours CBC:  Recent Labs Lab 09/27/13 0411 09/28/13 0240 09/29/13 0458 09/30/13 0250 10/01/13 0316  WBC 19.4* 14.4* 13.2* 13.6* 13.5*  NEUTROABS  --   --  6.4 5.6 6.1  HGB 11.2* 10.3* 11.0* 11.6* 11.6*  HCT 33.2* 30.2* 33.0* 34.3* 34.4*  MCV 89.2 89.6 88.0 87.9 88.4  PLT 539* 492* 516* 526* 525*   Cardiac Enzymes:  Recent Labs Lab 09/25/13 0855 09/25/13 1424 09/25/13 2020 09/26/13 0139 09/29/13 1235  TROPONINI 3.21* 4.52* 6.94* 5.80* 0.46*   BNP (last 3 results)  Recent Labs  09/18/13 2221  PROBNP 700.3*   CBG:  Recent Labs Lab 09/30/13 2015 09/30/13 2332 10/01/13 0428 10/01/13 0756 10/01/13 1011  GLUCAP 119* 263* 79 127* 354*     Recent Results (from the past 240 hour(s))  URINE CULTURE     Status: None   Collection Time    09/26/13 10:10 AM      Result Value Ref Range Status   Specimen Description URINE, RANDOM   Final   Special Requests NONE   Final   Culture  Setup Time     Final   Value: 09/26/2013 18:26     Performed at [facility]   Colony Count     Final   Value: NO GROWTH     Performed at [facility]   Culture     Final   Value: NO GROWTH     Performed at [facility]   Report Status 09/27/2013 FINAL   Final     Studies:  Recent x-ray studies have been reviewed in detail by the Attending Physician  Scheduled Meds:  Scheduled Meds: . amitriptyline  50 mg Oral QHS  . amLODipine  2.5 mg Oral Daily  . aspirin EC  162 mg Oral BID  . calcium-vitamin D  1 tablet Oral Daily  . carvedilol  12.5 mg Oral BID WC  . clopidogrel  75 mg Oral Daily  . feeding supplement (ENSURE)  1 Container Oral BID BM  . fentaNYL  25 mcg Transdermal Q72H  . folic acid  0.5 mg Oral Daily  . furosemide  40 mg Oral Daily  . haloperidol lactate  2 mg Intravenous Once  . heparin subcutaneous  5,000 Units Subcutaneous 3 times per day  . insulin aspart  0-15 Units Subcutaneous 6 times per day  . insulin glargine  28 Units Subcutaneous Daily   And  . insulin glargine  15 Units Subcutaneous QHS  . iron polysaccharides  150 mg Oral Daily  . isosorbide mononitrate  60 mg Oral BID  . latanoprost  1 drop Both Eyes QHS  . LORazepam  0.5 mg Intravenous Once  . pantoprazole  40 mg Oral QAC breakfast  . potassium chloride SA  30 mEq Oral Daily  . predniSONE  5 mg Oral Q breakfast  . sertraline  50 mg Oral Daily  . sodium chloride  3 mL Intravenous Q12H  . timolol  1 drop Both Eyes Daily    Time spent on care of this patient: 40 mins   ELLIS,ALLISON L. , ANP   [facility] Office  [phone] Pager - [phone]  On-Call/Text Page:      [email or web address]      password [email or web address]  If  7PM-7AM, please contact night-coverage [email or web address] Password [email or web address] 10/01/2013, 10:26 AM   LOS: 13 days     Examined patient with ANP Revonda StandardAllison and discussed assessment and plan. Agree with above plan. Patient  with multiple complex medical issues direct patient care> 40 minutes. Answered all questions by patient and her partner.

## 2013-10-01 NOTE — Progress Notes (Signed)
Inpatient Diabetes Program Recommendations  AACE/ADA: New Consensus Statement on Inpatient Glycemic Control (2013)  Target Ranges:  Prepandial:   less than 140 mg/dL      Peak postprandial:   less than 180 mg/dL (1-2 hours)      Critically ill patients:  140 - 180 mg/dL   Results for [name], [name] (MRN [id number]) as of 10/01/2013 09:38  Ref. Range 09/30/2013 08:10 09/30/2013 11:56 09/30/2013 16:38 09/30/2013 20:15 09/30/2013 23:32 10/01/2013 04:28 10/01/2013 07:56  Glucose-Capillary Latest Range: 70-99 mg/dL 829195 (H) 562183 (H) 130281 (H) 119 (H) 263 (H) 79 127 (H)   Diabetes history: DM1  Outpatient Diabetes medications: Lantus 25 units QAM (between 8-9 am, Lantus 8 units Q evening (at 6pm), Humalog 5-12 units TID with meals  Current orders for Inpatient glycemic control: Lantus 28 units QAM, Lantus 15 units QPM, Novolog 0-15 units Q4H  Inpatient Diabetes Program Recommendations Insulin - Basal: Noted bedtime dose of Lantus was NOT given last night (charted as patient/family refused). If patient remains inpatient, please consider decreasing evening Lantus dose to 10 units QHS. Outpatient Diabetes Medications: At time of discharge, please consider adjusting Lantus dose closer to doseages being used as an inpatient and continue Humalog for correction (as patient is [name] type 1 diabetic and requires basal and bolus insulin).  Thanks, Erika PennerMarie Dresden Ament, RN, MSN, CCRN Diabetes Coordinator  Inpatient Diabetes Program [phone] (Team Pager) [phone] ([facility] office) [phone] [facility] office)

## 2013-10-01 NOTE — Progress Notes (Signed)
Physical Therapy Treatment Patient Details Name: Erika DerryJudy A Advincula MRN: [id number] DOB: 12-29-1950 Today's Date: 10/01/2013    History of Present Illness pt presents post fall sustaining R hip fx, now post cannulated hip pinning; post-op NSTEMI with delirium in step-down unit  PMHx- DM, COPD, polymyalgia rheumatica, CAD, CVA, near deafness, multiple cardiac caths/procedures    PT Comments    Pt progressing well towards physical therapy goals. Was willing to ambulate farther with RW, and anxiety appeared to be less this session. O2 sats decreased during gait training on RA, with lows in the upper 70's. Attribute this partially to tight grip pt had on walker handles, however once seated at rest sats remained in high 80's for 2 minutes before improving into the 90's again.  Pt anticipates d/c to SNF today.   Follow Up Recommendations  SNF     Equipment Recommendations  Rolling walker with 5" wheels;3in1 (PT)    Recommendations for Other Services       Precautions / Restrictions Precautions Precautions: Fall Restrictions Weight Bearing Restrictions: Yes RLE Weight Bearing: Weight bearing as tolerated    Mobility  Bed Mobility Overal bed mobility: Needs Assistance Bed Mobility: Supine to Sit     Supine to sit: Min guard;HOB elevated     General bed mobility comments: Min guard for trunk elevation as pt transitioned to sitting position on EOB. Able to initiate movement well with the surgical well.  Transfers Overall transfer level: Needs assistance Equipment used: Rolling walker (2 wheeled) Transfers: Sit to/from Stand Sit to Stand: Min guard         General transfer comment: Pt able to power-up to full standing position without physical assist. VC's for hand placement on seated surface for safety.   Ambulation/Gait Ambulation/Gait assistance: Min guard Ambulation Distance (Feet): 30 Feet Assistive device: Rolling walker (2 wheeled) Gait Pattern/deviations: Step-to  pattern;Decreased stride length;Trunk flexed Gait velocity: Decreased Gait velocity interpretation: Below normal speed for age/gender General Gait Details: VC's for improved posture and safety awareness with the RW. Pt with tight grip on walker, and cued to relax upper body and hands, as she appeared to be weight bearing well through the RLE.    Stairs            Wheelchair Mobility    Modified Rankin (Stroke Patients Only)       Balance Overall balance assessment: History of Falls;Needs assistance Sitting-balance support: Feet supported;No upper extremity supported Sitting balance-Leahy Scale: Fair     Standing balance support: Bilateral upper extremity supported;During functional activity Standing balance-Leahy Scale: Poor Standing balance comment: Pt requires UE support for any dynamic movement.                     Cognition Arousal/Alertness: Awake/alert Behavior During Therapy: WFL for tasks assessed/performed;Impulsive Overall Cognitive Status: Within Functional Limits for tasks assessed                 General Comments: Required VC's to wait for therapist, as she was impulsive with initiating movement.     Exercises General Exercises - Lower Extremity Ankle Circles/Pumps: 15 reps Quad Sets: 15 reps Short Arc Quad: 15 reps Heel Slides: 15 reps Hip ABduction/ADduction: 15 reps Straight Leg Raises: 15 reps    General Comments        Pertinent Vitals/Pain See above for O2 sats during gait training.     Home Living  Prior Function            PT Goals (current goals can now be found in the care plan section) Acute Rehab PT Goals Patient Stated Goal: to go to rehab and then home PT Goal Formulation: With patient Time For Goal Achievement: 10/04/13 Potential to Achieve Goals: Good Progress towards PT goals: Progressing toward goals    Frequency  Min 3X/week    PT Plan Current plan remains appropriate     Co-evaluation             End of Session Equipment Utilized During Treatment: Gait belt Activity Tolerance: Patient limited by fatigue Patient left: in chair;with call bell/phone within reach;with family/visitor present     Time: 9604-5409 PT Time Calculation (min): 38 min  Charges:  $Gait Training: 8-22 mins $Therapeutic Exercise: 8-22 mins $Therapeutic Activity: 8-22 mins                    G Codes:      Ruthann Cancer 03-Oct-2013, 9:52 AM  Ruthann Cancer, PT, DPT Acute Rehabilitation Services Pager: [phone]

## 2013-10-02 ENCOUNTER — Telehealth: Payer: Self-pay | Admitting: Internal Medicine

## 2013-10-02 NOTE — Telephone Encounter (Signed)
Closed encounter °

## 2013-10-16 ENCOUNTER — Ambulatory Visit: Payer: Medicare Other | Admitting: Cardiology

## 2013-10-21 ENCOUNTER — Ambulatory Visit (INDEPENDENT_AMBULATORY_CARE_PROVIDER_SITE_OTHER): Payer: Medicare Other | Admitting: Internal Medicine

## 2013-10-21 ENCOUNTER — Encounter: Payer: Self-pay | Admitting: Internal Medicine

## 2013-10-21 VITALS — BP 119/53 | HR 72 | Ht 64.0 in | Wt 165.0 lb

## 2013-10-21 DIAGNOSIS — I214 Non-ST elevation (NSTEMI) myocardial infarction: Secondary | ICD-10-CM

## 2013-10-21 DIAGNOSIS — M353 Polymyalgia rheumatica: Secondary | ICD-10-CM

## 2013-10-21 DIAGNOSIS — I2589 Other forms of chronic ischemic heart disease: Secondary | ICD-10-CM

## 2013-10-21 DIAGNOSIS — Z79899 Other long term (current) drug therapy: Secondary | ICD-10-CM

## 2013-10-21 DIAGNOSIS — E0843 Diabetes mellitus due to underlying condition with diabetic autonomic (poly)neuropathy: Secondary | ICD-10-CM

## 2013-10-21 DIAGNOSIS — R600 Localized edema: Secondary | ICD-10-CM

## 2013-10-21 DIAGNOSIS — I2510 Atherosclerotic heart disease of native coronary artery without angina pectoris: Secondary | ICD-10-CM

## 2013-10-21 DIAGNOSIS — I209 Angina pectoris, unspecified: Secondary | ICD-10-CM

## 2013-10-21 DIAGNOSIS — R609 Edema, unspecified: Secondary | ICD-10-CM

## 2013-10-21 DIAGNOSIS — R0602 Shortness of breath: Secondary | ICD-10-CM

## 2013-10-21 DIAGNOSIS — E1349 Other specified diabetes mellitus with other diabetic neurological complication: Secondary | ICD-10-CM

## 2013-10-21 DIAGNOSIS — IMO0002 Reserved for concepts with insufficient information to code with codable children: Secondary | ICD-10-CM

## 2013-10-21 DIAGNOSIS — I10 Essential (primary) hypertension: Secondary | ICD-10-CM

## 2013-10-21 DIAGNOSIS — S72009S Fracture of unspecified part of neck of unspecified femur, sequela: Secondary | ICD-10-CM

## 2013-10-21 DIAGNOSIS — E1065 Type 1 diabetes mellitus with hyperglycemia: Secondary | ICD-10-CM

## 2013-10-21 DIAGNOSIS — I25119 Atherosclerotic heart disease of native coronary artery with unspecified angina pectoris: Secondary | ICD-10-CM

## 2013-10-21 DIAGNOSIS — G909 Disorder of the autonomic nervous system, unspecified: Secondary | ICD-10-CM

## 2013-10-21 DIAGNOSIS — I255 Ischemic cardiomyopathy: Secondary | ICD-10-CM

## 2013-10-21 NOTE — Progress Notes (Signed)
OFFICE NOTE  Chief Complaint:  Hospital follow-up, not as fatigued, BP low  Primary Care Physician: Erika Maudlin, MD  HPI:  [name] is a 63 y/o female, previously followed by Dr. Alanda [name], who presented with a complaint of chest pain. Her cardiac history is significant for CAD, s/p CABG in 1995 with a LIMA to LAD, saphenous vein graft to OM1, saphenous vein graft to a diagonal, and saphenous vein graft to the right coronary artery. Her last LHC was in 2012 for evaluation of chest pain. It was performed by Dr. Rennis [name]. She was found to have patent grafts. 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 She was started on BID Ranexa. Her history is also significant for polymyalgia rheumatica and diabetes mellitus. She presented to the ER today with a complaint of severe substernal chest pressure, off and on for the last 3 days. It is described as pressure/tight pain with radiation down her arms and back. No relief with SL NTG. Her pain currently is 7/10. Telemetry showed ST depressions. POC troponin in the ER is elevated at 0.19. She was admitted and started on IV NTG, given MSO4 with improvement. Blood sugar was 572. She was given insulin. Integrelin started. Left heart cath revealed an RCA that was occluded proximally, 95% distal stenosis of the SVG to Diag(see full report below). She underwent successful PCI and stenting of a high-grade diagonal branch SVG lesion. She was treated with ASA and plavix. She had a hypoglycemic episode which was treated and improved to 92. Hgb A1C was 10.2. A long discussion was had with the patient regarding the need for follow up with Dr. Evlyn [name] and the need for better diabetes mgt. The diabetes coordinator was consulted. Ranexa was started at  B[facility].  After discharge the family had discontinued a few of her medicines including Ranexa which apparently had caused her liver enzyme  abnormalities in the past. She was also markedly hypotensive and was seen in followup in Dr. Malena [name] office with blood pressures of 70/40 and having associated fatigue and lethargy. I recommended holding some of her medications and if her blood pressure did not improve that she may need readmission. She follows up today and reports marked improvement in her symptoms and her blood pressure has indeed improved.   [name] continues to have multiple medical problems. She reports recently that she had chest discomfort and emotional discomfort with the loss of her dog. She reported significant angina and was taking sublingual nitroglycerin and nitroglycerin several times a day with minimal improvement in her symptoms. In addition, she takes Imdur 60 mg twice daily. She was intolerant 2 Ranexa. She has known insufficient collaterals and unlikely targets for intervention. She is not a candidate for repeat cardiac surgery or intervention. She was placed on a Duragesic patch, which I think is helpful for her however she often forgets to change her patch and they may be a cause of her symptoms.  Unfortunately, she was recently admitted with a hip fracture. She had a subsequently repaired however she developed acute delirium post procedure and took up quite a while to recover. A repeat echocardiogram did show an improvement in her ejection fraction up to 55-60%. This is compared to her catheterization in 2014 which showed an EF of 35-40%. Unfortunately she suffered a non-STEMI during that hospitalization, however given her significant coronary disease with poor chart it's for intervention, cardiac  catheterization was not recommended. I recommended medical therapy.  Since discharge she reports she's been swelling and has had about 10 pound weight gain. She does report some increased appetite however I do not think it represents all of the weight gain.  Low blood pressures at times have been an issue as well. It seems that  she has a systolic differential between both arms of about 20 mmHg.  PMHx:  Past Medical History  Diagnosis Date  . Respiratory arrest   . COPD (chronic obstructive pulmonary disease)   . CHF (congestive heart failure)   . Diabetes mellitus     insulin-dep  . Fibromyalgia   . Deaf     bilateral neurosensory hearing loss - Dr. Narda Bondshris Newman  . Myocardial infarction   . Stroke   . GERD (gastroesophageal reflux disease)   . Depression   . Hypertension   . Pneumonia   . Coronary artery disease     a. h/o CABG  . Polymyalgia rheumatica   . Unstable angina   . PAD (peripheral artery disease)     LEAs 05/10/2012 - L SFA 50-69% diameter reduction, bilat ICA with 0-49% diameter reduction  . Tobacco abuse   . History of nuclear stress test 11/2011    lexiscan; low risk, non-diagnostic for ischemia    Past Surgical History  Procedure Laterality Date  . Coronary artery bypass graft  04/03/1993    x5 LIMA to LAD; SVG to DX1; SVG to RCA, SVG to OM;  (Dr. Tyrone SageGerhardt)  . Eye surgery    . Coronary angioplasty  1995    RCA PTCA (07/23/1993); mid-LAD PTCA (08/21/1993); PTCA for restenosis RCA (09/13/1993),   . Coronary angioplasty  1998    Cfx & LAD PTCA  . Coronary angioplasty  1999    Cfx & LAD PTCA  . Coronary angioplasty  09/17/2004    cutting balloon small vessel mid LAD beyond LIMA insertion ([id number] Cypher to mid SVG to RCA  . Coronary angioplasty  05/21/2010    patent LIMA-LAD, diffuse disease distally, patent SVG-small diagonal, patent SVG-right w/widely patent stent, patent SVG-OM1 (Dr. Kirtland BouchardK. [place] Shawnya Mayor)  . Transthoracic echocardiogram  12/2010    EF=>55%, mild conc LVH; IVC non-dilated w/>50% collapse; mild mitral annular calcif, calcified mitral apparatus, mild MD; mild TR & elevated RVSP; mild-mod AV regurg  . Coronary angioplasty  07/18/2008    patent SVG to acute marginal & PDA, SVG to OM1, SVG to diagonal 1, & LIMA to LAD (Dr. Evlyn CourierJ. Gangi(  . Coronary angioplasty  04/20/2007    patent  SVG-diagonal, SVG-obtuse marginal, SVG-RCA, LIMA-LAD, patent stent to SVG in RCA (Dr. Claudia DesanctisH. Solomon)  . Cardiac catheterization  04/10/2007    [id number] Promus DES to SVG to RCA  . Coronary angioplasty  03/22/2006    patent LIMA to LAD, patent SVG to small diagonal, patent SVG to OM, patent vein graft to RV acute marginal & RCA (Dr. Laurell Josephs. McQueen)  . Coronary angioplasty  12/03/2002    ballon angioplasty to lesion in prox LAD, prox Cfx & stenting, angioplasty to L AV groove artery (Dr. Jonette Eva. Weintraub)  . Coronary angioplasty  09/08/2000    Dr. Jonette Eva. Weintraub  . Hip pinning,cannulated  09/19/2013  . Hip pinning,cannulated Right 09/19/2013    Procedure: CANNULATED HIP PINNING;  Surgeon: Sheral Apleyimothy D Murphy, MD;  Location: [facility] OR;  Service: Orthopedics;  Laterality: Right;   Fracture table, c-arm, [employer]- Mr. Eulah PontMurphy to call rep    FAMHx:  Family History  Problem Relation Age of Onset  . Hypertension Father     SOCHx:   reports that she quit smoking about 5 years ago. She does not have any smokeless tobacco history on file. She reports that she does not drink alcohol or use illicit drugs.  ALLERGIES:  Allergies  Allergen Reactions  . Codeine     Unknown  . Flagyl [Metronidazole]     Unknown  . Fluocinonide     Unknown  . Ranexa [Ranolazine] Other (See Comments)    Elevated liver function  . Statins Other (See Comments)    Muscle aches  . Biaxin [Clarithromycin] Rash  . Demerol [Meperidine] Rash    ROS: A comprehensive review of systems was negative except for: Constitutional: positive for fatigue Eyes: positive for vision loss, glaucoma Cardiovascular: positive for lower extremity edema Neurological: positive for dizziness and weakness Behavioral/Psych: positive for anxiety  HOME MEDS: Current Outpatient Prescriptions  Medication Sig Dispense Refill  . ALPRAZolam (XANAX) 1 MG tablet Take 1 mg by mouth every 6 (six) hours as needed for anxiety.      Marland Kitchen amitriptyline (ELAVIL) 50 MG tablet Take 50  mg by mouth 2 (two) times daily.      Marland Kitchen aspirin EC 81 MG tablet Take 162 mg by mouth 2 (two) times daily.      . Calcium Carb-Cholecalciferol 600-500 MG-UNIT CAPS Take 1 capsule by mouth daily.      . carvedilol (COREG) 25 MG tablet Take 25 mg by mouth 2 (two) times daily with a meal.      . clopidogrel (PLAVIX) 75 MG tablet Take 1 tablet (75 mg total) by mouth daily.  30 tablet  11  . COD LIVER OIL PO Take 1 capsule by mouth daily.       . Cyanocobalamin (VITAMIN B-12 PO) Take 1 tablet by mouth daily.      Marland Kitchen docusate sodium (COLACE) 100 MG capsule Take 1 capsule (100 mg total) by mouth 2 (two) times daily.  60 capsule  0  . fentaNYL (DURAGESIC - DOSED MCG/HR) 25 MCG/HR patch Place 1 patch (25 mcg total) onto the skin every 3 (three) days.  5 patch  0  . folic acid (FOLVITE) 800 MCG tablet Take 1,600 mcg by mouth daily.      . furosemide (LASIX) 40 MG tablet Take 40-60 mg by mouth See admin instructions. Take 40 mg every  morning.  Take 20 mg every other evening.      . insulin glargine (LANTUS) 100 UNIT/ML injection Inject 0.28 mLs (28 Units total) into the skin daily.  10 mL  11  . insulin glargine (LANTUS) 100 UNIT/ML injection Inject 0.15 mLs (15 Units total) into the skin at bedtime.  10 mL  11  . iron polysaccharides (NU-IRON) 150 MG capsule Take 1 capsule (150 mg total) by mouth daily.      . isosorbide mononitrate (IMDUR) 60 MG 24 hr tablet Take 1 tablet (60 mg total) by mouth 2 (two) times daily.  60 tablet  10  . Lutein 20 MG CAPS Take 1 capsule by mouth daily.      . nitroGLYCERIN (NITROSTAT) 0.4 MG SL tablet Place 0.4 mg under the tongue every 5 (five) minutes as needed for chest pain.      Marland Kitchen omeprazole (PRILOSEC) 20 MG capsule Take 20 mg by mouth daily.      Marland Kitchen oxyCODONE-acetaminophen (PERCOCET) 10-325 MG per tablet Take 1 tablet by mouth every 4 (four) hours as needed for  pain.  30 tablet  0  . potassium chloride SA (K-DUR,KLOR-CON) 20 MEQ tablet Take 30 mEq by mouth daily.      .  predniSONE (DELTASONE) 5 MG tablet Take 1 tablet (5 mg total) by mouth daily with breakfast.      . Pyridoxine HCl (VITAMIN B-6 PO) Take 1 tablet by mouth daily.      . sertraline (ZOLOFT) 50 MG tablet Take 50 mg by mouth daily.      . timolol (TIMOPTIC) 0.25 % ophthalmic solution Place 1 drop into both eyes daily.       . TRAVATAN Z 0.004 % SOLN ophthalmic solution Place 1 drop into both eyes at bedtime.      . valsartan (DIOVAN) 40 MG tablet Take 40 mg by mouth daily.      . vitamin E 400 UNIT capsule Take 400 Units by mouth daily.      Marland Kitchen zinc gluconate 50 MG tablet Take 50 mg by mouth daily.      Marland Kitchen ZINC GLUCONATE PO Take by mouth.       No current facility-administered medications for this visit.    LABS/IMAGING: No results found for this or any previous visit (from the past 48 hour(s)). No results found.  VITALS: BP 119/53  Pulse 72  Ht 5\' 4"  (1.626 m)  Wt 165 lb (74.844 kg)  BMI 28.31 kg/m2  EXAM: General appearance: alert, appears older than stated age, pale and no distress Neck: no carotid bruit, no JVD and thyroid not enlarged, symmetric, no tenderness/mass/nodules Lungs: clear to auscultation bilaterally Heart: regular rate and rhythm, S1, S2 normal, no murmur, click, rub or gallop Abdomen: soft, non-tender; bowel sounds normal; no masses,  no organomegaly Extremities: edema 1+ pitting in the LLE, 2+ in the RLE, some facial swelling Pulses: 2+ and symmetric Skin: Pale, cool dry Neurologic: Mental status: Alert, oriented, thought content appropriate, hearing is signficantly impaired Psych: Anxious, tearful at times  EKG: Normal sinus rhythm at 72  ASSESSMENT: 1. NSTEMI with recent PCI to SVG to RCA (3.0 x 15 Xience expedition DES) in 2013, no further percutaneous options 2. CABG in 1995 with a LIMA to LAD, saphenous vein graft to OM1, saphenous vein graft to a diagonal, and saphenous vein graft to the right coronary artery. 3. Poorly controlled diabetes on insulin  with recent blood sugar of 574 in the hospital 4. Diabetic retinopathy, sensorineural hearing loss, and peripheral neuropathy 5. Dyslipidemia 6. PMR 7. Anxiety 8. 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 9. Recent hip fracture. 10. Ischemic cardiomyopathy, EF 35-40% (now improved to 55-60% in 2015) 11. Acute diastolic congestive heart failure  PLAN: 1.   Mrs. Nolton recently had a hip fracture which was complicated by significant delirium related to pain medications and anxiolytics. An echo shows that her EF has improved however since discharge she's had 10 pound weight gain, albeit she has had a better appetite, there is significant lower extremity swelling and mild bibasilar crackles. I suspect this is acute diastolic congestive heart failure. I've recommended an increase in her Lasix dose to 40 mg twice a day with the addition of 2.5 mg of Zaroxolyn in the morning. This is for 3 days and then she should stop the Zaroxolyn. I will plan to get a followup BMP and BNP in 3 days. She has a followup appointment with her primary care provider next week and I will try to get an appointment with her in 2 weeks. We may be able to  wean down her Lasix if we can get her dry weight back to around 155 to 160.  Chrystie Nose, MD, [facility] Attending Cardiologist [facility]  Kia Stavros C 10/21/2013, 4:13 PM

## 2013-10-21 NOTE — Patient Instructions (Addendum)
Your physician has recommended you make the following change in your medication...  1. Take metolazone 2.5mg  every morning (30 minutes to AM dose of lasix) for 3 days Tuesday, Wednesday, Thursday 2. Take 40mg  lasix twice daily for 3 days (Tuesday, Wedneday, Thursday) 3. On Friday - STOP METOLAZONE and continue to take lasix 40mg  twice daily.  4. STOP amlodipine  Your physician recommends that you return for lab work ON Thursday  Monitor your weights daily. Please bring a list of your weights to your office visit  Your physician recommends that you schedule a follow-up appointment in: 2 weeks with Dr. Rennis GoldenHilty (OK to double book)

## 2013-10-25 LAB — BASIC METABOLIC PANEL
BUN: 26 mg/dL — ABNORMAL HIGH (ref 6–23)
CO2: 29 mEq/L (ref 19–32)
Calcium: 9.1 mg/dL (ref 8.4–10.5)
Chloride: 88 mEq/L — ABNORMAL LOW (ref 96–112)
Creat: 1.03 mg/dL (ref 0.50–1.10)
GLUCOSE: 277 mg/dL — AB (ref 70–99)
Potassium: 3.9 mEq/L (ref 3.5–5.3)
SODIUM: 129 meq/L — AB (ref 135–145)

## 2013-10-25 LAB — BRAIN NATRIURETIC PEPTIDE: BRAIN NATRIURETIC PEPTIDE: 120.9 pg/mL — AB (ref 0.0–100.0)

## 2013-10-28 ENCOUNTER — Encounter: Payer: Self-pay | Admitting: Internal Medicine

## 2013-10-30 ENCOUNTER — Telehealth: Payer: Self-pay | Admitting: *Deleted

## 2013-10-30 NOTE — Telephone Encounter (Signed)
Spoke with [name]. Informed her that per Dr. Rennis GoldenHilty, she should stay on amlodipine. (user entry error on last AVS? - notes to stop this med).   [name] reports patient has been off of medication and still has low BP readings 110/40 and 80/50 (today 8/19) - [name] reports she could barely participate in rehab today r/t fatigue (due to low BP)

## 2013-10-30 NOTE — Telephone Encounter (Signed)
Ok .. Then have her stop the amlodipine.  Dr. HRexene Edison

## 2013-10-31 ENCOUNTER — Encounter: Payer: Self-pay | Admitting: *Deleted

## 2013-10-31 NOTE — Telephone Encounter (Signed)
Message sent via MyChart - received by patient. Reminded of OV 8/31

## 2013-11-11 ENCOUNTER — Ambulatory Visit (INDEPENDENT_AMBULATORY_CARE_PROVIDER_SITE_OTHER): Payer: Medicare Other | Admitting: Internal Medicine

## 2013-11-11 ENCOUNTER — Encounter: Payer: Self-pay | Admitting: Internal Medicine

## 2013-11-11 VITALS — BP 119/58 | HR 66 | Ht 64.0 in | Wt 164.0 lb

## 2013-11-11 DIAGNOSIS — I255 Ischemic cardiomyopathy: Secondary | ICD-10-CM

## 2013-11-11 DIAGNOSIS — I2510 Atherosclerotic heart disease of native coronary artery without angina pectoris: Secondary | ICD-10-CM

## 2013-11-11 DIAGNOSIS — I2589 Other forms of chronic ischemic heart disease: Secondary | ICD-10-CM

## 2013-11-11 NOTE — Patient Instructions (Signed)
Dr.Hilty wants you to follow-up in: 4 months You will receive a reminder letter in the mail two months in advance. If you don't receive a letter, please call our office to schedule the follow-up appointment.

## 2013-11-11 NOTE — Progress Notes (Signed)
OFFICE NOTE  Chief Complaint:  Hospital follow-up, not as fatigued, BP low  Primary Care Physician: Erika Maudlin, MD  HPI:  [name] is a 63 y/o female, previously followed by Dr. Alanda [name], who presented with a complaint of chest pain. Her cardiac history is significant for CAD, s/p CABG in 1995 with a LIMA to LAD, saphenous vein graft to OM1, saphenous vein graft to a diagonal, and saphenous vein graft to the right coronary artery. Her last LHC was in 2012 for evaluation of chest pain. It was performed by Dr. Rennis [name]. She was found to have patent grafts. 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 She was started on BID Ranexa. Her history is also significant for polymyalgia rheumatica and diabetes mellitus. She presented to the ER today with a complaint of severe substernal chest pressure, off and on for the last 3 days. It is described as pressure/tight pain with radiation down her arms and back. No relief with SL NTG. Her pain currently is 7/10. Telemetry showed ST depressions. POC troponin in the ER is elevated at 0.19. She was admitted and started on IV NTG, given MSO4 with improvement. Blood sugar was 572. She was given insulin. Integrelin started. Left heart cath revealed an RCA that was occluded proximally, 95% distal stenosis of the SVG to Diag(see full report below). She underwent successful PCI and stenting of a high-grade diagonal branch SVG lesion. She was treated with ASA and plavix. She had a hypoglycemic episode which was treated and improved to 92. Hgb A1C was 10.2. A long discussion was had with the patient regarding the need for follow up with Dr. Evlyn [name] and the need for better diabetes mgt. The diabetes coordinator was consulted. Ranexa was started at  B[facility].  After discharge the family had discontinued a few of her medicines including Ranexa which apparently had caused her liver enzyme  abnormalities in the past. She was also markedly hypotensive and was seen in followup in Dr. Malena [name] office with blood pressures of 70/40 and having associated fatigue and lethargy. I recommended holding some of her medications and if her blood pressure did not improve that she may need readmission. She follows up today and reports marked improvement in her symptoms and her blood pressure has indeed improved.   [name] continues to have multiple medical problems. She reports recently that she had chest discomfort and emotional discomfort with the loss of her dog. She reported significant angina and was taking sublingual nitroglycerin and nitroglycerin several times a day with minimal improvement in her symptoms. In addition, she takes Imdur 60 mg twice daily. She was intolerant 2 Ranexa. She has known insufficient collaterals and unlikely targets for intervention. She is not a candidate for repeat cardiac surgery or intervention. She was placed on a Duragesic patch, which I think is helpful for her however she often forgets to change her patch and they may be a cause of her symptoms.  Unfortunately, she was recently admitted with a hip fracture. She had a subsequently repaired however she developed acute delirium post procedure and took up quite a while to recover. A repeat echocardiogram did show an improvement in her ejection fraction up to 55-60%. This is compared to her catheterization in 2014 which showed an EF of 35-40%. Unfortunately she suffered a non-STEMI during that hospitalization, however given her significant coronary disease with poor chart it's for intervention, cardiac  catheterization was not recommended. I recommended medical therapy.  Since discharge she reports she's been swelling and has had about 10 pound weight gain. She does report some increased appetite however I do not think it represents all of the weight gain.  Low blood pressures at times have been an issue as well. It seems that  she has a systolic differential between both arms of about 20 mmHg.  [name] returns here for followup. Her weight has been stable on her current regimen of diuretics.  PMHx:  Past Medical History  Diagnosis Date  . Respiratory arrest   . COPD (chronic obstructive pulmonary disease)   . CHF (congestive heart failure)   . Diabetes mellitus     insulin-dep  . Fibromyalgia   . Deaf     bilateral neurosensory hearing loss - Dr. Narda [name]  . Myocardial infarction   . Stroke   . GERD (gastroesophageal reflux disease)   . Depression   . Hypertension   . Pneumonia   . Coronary artery disease     a. h/o CABG  . Polymyalgia rheumatica   . Unstable angina   . PAD (peripheral artery disease)     LEAs 05/10/2012 - L SFA 50-69% diameter reduction, bilat ICA with 0-49% diameter reduction  . Tobacco abuse   . History of nuclear stress test 11/2011    lexiscan; low risk, non-diagnostic for ischemia    Past Surgical History  Procedure Laterality Date  . Coronary artery bypass graft  04/03/1993    x5 LIMA to LAD; SVG to DX1; SVG to RCA, SVG to OM;  (Dr. Tyrone [name])  . Eye surgery    . Coronary angioplasty  1995    RCA PTCA (07/23/1993); mid-LAD PTCA (08/21/1993); PTCA for restenosis RCA (09/13/1993),   . Coronary angioplasty  1998    Cfx & LAD PTCA  . Coronary angioplasty  1999    Cfx & LAD PTCA  . Coronary angioplasty  09/17/2004    cutting balloon small vessel mid LAD beyond LIMA insertion (2.5x36mm Cypher to mid SVG to RCA  . Coronary angioplasty  05/21/2010    patent LIMA-LAD, diffuse disease distally, patent SVG-small diagonal, patent SVG-right w/widely patent stent, patent SVG-OM1 (Dr. Kirtland Bouchard. [place] [name])  . Transthoracic echocardiogram  12/2010    EF=>55%, mild conc LVH; IVC non-dilated w/>50% collapse; mild mitral annular calcif, calcified mitral apparatus, mild MD; mild TR & elevated RVSP; mild-mod AV regurg  . Coronary angioplasty  07/18/2008    patent SVG to acute marginal & PDA, SVG to OM1,  SVG to diagonal 1, & LIMA to LAD (Dr. Evlyn [name](  . Coronary angioplasty  04/20/2007    patent SVG-diagonal, SVG-obtuse marginal, SVG-RCA, LIMA-LAD, patent stent to SVG in RCA (Dr. Claudia Desanctis)  . Cardiac catheterization  04/10/2007    3x60mm Promus DES to SVG to RCA  . Coronary angioplasty  03/22/2006    patent LIMA to LAD, patent SVG to small diagonal, patent SVG to OM, patent vein graft to RV acute marginal & RCA (Dr. Laurell Josephs)  . Coronary angioplasty  12/03/2002    ballon angioplasty to lesion in prox LAD, prox Cfx & stenting, angioplasty to L AV groove artery (Dr. Jonette Eva)  . Coronary angioplasty  09/08/2000    Dr. Jonette Eva  . Hip pinning,cannulated  09/19/2013  . Hip pinning,cannulated Right 09/19/2013    Procedure: CANNULATED HIP PINNING;  Surgeon: Sheral Apley, MD;  Location: [facility] OR;  Service: Orthopedics;  Laterality: Right;  Fracture table, c-arm, [employer]- Mr. Eulah Pont to call rep    FAMHx:  Family History  Problem Relation Age of Onset  . Hypertension Father     SOCHx:   reports that she quit smoking about 5 years ago. She does not have any smokeless tobacco history on file. She reports that she does not drink alcohol or use illicit drugs.  ALLERGIES:  Allergies  Allergen Reactions  . Codeine     Unknown  . Flagyl [Metronidazole]     Unknown  . Fluocinonide     Unknown  . Ranexa [Ranolazine] Other (See Comments)    Elevated liver function  . Statins Other (See Comments)    Muscle aches  . Biaxin [Clarithromycin] Rash  . Demerol [Meperidine] Rash    ROS: A comprehensive review of systems was negative except for: Constitutional: positive for fatigue Eyes: positive for vision loss, glaucoma Cardiovascular: positive for lower extremity edema  HOME MEDS: Current Outpatient Prescriptions  Medication Sig Dispense Refill  . ALPRAZolam (XANAX) 1 MG tablet Take 1 mg by mouth every 6 (six) hours as needed for anxiety.      Marland Kitchen amitriptyline (ELAVIL) 50 MG tablet Take  50 mg by mouth 2 (two) times daily.      Marland Kitchen aspirin EC 81 MG tablet Take 162 mg by mouth 2 (two) times daily.      . Calcium Carb-Cholecalciferol 600-500 MG-UNIT CAPS Take 1 capsule by mouth daily.      . carvedilol (COREG) 25 MG tablet Take 25 mg by mouth 2 (two) times daily with a meal.      . clopidogrel (PLAVIX) 75 MG tablet Take 1 tablet (75 mg total) by mouth daily.  30 tablet  11  . COD LIVER OIL PO Take 1 capsule by mouth daily.       . Cyanocobalamin (VITAMIN B-12 PO) Take 1 tablet by mouth daily.      Marland Kitchen docusate sodium (COLACE) 100 MG capsule Take 1 capsule (100 mg total) by mouth 2 (two) times daily.  60 capsule  0  . fentaNYL (DURAGESIC - DOSED MCG/HR) 25 MCG/HR patch Place 1 patch (25 mcg total) onto the skin every 3 (three) days.  5 patch  0  . folic acid (FOLVITE) 800 MCG tablet Take 1,600 mcg by mouth daily.      . furosemide (LASIX) 40 MG tablet Take 40-60 mg by mouth See admin instructions. Take 40 mg every  morning.  Take 20 mg every other evening.      . insulin glargine (LANTUS) 100 UNIT/ML injection Inject 0.28 mLs (28 Units total) into the skin daily.  10 mL  11  . insulin glargine (LANTUS) 100 UNIT/ML injection Inject 0.15 mLs (15 Units total) into the skin at bedtime.  10 mL  11  . iron polysaccharides (NU-IRON) 150 MG capsule Take 1 capsule (150 mg total) by mouth daily.      . isosorbide mononitrate (IMDUR) 60 MG 24 hr tablet Take 1 tablet (60 mg total) by mouth 2 (two) times daily.  60 tablet  10  . Lutein 20 MG CAPS Take 1 capsule by mouth daily.      . nitroGLYCERIN (NITROSTAT) 0.4 MG SL tablet Place 0.4 mg under the tongue every 5 (five) minutes as needed for chest pain.      Marland Kitchen omeprazole (PRILOSEC) 20 MG capsule Take 20 mg by mouth daily.      Marland Kitchen oxyCODONE-acetaminophen (PERCOCET) 10-325 MG per tablet Take 1 tablet by mouth  every 4 (four) hours as needed for pain.  30 tablet  0  . potassium chloride SA (K-DUR,KLOR-CON) 20 MEQ tablet Take 30 mEq by mouth daily.      .  predniSONE (DELTASONE) 5 MG tablet Take 1 tablet (5 mg total) by mouth daily with breakfast.      . Pyridoxine HCl (VITAMIN B-6 PO) Take 1 tablet by mouth daily.      . sertraline (ZOLOFT) 50 MG tablet Take 50 mg by mouth daily.      . timolol (TIMOPTIC) 0.25 % ophthalmic solution Place 1 drop into both eyes daily.       . TRAVATAN Z 0.004 % SOLN ophthalmic solution Place 1 drop into both eyes at bedtime.      . valsartan (DIOVAN) 40 MG tablet Take 40 mg by mouth daily.      . vitamin E 400 UNIT capsule Take 400 Units by mouth daily.      Marland Kitchen zinc gluconate 50 MG tablet Take 50 mg by mouth daily.      Marland Kitchen ZINC GLUCONATE PO Take by mouth.       No current facility-administered medications for this visit.    LABS/IMAGING: No results found for this or any previous visit (from the past 48 hour(s)). No results found.  VITALS: BP 119/58  Pulse 66  Ht  (1.626 m)  Wt 164 lb (74.39 kg)  BMI 28.14 kg/m2  EXAM: deferred  EKG: deferred  ASSESSMENT: 1. NSTEMI with recent PCI to SVG to RCA (3.0 x 15 Xience expedition DES) in 2013, no further percutaneous options 2. CABG in 1995 with a LIMA to LAD, saphenous vein graft to OM1, saphenous vein graft to a diagonal, and saphenous vein graft to the right coronary artery. 3. Poorly controlled diabetes on insulin with recent blood sugar of 574 in the hospital 4. Diabetic retinopathy, sensorineural hearing loss, and peripheral neuropathy 5. Dyslipidemia 6. PMR 7. Anxiety 8. 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 9. Recent hip fracture. 10. Ischemic cardiomyopathy, EF 35-40% (now improved to 55-60% in 2015) 11. Chronic diastolic congestive heart failure  PLAN: 1.   Mrs. Berghuis appears euvolemic at this point. Her weight is stable at 164 pounds. I believe this is her new dry weight. Her BNP was about 150. I would recommend continuing current dose of diuretics which is 40 mg in the morning and 40 at night alternating every other day with 20  mg at night. Plan to see her back in 3-4 months.  Chrystie Nose, MD, [facility] Attending Cardiologist [facility]  [name],Kenneth C 11/11/2013, 4:45 PM

## 2013-12-04 ENCOUNTER — Other Ambulatory Visit: Payer: Self-pay

## 2013-12-04 MED ORDER — CLOPIDOGREL BISULFATE 75 MG PO TABS: 75.0000 mg | ORAL_TABLET | Freq: Every day | ORAL | Status: DC

## 2013-12-04 NOTE — Telephone Encounter (Signed)
Rx sent to pharmacy   

## 2013-12-06 ENCOUNTER — Encounter (HOSPITAL_COMMUNITY): Payer: Self-pay | Admitting: Emergency Medicine

## 2013-12-06 ENCOUNTER — Emergency Department (HOSPITAL_COMMUNITY): Payer: Medicare Other

## 2013-12-06 ENCOUNTER — Inpatient Hospital Stay (HOSPITAL_COMMUNITY)
Admission: EM | Admit: 2013-12-06 | Discharge: 2013-12-09 | DRG: 871 | Disposition: A | Discharge status: Home / Self Care | Payer: Medicare Other | Attending: Internal Medicine | Admitting: Internal Medicine

## 2013-12-06 DIAGNOSIS — I25810 Atherosclerosis of coronary artery bypass graft(s) without angina pectoris: Secondary | ICD-10-CM

## 2013-12-06 DIAGNOSIS — I509 Heart failure, unspecified: Secondary | ICD-10-CM | POA: Diagnosis present

## 2013-12-06 DIAGNOSIS — Z8673 Personal history of transient ischemic attack (TIA), and cerebral infarction without residual deficits: Secondary | ICD-10-CM | POA: Diagnosis not present

## 2013-12-06 DIAGNOSIS — I255 Ischemic cardiomyopathy: Secondary | ICD-10-CM | POA: Diagnosis present

## 2013-12-06 DIAGNOSIS — I10 Essential (primary) hypertension: Secondary | ICD-10-CM | POA: Diagnosis present

## 2013-12-06 DIAGNOSIS — M353 Polymyalgia rheumatica: Secondary | ICD-10-CM | POA: Diagnosis present

## 2013-12-06 DIAGNOSIS — I200 Unstable angina: Secondary | ICD-10-CM | POA: Diagnosis present

## 2013-12-06 DIAGNOSIS — N390 Urinary tract infection, site not specified: Secondary | ICD-10-CM | POA: Diagnosis present

## 2013-12-06 DIAGNOSIS — Z9861 Coronary angioplasty status: Secondary | ICD-10-CM | POA: Diagnosis not present

## 2013-12-06 DIAGNOSIS — E872 Acidosis, unspecified: Secondary | ICD-10-CM | POA: Diagnosis present

## 2013-12-06 DIAGNOSIS — I739 Peripheral vascular disease, unspecified: Secondary | ICD-10-CM | POA: Diagnosis present

## 2013-12-06 DIAGNOSIS — Z87891 Personal history of nicotine dependence: Secondary | ICD-10-CM | POA: Diagnosis not present

## 2013-12-06 DIAGNOSIS — E1143 Type 2 diabetes mellitus with diabetic autonomic (poly)neuropathy: Secondary | ICD-10-CM | POA: Diagnosis present

## 2013-12-06 DIAGNOSIS — G9341 Metabolic encephalopathy: Secondary | ICD-10-CM | POA: Diagnosis present

## 2013-12-06 DIAGNOSIS — J9819 Other pulmonary collapse: Secondary | ICD-10-CM | POA: Diagnosis present

## 2013-12-06 DIAGNOSIS — F3289 Other specified depressive episodes: Secondary | ICD-10-CM | POA: Diagnosis present

## 2013-12-06 DIAGNOSIS — I252 Old myocardial infarction: Secondary | ICD-10-CM

## 2013-12-06 DIAGNOSIS — F411 Generalized anxiety disorder: Secondary | ICD-10-CM | POA: Diagnosis present

## 2013-12-06 DIAGNOSIS — F29 Unspecified psychosis not due to a substance or known physiological condition: Secondary | ICD-10-CM | POA: Diagnosis not present

## 2013-12-06 DIAGNOSIS — IMO0002 Reserved for concepts with insufficient information to code with codable children: Secondary | ICD-10-CM | POA: Diagnosis present

## 2013-12-06 DIAGNOSIS — J449 Chronic obstructive pulmonary disease, unspecified: Secondary | ICD-10-CM | POA: Diagnosis present

## 2013-12-06 DIAGNOSIS — E876 Hypokalemia: Secondary | ICD-10-CM | POA: Diagnosis present

## 2013-12-06 DIAGNOSIS — J4489 Other specified chronic obstructive pulmonary disease: Secondary | ICD-10-CM | POA: Diagnosis present

## 2013-12-06 DIAGNOSIS — S72009D Fracture of unspecified part of neck of unspecified femur, subsequent encounter for closed fracture with routine healing: Secondary | ICD-10-CM

## 2013-12-06 DIAGNOSIS — G909 Disorder of the autonomic nervous system, unspecified: Secondary | ICD-10-CM | POA: Diagnosis present

## 2013-12-06 DIAGNOSIS — F419 Anxiety disorder, unspecified: Secondary | ICD-10-CM | POA: Diagnosis present

## 2013-12-06 DIAGNOSIS — R6520 Severe sepsis without septic shock: Secondary | ICD-10-CM

## 2013-12-06 DIAGNOSIS — I2589 Other forms of chronic ischemic heart disease: Secondary | ICD-10-CM | POA: Diagnosis present

## 2013-12-06 DIAGNOSIS — E1049 Type 1 diabetes mellitus with other diabetic neurological complication: Secondary | ICD-10-CM | POA: Diagnosis present

## 2013-12-06 DIAGNOSIS — A419 Sepsis, unspecified organism: Secondary | ICD-10-CM | POA: Diagnosis not present

## 2013-12-06 DIAGNOSIS — K219 Gastro-esophageal reflux disease without esophagitis: Secondary | ICD-10-CM | POA: Diagnosis present

## 2013-12-06 DIAGNOSIS — F329 Major depressive disorder, single episode, unspecified: Secondary | ICD-10-CM | POA: Diagnosis present

## 2013-12-06 DIAGNOSIS — Z794 Long term (current) use of insulin: Secondary | ICD-10-CM | POA: Diagnosis not present

## 2013-12-06 DIAGNOSIS — S72009A Fracture of unspecified part of neck of unspecified femur, initial encounter for closed fracture: Secondary | ICD-10-CM | POA: Diagnosis present

## 2013-12-06 DIAGNOSIS — R002 Palpitations: Secondary | ICD-10-CM | POA: Diagnosis present

## 2013-12-06 DIAGNOSIS — I2510 Atherosclerotic heart disease of native coronary artery without angina pectoris: Secondary | ICD-10-CM | POA: Diagnosis present

## 2013-12-06 DIAGNOSIS — Z951 Presence of aortocoronary bypass graft: Secondary | ICD-10-CM

## 2013-12-06 DIAGNOSIS — E1069 Type 1 diabetes mellitus with other specified complication: Secondary | ICD-10-CM

## 2013-12-06 DIAGNOSIS — E1065 Type 1 diabetes mellitus with hyperglycemia: Secondary | ICD-10-CM | POA: Diagnosis present

## 2013-12-06 LAB — HEPATIC FUNCTION PANEL
ALBUMIN: 3.8 g/dL (ref 3.5–5.2)
ALT: 17 U/L (ref 0–35)
AST: 21 U/L (ref 0–37)
Alkaline Phosphatase: 100 U/L (ref 39–117)
Bilirubin, Direct: <0.2 mg/dL (ref 0.0–0.3)
TOTAL PROTEIN: 7.8 g/dL (ref 6.0–8.3)
Total Bilirubin: 0.3 mg/dL (ref 0.3–1.2)

## 2013-12-06 LAB — BASIC METABOLIC PANEL
ANION GAP: 15 (ref 5–15)
BUN: 25 mg/dL — ABNORMAL HIGH (ref 6–23)
CO2: 28 mEq/L (ref 19–32)
Calcium: 9.3 mg/dL (ref 8.4–10.5)
Chloride: 96 mEq/L (ref 96–112)
Creatinine, Ser: 1 mg/dL (ref 0.50–1.10)
GFR calc Af Amer: 68 mL/min — ABNORMAL LOW (ref >90)
GFR, EST NON AFRICAN AMERICAN: 59 mL/min — AB (ref >90)
GLUCOSE: 114 mg/dL — AB (ref 70–99)
POTASSIUM: 3.6 meq/L — AB (ref 3.7–5.3)
SODIUM: 139 meq/L (ref 137–147)

## 2013-12-06 LAB — URINALYSIS, ROUTINE W REFLEX MICROSCOPIC
Bilirubin Urine: NEGATIVE
Glucose, UA: NEGATIVE mg/dL
KETONES UR: NEGATIVE mg/dL
NITRITE: NEGATIVE
PROTEIN: 30 mg/dL — AB
Specific Gravity, Urine: 1.012 (ref 1.005–1.030)
UROBILINOGEN UA: 0.2 mg/dL (ref 0.0–1.0)
pH: 6 (ref 5.0–8.0)

## 2013-12-06 LAB — CBC WITH DIFFERENTIAL/PLATELET
Basophils Absolute: 0 10*3/uL (ref 0.0–0.1)
Basophils Relative: 0 % (ref 0–1)
EOS ABS: 0.1 10*3/uL (ref 0.0–0.7)
Eosinophils Relative: 1 % (ref 0–5)
HCT: 36.1 % (ref 36.0–46.0)
Hemoglobin: 12.1 g/dL (ref 12.0–15.0)
LYMPHS PCT: 18 % (ref 12–46)
Lymphs Abs: 2.4 10*3/uL (ref 0.7–4.0)
MCH: 29.4 pg (ref 26.0–34.0)
MCHC: 33.5 g/dL (ref 30.0–36.0)
MCV: 87.6 fL (ref 78.0–100.0)
Monocytes Absolute: 1.2 10*3/uL — ABNORMAL HIGH (ref 0.1–1.0)
Monocytes Relative: 9 % (ref 3–12)
NEUTROS PCT: 72 % (ref 43–77)
Neutro Abs: 10.1 10*3/uL — ABNORMAL HIGH (ref 1.7–7.7)
Platelets: 280 10*3/uL (ref 150–400)
RBC: 4.12 MIL/uL (ref 3.87–5.11)
RDW: 12.9 % (ref 11.5–15.5)
WBC: 13.9 10*3/uL — AB (ref 4.0–10.5)

## 2013-12-06 LAB — GLUCOSE, CAPILLARY: Glucose-Capillary: 168 mg/dL — ABNORMAL HIGH (ref 70–99)

## 2013-12-06 LAB — URINE MICROSCOPIC-ADD ON

## 2013-12-06 LAB — I-STAT CG4 LACTIC ACID, ED: Lactic Acid, Venous: 2.21 mmol/L — ABNORMAL HIGH (ref 0.5–2.2)

## 2013-12-06 LAB — TROPONIN I

## 2013-12-06 MED ORDER — IBUPROFEN 200 MG PO TABS: 400.0000 mg | ORAL_TABLET | Freq: Once | ORAL | Status: DC

## 2013-12-06 MED ORDER — DOCUSATE SODIUM 100 MG PO CAPS
100.0000 mg | ORAL_CAPSULE | Freq: Two times a day (BID) | ORAL | Status: DC
  Administered 2013-12-07: 100 mg via ORAL
  Filled 2013-12-06: qty 1

## 2013-12-06 MED ORDER — FENTANYL 25 MCG/HR TD PT72
25.0000 ug | MEDICATED_PATCH | TRANSDERMAL | Status: DC
  Administered 2013-12-07: 25 ug via TRANSDERMAL
  Filled 2013-12-06: qty 1

## 2013-12-06 MED ORDER — INSULIN ASPART 100 UNIT/ML ~~LOC~~ SOLN
0.0000 [IU] | Freq: Every day | SUBCUTANEOUS | Status: DC
  Administered 2013-12-07: 2 [IU] via SUBCUTANEOUS

## 2013-12-06 MED ORDER — ALPRAZOLAM 1 MG PO TABS
1.0000 mg | ORAL_TABLET | Freq: Four times a day (QID) | ORAL | Status: DC | PRN
  Administered 2013-12-07 – 2013-12-09 (×5): 1 mg via ORAL
  Filled 2013-12-06 (×5): qty 1

## 2013-12-06 MED ORDER — MORPHINE SULFATE 2 MG/ML IJ SOLN: 2.0000 mg | INTRAMUSCULAR | Status: DC | PRN

## 2013-12-06 MED ORDER — AMITRIPTYLINE HCL 50 MG PO TABS
50.0000 mg | ORAL_TABLET | Freq: Two times a day (BID) | ORAL | Status: DC
  Administered 2013-12-07 – 2013-12-09 (×6): 50 mg via ORAL
  Filled 2013-12-06: qty 2
  Filled 2013-12-06 (×2): qty 1
  Filled 2013-12-06: qty 2
  Filled 2013-12-06 (×2): qty 1

## 2013-12-06 MED ORDER — ACETAMINOPHEN 325 MG PO TABS
650.0000 mg | ORAL_TABLET | Freq: Once | ORAL | Status: AC
  Administered 2013-12-06: 650 mg via ORAL
  Filled 2013-12-06: qty 2

## 2013-12-06 MED ORDER — ZINC SULFATE 220 (50 ZN) MG PO CAPS
220.0000 mg | ORAL_CAPSULE | Freq: Every day | ORAL | Status: DC
  Administered 2013-12-07 – 2013-12-09 (×3): 220 mg via ORAL
  Filled 2013-12-06 (×3): qty 1

## 2013-12-06 MED ORDER — DEXTROSE 5 % IV SOLN
1.0000 g | Freq: Two times a day (BID) | INTRAVENOUS | Status: DC
  Administered 2013-12-06: 1 g via INTRAVENOUS
  Filled 2013-12-06: qty 1

## 2013-12-06 MED ORDER — INSULIN GLARGINE 100 UNIT/ML ~~LOC~~ SOLN
28.0000 [IU] | Freq: Every day | SUBCUTANEOUS | Status: DC
  Administered 2013-12-07 – 2013-12-09 (×3): 28 [IU] via SUBCUTANEOUS
  Filled 2013-12-06 (×3): qty 0.28

## 2013-12-06 MED ORDER — TIMOLOL MALEATE 0.25 % OP SOLN
1.0000 [drp] | Freq: Every day | OPHTHALMIC | Status: DC
  Administered 2013-12-07 – 2013-12-09 (×3): 1 [drp] via OPHTHALMIC
  Filled 2013-12-06: qty 5

## 2013-12-06 MED ORDER — PANTOPRAZOLE SODIUM 40 MG PO TBEC
40.0000 mg | DELAYED_RELEASE_TABLET | Freq: Every day | ORAL | Status: DC
  Administered 2013-12-07 – 2013-12-09 (×3): 40 mg via ORAL
  Filled 2013-12-06 (×3): qty 1

## 2013-12-06 MED ORDER — VITAMIN B-12 100 MCG PO TABS
100.0000 ug | ORAL_TABLET | Freq: Every day | ORAL | Status: DC
  Administered 2013-12-07 – 2013-12-09 (×3): 100 ug via ORAL
  Filled 2013-12-06 (×3): qty 1

## 2013-12-06 MED ORDER — ONDANSETRON HCL 4 MG/2ML IJ SOLN
4.0000 mg | Freq: Once | INTRAMUSCULAR | Status: AC
  Administered 2013-12-06: 4 mg via INTRAVENOUS
  Filled 2013-12-06: qty 2

## 2013-12-06 MED ORDER — FUROSEMIDE 40 MG PO TABS: 40.0000 mg | ORAL_TABLET | ORAL | Status: DC

## 2013-12-06 MED ORDER — PROSIGHT PO TABS
1.0000 | ORAL_TABLET | Freq: Every day | ORAL | Status: DC
  Administered 2013-12-07 – 2013-12-09 (×3): 1 via ORAL
  Filled 2013-12-06 (×3): qty 1

## 2013-12-06 MED ORDER — FOLIC ACID 1 MG PO TABS
1.5000 mg | ORAL_TABLET | Freq: Every day | ORAL | Status: DC
  Administered 2013-12-07 – 2013-12-09 (×3): 1.5 mg via ORAL
  Filled 2013-12-06 (×2): qty 1.5
  Filled 2013-12-06: qty 2

## 2013-12-06 MED ORDER — OXYCODONE-ACETAMINOPHEN 10-325 MG PO TABS: 1.0000 | ORAL_TABLET | ORAL | Status: DC | PRN

## 2013-12-06 MED ORDER — ASPIRIN EC 81 MG PO TBEC
162.0000 mg | DELAYED_RELEASE_TABLET | Freq: Two times a day (BID) | ORAL | Status: DC
  Administered 2013-12-07 – 2013-12-09 (×6): 162 mg via ORAL
  Filled 2013-12-06 (×6): qty 2

## 2013-12-06 MED ORDER — LUTEIN 20 MG PO CAPS: 1.0000 | ORAL_CAPSULE | Freq: Every day | ORAL | Status: DC

## 2013-12-06 MED ORDER — ACETAMINOPHEN 650 MG RE SUPP: 650.0000 mg | Freq: Four times a day (QID) | RECTAL | Status: DC | PRN

## 2013-12-06 MED ORDER — PIPERACILLIN-TAZOBACTAM 3.375 G IVPB
3.3750 g | Freq: Three times a day (TID) | INTRAVENOUS | Status: DC
  Administered 2013-12-07 – 2013-12-09 (×7): 3.375 g via INTRAVENOUS
  Filled 2013-12-06 (×9): qty 50

## 2013-12-06 MED ORDER — ONDANSETRON HCL 4 MG PO TABS: 4.0000 mg | ORAL_TABLET | Freq: Four times a day (QID) | ORAL | Status: DC | PRN

## 2013-12-06 MED ORDER — POLYSACCHARIDE IRON COMPLEX 150 MG PO CAPS
150.0000 mg | ORAL_CAPSULE | Freq: Every day | ORAL | Status: DC
  Administered 2013-12-07 – 2013-12-09 (×3): 150 mg via ORAL
  Filled 2013-12-06 (×3): qty 1

## 2013-12-06 MED ORDER — ISOSORBIDE MONONITRATE ER 60 MG PO TB24
60.0000 mg | ORAL_TABLET | Freq: Two times a day (BID) | ORAL | Status: DC
  Administered 2013-12-07 – 2013-12-09 (×6): 60 mg via ORAL
  Filled 2013-12-06 (×6): qty 1

## 2013-12-06 MED ORDER — PREDNISONE 2.5 MG PO TABS
2.5000 mg | ORAL_TABLET | Freq: Every day | ORAL | Status: DC
  Administered 2013-12-07 – 2013-12-09 (×3): 2.5 mg via ORAL
  Filled 2013-12-06 (×3): qty 1

## 2013-12-06 MED ORDER — CALCIUM CARB-CHOLECALCIFEROL 600-500 MG-UNIT PO CAPS: 1.0000 | ORAL_CAPSULE | Freq: Every day | ORAL | Status: DC

## 2013-12-06 MED ORDER — IRBESARTAN 75 MG PO TABS
75.0000 mg | ORAL_TABLET | Freq: Every day | ORAL | Status: DC
Start: 2013-12-07 — End: 2013-12-09
  Administered 2013-12-07 – 2013-12-09 (×3): 75 mg via ORAL
  Filled 2013-12-06 (×3): qty 1

## 2013-12-06 MED ORDER — MORPHINE SULFATE 4 MG/ML IJ SOLN
4.0000 mg | INTRAMUSCULAR | Status: DC | PRN
  Administered 2013-12-06: 4 mg via INTRAVENOUS
  Filled 2013-12-06 (×2): qty 1

## 2013-12-06 MED ORDER — SODIUM CHLORIDE 0.9 % IV BOLUS (SEPSIS)
1000.0000 mL | Freq: Once | INTRAVENOUS | Status: AC
  Administered 2013-12-06: 1000 mL via INTRAVENOUS

## 2013-12-06 MED ORDER — SERTRALINE HCL 50 MG PO TABS
50.0000 mg | ORAL_TABLET | Freq: Every day | ORAL | Status: DC
  Administered 2013-12-07 – 2013-12-09 (×4): 50 mg via ORAL
  Filled 2013-12-06 (×4): qty 1

## 2013-12-06 MED ORDER — INSULIN ASPART 100 UNIT/ML ~~LOC~~ SOLN
0.0000 [IU] | Freq: Three times a day (TID) | SUBCUTANEOUS | Status: DC
  Administered 2013-12-07: 11 [IU] via SUBCUTANEOUS
  Administered 2013-12-07 (×2): 4 [IU] via SUBCUTANEOUS
  Administered 2013-12-08: 11 [IU] via SUBCUTANEOUS
  Administered 2013-12-08: 4 [IU] via SUBCUTANEOUS

## 2013-12-06 MED ORDER — CLOPIDOGREL BISULFATE 75 MG PO TABS
75.0000 mg | ORAL_TABLET | Freq: Every day | ORAL | Status: DC
  Administered 2013-12-07 – 2013-12-09 (×4): 75 mg via ORAL
  Filled 2013-12-06 (×4): qty 1

## 2013-12-06 MED ORDER — PYRIDOXINE HCL 25 MG PO TABS
25.0000 mg | ORAL_TABLET | Freq: Every day | ORAL | Status: DC
Start: 2013-12-06 — End: 2013-12-09
  Administered 2013-12-07 – 2013-12-09 (×4): 25 mg via ORAL
  Filled 2013-12-06 (×4): qty 1

## 2013-12-06 MED ORDER — VANCOMYCIN HCL 10 G IV SOLR
1500.0000 mg | Freq: Once | INTRAVENOUS | Status: AC
  Administered 2013-12-06: 1500 mg via INTRAVENOUS
  Filled 2013-12-06: qty 1500

## 2013-12-06 MED ORDER — OXYCODONE HCL 5 MG PO TABS
5.0000 mg | ORAL_TABLET | ORAL | Status: DC | PRN
  Administered 2013-12-07: 5 mg via ORAL
  Filled 2013-12-06: qty 1

## 2013-12-06 MED ORDER — POTASSIUM CHLORIDE CRYS ER 20 MEQ PO TBCR
30.0000 meq | EXTENDED_RELEASE_TABLET | Freq: Every day | ORAL | Status: DC
  Administered 2013-12-07 – 2013-12-09 (×4): 30 meq via ORAL
  Filled 2013-12-06 (×8): qty 1

## 2013-12-06 MED ORDER — ACETAMINOPHEN 325 MG PO TABS: 650.0000 mg | ORAL_TABLET | Freq: Four times a day (QID) | ORAL | Status: DC | PRN

## 2013-12-06 MED ORDER — INSULIN GLARGINE 100 UNIT/ML ~~LOC~~ SOLN
15.0000 [IU] | Freq: Every day | SUBCUTANEOUS | Status: DC
  Administered 2013-12-07: 15 [IU] via SUBCUTANEOUS
  Filled 2013-12-06 (×4): qty 0.15

## 2013-12-06 MED ORDER — VANCOMYCIN HCL IN DEXTROSE 750-5 MG/150ML-% IV SOLN
750.0000 mg | Freq: Two times a day (BID) | INTRAVENOUS | Status: DC
  Administered 2013-12-07 – 2013-12-08 (×3): 750 mg via INTRAVENOUS
  Filled 2013-12-06 (×5): qty 150

## 2013-12-06 MED ORDER — CARVEDILOL 12.5 MG PO TABS
25.0000 mg | ORAL_TABLET | Freq: Two times a day (BID) | ORAL | Status: DC
  Administered 2013-12-07 – 2013-12-09 (×5): 25 mg via ORAL
  Filled 2013-12-06 (×5): qty 2

## 2013-12-06 MED ORDER — POLYETHYLENE GLYCOL 3350 17 G PO PACK
17.0000 g | PACK | Freq: Two times a day (BID) | ORAL | Status: DC
  Administered 2013-12-07 – 2013-12-09 (×6): 17 g via ORAL
  Filled 2013-12-06 (×6): qty 1

## 2013-12-06 MED ORDER — ZINC GLUCONATE 50 MG PO TABS: 50.0000 mg | ORAL_TABLET | Freq: Every day | ORAL | Status: DC

## 2013-12-06 MED ORDER — VITAMIN E 180 MG (400 UNIT) PO CAPS
400.0000 [IU] | ORAL_CAPSULE | Freq: Every day | ORAL | Status: DC
Start: 2013-12-07 — End: 2013-12-09
  Administered 2013-12-07 – 2013-12-09 (×3): 400 [IU] via ORAL
  Filled 2013-12-06 (×3): qty 1

## 2013-12-06 MED ORDER — OXYCODONE-ACETAMINOPHEN 5-325 MG PO TABS: 1.0000 | ORAL_TABLET | ORAL | Status: DC | PRN

## 2013-12-06 MED ORDER — CALCIUM CARBONATE-VITAMIN D 500-200 MG-UNIT PO TABS
1.0000 | ORAL_TABLET | Freq: Every day | ORAL | Status: DC
  Administered 2013-12-07 – 2013-12-09 (×3): 1 via ORAL
  Filled 2013-12-06 (×3): qty 1

## 2013-12-06 MED ORDER — ONDANSETRON HCL 4 MG/2ML IJ SOLN: 4.0000 mg | Freq: Four times a day (QID) | INTRAMUSCULAR | Status: DC | PRN

## 2013-12-06 MED ORDER — SODIUM CHLORIDE 0.9 % IV SOLN
INTRAVENOUS | Status: DC
  Administered 2013-12-06: 75 mL/h via INTRAVENOUS

## 2013-12-06 MED ORDER — SODIUM CHLORIDE 0.9 % IV BOLUS (SEPSIS)
500.0000 mL | Freq: Once | INTRAVENOUS | Status: AC
  Administered 2013-12-06: 500 mL via INTRAVENOUS

## 2013-12-06 MED ORDER — MORPHINE SULFATE 4 MG/ML IJ SOLN: 4.0000 mg | INTRAMUSCULAR | Status: DC | PRN

## 2013-12-06 MED ORDER — FUROSEMIDE 20 MG PO TABS
20.0000 mg | ORAL_TABLET | ORAL | Status: DC
  Administered 2013-12-07: 20 mg via ORAL
  Filled 2013-12-06: qty 1

## 2013-12-06 MED ORDER — LATANOPROST 0.005 % OP SOLN
1.0000 [drp] | Freq: Every day | OPHTHALMIC | Status: DC
  Administered 2013-12-07 – 2013-12-08 (×3): 1 [drp] via OPHTHALMIC
  Filled 2013-12-06: qty 2.5

## 2013-12-06 MED ORDER — SODIUM CHLORIDE 0.9 % IJ SOLN
3.0000 mL | Freq: Two times a day (BID) | INTRAMUSCULAR | Status: DC
  Administered 2013-12-07: 3 mL via INTRAVENOUS

## 2013-12-06 MED ORDER — FUROSEMIDE 40 MG PO TABS
40.0000 mg | ORAL_TABLET | Freq: Every day | ORAL | Status: DC
  Administered 2013-12-07 – 2013-12-09 (×3): 40 mg via ORAL
  Filled 2013-12-06 (×3): qty 1

## 2013-12-06 NOTE — ED Notes (Signed)
Patient transported to CT 

## 2013-12-06 NOTE — ED Notes (Signed)
Bed: WA05 Expected date:  Expected time:  Means of arrival:  Comments: EMS N/V 

## 2013-12-06 NOTE — ED Provider Notes (Signed)
Medical screening examination/treatment/procedure(s) were conducted as a shared visit with non-physician practitioner(s) and myself.  I personally evaluated the patient during the encounter.   EKG Interpretation None       Angiocath insertion Performed by: Raeford Razor  Consent: Verbal consent obtained. Risks and benefits: risks, benefits and alternatives were discussed Time out: Immediately prior to procedure a "time out" was called to verify the correct patient, procedure, equipment, support staff and site/side marked as required.  Preparation: Patient was prepped and draped in the usual sterile fashion.  Vein Location: L AC  Ultrasound Guided  Gauge: 20 g  Normal blood return and flush without difficulty Patient tolerance: Patient tolerated the procedure well with no immediate complications.     Raeford Razor, MD 12/12/13 [phone]

## 2013-12-06 NOTE — ED Notes (Signed)
Per EMS- Family called and stated patient was hypoglycemic. CBG on arrival -206. Patient had a temporal temp 101.3. Patient has a history of UTI's. Patient was given NS 500 ml and zofran 4 mg IV prior to arrival to the ED. EKG-unremarkable.

## 2013-12-06 NOTE — ED Notes (Signed)
IV Team in room attempting to start IV.

## 2013-12-06 NOTE — ED Notes (Addendum)
Pt on bed pan continuously falling asleep, and stating it is hard to go on bed pan.  She laughed when I mentioned use of catheter statin she would not have one

## 2013-12-06 NOTE — ED Notes (Addendum)
Patient and patient's visitors concerned about patient not receiving Plavix today. Explained to the patient and visitors that the hospitalist admitting her would take care of medications that needed to be ordered. Visitors were very dissatisfied about the explanation.

## 2013-12-06 NOTE — ED Notes (Signed)
Dr. Juleen China notified that IV team was unable to get an IV with the ultrasound.

## 2013-12-06 NOTE — Progress Notes (Signed)
ANTIBIOTIC CONSULT NOTE - INITIAL  Pharmacy Consult for Vancomycin & Zosyn Indication: Sepsis  Allergies  Allergen Reactions  . Codeine     Unknown  . Flagyl [Metronidazole]     Unknown  . Fluocinonide     Unknown  . Ranexa [Ranolazine] Other (See Comments)    Elevated liver function  . Statins Other (See Comments)    Muscle aches  . Biaxin [Clarithromycin] Rash  . Demerol [Meperidine] Rash    Patient Measurements: Height:  (162.6 cm) Weight: 164 lb 0.4 oz (74.4 kg) IBW/kg (Calculated) : 54.7  Vital Signs: Temp: 100 F (37.8 C) (09/25 1209) Temp src: Oral (09/25 1209) BP: 129/46 mmHg (09/25 2100) Pulse Rate: 74 (09/25 2054) Intake/Output from previous day:   Intake/Output from this shift: Total I/O In: 1600 [I.V.:1600] Out: -   Labs:  Recent Labs  12/06/13 1320  WBC 13.9*  HGB 12.1  PLT 280  CREATININE 1.00   Estimated Creatinine Clearance: 57.6 ml/min (by C-G formula based on Cr of 1). No results found for this basename: VANCOTROUGH, VANCOPEAK, VANCORANDOM, GENTTROUGH, GENTPEAK, GENTRANDOM, TOBRATROUGH, TOBRAPEAK, TOBRARND, AMIKACINPEAK, AMIKACINTROU, AMIKACIN,  in the last 72 hours   Microbiology: No results found for this or any previous visit (from the past 720 hour(s)).  Medical History: Past Medical History  Diagnosis Date  . Respiratory arrest   . COPD (chronic obstructive pulmonary disease)   . CHF (congestive heart failure)   . Diabetes mellitus     insulin-dep  . Fibromyalgia   . Deaf     bilateral neurosensory hearing loss - Dr. Narda Bonds  . Myocardial infarction   . Stroke   . GERD (gastroesophageal reflux disease)   . Depression   . Hypertension   . Pneumonia   . Coronary artery disease     a. h/o CABG  . Polymyalgia rheumatica   . Unstable angina   . PAD (peripheral artery disease)     LEAs 05/10/2012 - L SFA 50-69% diameter reduction, bilat ICA with 0-49% diameter reduction  . Tobacco abuse   . History of nuclear  stress test 11/2011    lexiscan; low risk, non-diagnostic for ischemia    Medications:  Scheduled:   Infusions:  . ceFEPime (MAXIPIME) IV Stopped (12/06/13 2055)   Assessment:  63 yr female with h/o UTI.  Presents with fever, nausea and vomiting  Cefepime 1gm given in ED @ 19:33 and Vancomycin  given in ED @ 20:14  Upon admission, pharmacy requested to dose Vancomycin and Zosyn for Sepsis  CrCl ~ 57 ml/min  Blood and urine cultures ordered  Goal of Therapy:  Vancomycin trough level 15-20 mcg/ml  Plan:  Measure antibiotic drug levels at steady state Follow up culture results Zosyn 3.375gm IV q8h (each dose infused over 4 hrs) Vancomycin  IV q12h  Kelbie Moro, Joselyn Glassman, PharmD 12/06/2013,10:29 PM

## 2013-12-06 NOTE — H&P (Addendum)
[facility] History and Physical  [name] WCH:[id number] DOB: 1950/08/17 DOA: 12/06/2013  Referring physician: ER physician PCP: Alonza Bogus, MD   Chief Complaint: confusion, lethargy   HPI:  64 -year-old female with multiple medical comorbidities including CAD and quite extensive coronary angiographies (most recently seen in Dr. Lysbeth Penner office who said she is not a candidate for repeat cardiac surgery or intervention and at that time she was placed on Duragesic patch), recent hip surgery for hip fracture, has developed delirium after the procedure, has suffered a non-STEMI during that hospitalization but because of her significant coronary disease cardiac cath was not recommended (hospitalization in 09/2013), history of polymyalgia rheumatic on low dose prednisone, anxiety and depression, diabetes, dyslipidemia who presented to [facility] ED 12/06/2013 with altered mental status on the morning prior to this admission per family. Patient's family is not at the bedside to provide the details of the history but patient is back at her baseline mental status, oriented to time, place and person. Per the earlier report in ED apparently patient was found to be hypoglycemic at home with CBGs in 40s and with orange juice her mental status has improved. No complaints of chest pain or shortness of breath or palpitations. Patient complained of epigastric abdominal pain in ED but her pain is now resolved. Pt had some nausea and vomiting but now feels better. No lightheadedness or loss of consciousness. No reports of blood in the stool or urine. In ED, blood pressure was 85/30, heart rate 72, T max 100 F and oxygen saturation 89 - 100%. On 2 L nasal cannula oxygen support. Blood work revealed leukocytosis of 13.9 and potassium of 3.6. Urinalysis showed large leukocytes and too numerous to count white blood cells. Lactic acid was 2.21. The troponin level was within normal limits. CT abdomen showed moderate amount  of retained large bowel stool without bowel obstruction or acute intra-abdominal process. Also seen was a density within the left upper pole of the kidney consistent with hemorrhagic/proteinaceous cysts. I spoke with GU as well as cardiology to clarify if patient can take aspirin and Plavix because apparently earlier today in ED patient was not given dose of medications and she was adamant about having them since she did not take them this am. Per GU and cardio pt ok to take aspirin and plavix. Further imaging study included chest x-ray which showed left basilar atelectasis. Patient was started on broad-spectrum antibiotics vancomycin and Zosyn for treatment of sepsis likely secondary to urinary tract infection. She was admitted to step down unit because of hypotension.  Assessment & Plan    Principal Problem:   Sepsis secondary to UTI  Sepsis criteria met with initial vital signs that included hypertension, low-grade fever in addition to leukocytosis and lactic acidosis patient had evidence of infection based on urinalysis which showed large leukocytes and too numerous to count white blood cells.  IV fluids started for the first 12 hours. Please note that even with extensive coronary artery disease patient's most recent 2-D echo in July 2015 showed ejection fraction of 55-60%.  Continue broad-spectrum antibiotics, vancomycin and Zosyn. Follow blood culture and urine culture results.  Please note blood pressure at this time has improved, 129/46 so we will resume blood pressure medications from home. Active Problems:   Acute encephalopathy  Likely secondary to urinary tract infection. Mental status is back to baseline at this time very the patient is oriented to time, place and person.   Diabetes mellitus type 1, uncontrolled, insulin  dependent with complications of neuropathy  Check A1c on this admission. A1c in July 2014 10.4 indicating poor glycemic control.  Resumed insulin regimen from home  which includes Lantus 20 units in the morning and 15 units at bedtime in addition to sliding scale Humalog.   Polymyalgia rheumatica  On low dose prednisone   HTN (hypertension)  Continue to monitor on telemetry. Admission is to step down for first 24 hour observation.  May resume home medications for blood pressure control which includes Coreg, Lasix, Imdur, Diovan    CAD with multiple PCIs / Cardiomyopathy, ischemic /  History of recent NSTEMI with recent PCI to SVG to RCA (3.0 x 15 Xience expedition DES) in 2013, no further percutaneous options / Chronic diastolic CHF  Stable, no complaints of chest pain.  Resume aspirin and Plavix.  Resume Coreg, Lasix, Imdur, Diovan   Hip fracture- surgery 09/19/13  Resume home analgesics. Started morphine 2 mg IV every 4 hours when necessary for severe pain.   Anxiety and depression  Continue Xanax and Elavil, Zoloft per home regimen  DVT prophylaxis:   Aspirin and plavix   Radiological Exams on Admission:  Ct Abdomen Pelvis Wo Contrast 12/06/2013  Moderate amount of retained large bowel stool without bowel obstruction or acute intra-abdominal/pelvic process.  Density within upper pole LEFT kidney most consistent with hemorrhagic/ proteinaceous cyst.     Dg Chest 2 View 12/06/2013  Left basilar atelectasis.    Code Status: Full Family Communication: Plan of care discussed with the patient  Disposition Plan: Admit for further evaluation  Leisa Lenz, MD  [facility] Pager [phone]  Review of Systems:  Constitutional: Negative for fever, chills and malaise/fatigue. Negative for diaphoresis.  HENT: Negative for hearing loss, ear pain, nosebleeds, congestion, sore throat, neck pain, tinnitus and ear discharge.   Eyes: Negative for blurred vision, double vision, photophobia, pain, discharge and redness.  Respiratory: Negative for cough, hemoptysis, sputum production, shortness of breath, wheezing and stridor.   Cardiovascular:  Negative for chest pain, palpitations, orthopnea, claudication and leg swelling.  Gastrointestinal: positive for nausea, vomiting. Negative for heartburn, constipation, blood in stool and melena.  Genitourinary: Negative for dysuria, urgency, frequency, hematuria and flank pain.  Musculoskeletal: Negative for myalgias, back pain, joint pain and falls.  Skin: Negative for itching and rash.  Neurological: per HPI Endo/Heme/Allergies: Negative for environmental allergies and polydipsia. Does not bruise/bleed easily.  Psychiatric/Behavioral: Negative for suicidal ideas. The patient is not nervous/anxious.      Past Medical History  Diagnosis Date  . Respiratory arrest   . COPD (chronic obstructive pulmonary disease)   . CHF (congestive heart failure)   . Diabetes mellitus     insulin-dep  . Fibromyalgia   . Deaf     bilateral neurosensory hearing loss - Dr. Radene Journey  . Myocardial infarction   . Stroke   . GERD (gastroesophageal reflux disease)   . Depression   . Hypertension   . Pneumonia   . Coronary artery disease     a. h/o CABG  . Polymyalgia rheumatica   . Unstable angina   . PAD (peripheral artery disease)     LEAs 05/10/2012 - L SFA 50-69% diameter reduction, bilat ICA with 0-49% diameter reduction  . Tobacco abuse   . History of nuclear stress test 11/2011    lexiscan; low risk, non-diagnostic for ischemia   Past Surgical History  Procedure Laterality Date  . Coronary artery bypass graft  04/03/1993    x5 LIMA to  LAD; SVG to DX1; SVG to RCA, SVG to OM;  (Dr. Servando Snare)  . Eye surgery    . Coronary angioplasty  1995    RCA PTCA (07/23/1993); mid-LAD PTCA (08/21/1993); PTCA for restenosis RCA (09/13/1993),   . Coronary angioplasty  1998    Cfx & LAD PTCA  . Coronary angioplasty  1999    Cfx & LAD PTCA  . Coronary angioplasty  09/17/2004    cutting balloon small vessel mid LAD beyond LIMA insertion (2.5x90mm Cypher to mid SVG to RCA  . Coronary angioplasty  05/21/2010     patent LIMA-LAD, diffuse disease distally, patent SVG-small diagonal, patent SVG-right w/widely patent stent, patent SVG-OM1 (Dr. Raliegh Ip. Mali Hilty)  . Transthoracic echocardiogram  12/2010    EF=>55%, mild conc LVH; IVC non-dilated w/>50% collapse; mild mitral annular calcif, calcified mitral apparatus, mild MD; mild TR & elevated RVSP; mild-mod AV regurg  . Coronary angioplasty  07/18/2008    patent SVG to acute marginal & PDA, SVG to OM1, SVG to diagonal 1, & LIMA to LAD (Dr. Jackie Plum(  . Coronary angioplasty  04/20/2007    patent SVG-diagonal, SVG-obtuse marginal, SVG-RCA, LIMA-LAD, patent stent to SVG in RCA (Dr. Norlene Duel)  . Cardiac catheterization  04/10/2007    3x46mm Promus DES to SVG to RCA  . Coronary angioplasty  03/22/2006    patent LIMA to LAD, patent SVG to small diagonal, patent SVG to OM, patent vein graft to RV acute marginal & RCA (Dr. Gerrie Nordmann)  . Coronary angioplasty  12/03/2002    ballon angioplasty to lesion in prox LAD, prox Cfx & stenting, angioplasty to L AV groove artery (Dr. Marella Chimes)  . Coronary angioplasty  09/08/2000    Dr. Marella Chimes  . Hip pinning,cannulated  09/19/2013  . Hip pinning,cannulated Right 09/19/2013    Procedure: CANNULATED HIP PINNING;  Surgeon: Renette Butters, MD;  Location: [facility];  Service: Orthopedics;  Laterality: Right;   Fracture table, c-arm, [employer]- Mr. Murphy to call rep   Social History:  reports that she quit smoking about 5 years ago. She has never used smokeless tobacco. She reports that she does not drink alcohol or use illicit drugs.  Allergies  Allergen Reactions  . Codeine     Unknown  . Flagyl [Metronidazole]     Unknown  . Fluocinonide     Unknown  . Ranexa [Ranolazine] Other (See Comments)    Elevated liver function  . Statins Other (See Comments)    Muscle aches  . Biaxin [Clarithromycin] Rash  . Demerol [Meperidine] Rash    Family History:  Family History  Problem Relation Age of Onset  . Hypertension Father       Prior to Admission medications   Medication Sig Start Date End Date Taking? Authorizing Provider  ALPRAZolam Duanne Moron) 1 MG tablet Take 1 mg by mouth every 6 (six) hours as needed for anxiety (anxiety).    Yes Historical Provider, MD  amitriptyline (ELAVIL) 50 MG tablet Take 50 mg by mouth 2 (two) times daily. 10/01/13  Yes Samella Parr, NP  aspirin EC 81 MG tablet Take 162 mg by mouth 2 (two) times daily.   Yes Historical Provider, MD  Calcium Carb-Cholecalciferol 600-500 MG-UNIT CAPS Take 1 capsule by mouth daily.   Yes Historical Provider, MD  carvedilol (COREG) 25 MG tablet Take 25 mg by mouth 2 (two) times daily with a meal.   Yes Historical Provider, MD  clopidogrel (PLAVIX) 75 MG tablet Take 1  tablet (75 mg total) by mouth daily. 12/04/13  Yes Pixie Casino, MD  COD LIVER OIL PO Take 1 capsule by mouth daily.    Yes Historical Provider, MD  Cyanocobalamin (VITAMIN B-12 PO) Take 1 tablet by mouth daily.   Yes Historical Provider, MD  docusate sodium (COLACE) 100 MG capsule Take 1 capsule (100 mg total) by mouth 2 (two) times daily. 09/23/13  Yes Renette Butters, MD  fentaNYL (DURAGESIC - DOSED MCG/HR) 25 MCG/HR patch Place 1 patch (25 mcg total) onto the skin every 3 (three) days. 10/01/13  Yes Samella Parr, NP  folic acid (FOLVITE) 381 MCG tablet Take 1,600 mcg by mouth daily.   Yes Historical Provider, MD  furosemide (LASIX) 40 MG tablet Take 40-60 mg by mouth See admin instructions. Take 40 mg every  morning.  Take 20 mg every other evening.   Yes Historical Provider, MD  insulin glargine (LANTUS) 100 UNIT/ML injection Inject 0.28 mLs (28 Units total) into the skin daily. 09/30/13  Yes Cherene Altes, MD  insulin glargine (LANTUS) 100 UNIT/ML injection Inject 0.15 mLs (15 Units total) into the skin at bedtime. 09/30/13  Yes Cherene Altes, MD  insulin lispro (HUMALOG) 100 UNIT/ML injection Inject 4-12 Units into the skin 3 (three) times daily before meals.   Yes Historical  Provider, MD  iron polysaccharides (NU-IRON) 150 MG capsule Take 1 capsule (150 mg total) by mouth daily. 10/15/12  Yes Rebecca Eaton, MD  isosorbide mononitrate (IMDUR) 60 MG 24 hr tablet Take 1 tablet (60 mg total) by mouth 2 (two) times daily. 03/22/13  Yes Pixie Casino, MD  Lutein 20 MG CAPS Take 1 capsule by mouth daily.   Yes Historical Provider, MD  nitroGLYCERIN (NITROSTAT) 0.4 MG SL tablet Place 0.4 mg under the tongue every 5 (five) minutes as needed for chest pain (chest pain).    Yes Historical Provider, MD  omeprazole (PRILOSEC) 20 MG capsule Take 20 mg by mouth daily.   Yes Historical Provider, MD  oxyCODONE-acetaminophen (PERCOCET) 10-325 MG per tablet Take 1 tablet by mouth every 4 (four) hours as needed for pain (pain).   Yes Historical Provider, MD  potassium chloride SA (K-DUR,KLOR-CON) 20 MEQ tablet Take 30 mEq by mouth daily.   Yes Historical Provider, MD  predniSONE (DELTASONE) 2.5 MG tablet Take 2.5 mg by mouth daily with breakfast.   Yes Historical Provider, MD  Pyridoxine HCl (VITAMIN B-6 PO) Take 1 tablet by mouth daily.   Yes Historical Provider, MD  sertraline (ZOLOFT) 50 MG tablet Take 50 mg by mouth daily.   Yes Historical Provider, MD  timolol (TIMOPTIC) 0.25 % ophthalmic solution Place 1 drop into both eyes daily.    Yes Historical Provider, MD  TRAVATAN Z 0.004 % SOLN ophthalmic solution Place 1 drop into both eyes at bedtime. 06/03/13  Yes Historical Provider, MD  valsartan (DIOVAN) 40 MG tablet Take 40 mg by mouth daily.   Yes Historical Provider, MD  vitamin E 400 UNIT capsule Take 400 Units by mouth daily.   Yes Historical Provider, MD  zinc gluconate 50 MG tablet Take 50 mg by mouth daily.   Yes Historical Provider, MD   Physical Exam: Filed Vitals:   12/06/13 1930 12/06/13 2054 12/06/13 2100 12/06/13 2224  BP: 85/30 115/82 129/46   Pulse: 72 74    Temp:      TempSrc:      Resp: $Remo'14 19 12   'accOt$ Height:    5'  4" (1.626 m)  Weight:    74.4 kg (164 lb 0.4  oz)  SpO2: 91% 97%      Physical Exam  Constitutional: Appears well-developed and well-nourished. No distress.  HENT: Normocephalic. No tonsillar erythema or exudates Eyes: Conjunctivae and EOM are normal. PERRLA, no scleral icterus.  Neck: Normal ROM. Neck supple. No thyromegaly.  CVS: RRR, S1/S2 appreciated  Pulmonary: Effort and breath sounds normal, no stridor, rhonchi, wheezes, rales.  Abdominal: Soft. BS +,  no distension, tenderness, rebound or guarding.  Musculoskeletal: Normal range of motion. No edema and no tenderness.  Lymphadenopathy: No lymphadenopathy noted, cervical, inguinal. Neuro: Alert. Normal reflexes, muscle tone coordination. No focal neurologic deficits. Skin: Skin is warm and dry. Marland Kitchen No erythema. No pallor.  Psychiatric: Normal mood and affect. Behavior, judgment, thought content normal.   Labs on Admission:  Basic Metabolic Panel:  Recent Labs Lab 12/06/13 1320  NA 139  K 3.6*  CL 96  CO2 28  GLUCOSE 114*  BUN 25*  CREATININE 1.00  CALCIUM 9.3   Liver Function Tests:  Recent Labs Lab 12/06/13 1320  AST 21  ALT 17  ALKPHOS 100  BILITOT 0.3  PROT 7.8  ALBUMIN 3.8   No results found for this basename: LIPASE, AMYLASE,  in the last 168 hours No results found for this basename: AMMONIA,  in the last 168 hours CBC:  Recent Labs Lab 12/06/13 1320  WBC 13.9*  NEUTROABS 10.1*  HGB 12.1  HCT 36.1  MCV 87.6  PLT 280   Cardiac Enzymes:  Recent Labs Lab 12/06/13 1320  TROPONINI <0.30   BNP: No components found with this basename: POCBNP,  CBG: No results found for this basename: GLUCAP,  in the last 168 hours  If 7PM-7AM, please contact night-coverage [email or web address] Password [facility] 12/06/2013, 10:54 PM

## 2013-12-06 NOTE — ED Provider Notes (Signed)
CSN: [id number]     Arrival date & time 12/06/13  1204 History   First MD Initiated Contact with Patient 12/06/13 1231     Chief Complaint  Patient presents with  . Fever     (Consider location/radiation/quality/duration/timing/severity/associated sxs/prior Treatment) HPI  History supplied by the patient who is hard of hearing and her Partner of 25 years  MARDY HOPPE is a 63 y.o. female with past medical history significant for COPD, CHF, insulin-dependent diabetes, CAD, CVA, ischemic cardiomyopathy with EF of 35%, tobacco abuse, recently admitted for left hip fracture, incidentally found to have a urinary tract infection, discharged 5 weeks ago with home PT had an episode of confusion and lethargy this morning, patient was found to be hypoglycemic in the 40s, she was administered orange juice and sugar with improving mentation however she started vomiting several times afterwards. The last emesis was approximately 9 AM this morning. Patient also reports a mild epigastric abdominal pain, she denies fever or chills, cough, chest pain, shortness of breath, rash, cervicalgia, confusion. She endorses a mild frontal headache, denies change in bowel movements or dysuria.  Past Medical History  Diagnosis Date  . Respiratory arrest   . COPD (chronic obstructive pulmonary disease)   . CHF (congestive heart failure)   . Diabetes mellitus     insulin-dep  . Fibromyalgia   . Deaf     bilateral neurosensory hearing loss - Dr. Narda Bonds  . Myocardial infarction   . Stroke   . GERD (gastroesophageal reflux disease)   . Depression   . Hypertension   . Pneumonia   . Coronary artery disease     a. h/o CABG  . Polymyalgia rheumatica   . Unstable angina   . PAD (peripheral artery disease)     LEAs 05/10/2012 - L SFA 50-69% diameter reduction, bilat ICA with 0-49% diameter reduction  . Tobacco abuse   . History of nuclear stress test 11/2011    lexiscan; low risk, non-diagnostic for ischemia    Past Surgical History  Procedure Laterality Date  . Coronary artery bypass graft  04/03/1993    x5 LIMA to LAD; SVG to DX1; SVG to RCA, SVG to OM;  (Dr. Tyrone Sage)  . Eye surgery    . Coronary angioplasty  1995    RCA PTCA (07/23/1993); mid-LAD PTCA (08/21/1993); PTCA for restenosis RCA (09/13/1993),   . Coronary angioplasty  1998    Cfx & LAD PTCA  . Coronary angioplasty  1999    Cfx & LAD PTCA  . Coronary angioplasty  09/17/2004    cutting balloon small vessel mid LAD beyond LIMA insertion (2.5x21mm Cypher to mid SVG to RCA  . Coronary angioplasty  05/21/2010    patent LIMA-LAD, diffuse disease distally, patent SVG-small diagonal, patent SVG-right w/widely patent stent, patent SVG-OM1 (Dr. Kirtland Bouchard. [place])  . Transthoracic echocardiogram  12/2010    EF=>55%, mild conc LVH; IVC non-dilated w/>50% collapse; mild mitral annular calcif, calcified mitral apparatus, mild MD; mild TR & elevated RVSP; mild-mod AV regurg  . Coronary angioplasty  07/18/2008    patent SVG to acute marginal & PDA, SVG to OM1, SVG to diagonal 1, & LIMA to LAD (Dr. Evlyn Courier(  . Coronary angioplasty  04/20/2007    patent SVG-diagonal, SVG-obtuse marginal, SVG-RCA, LIMA-LAD, patent stent to SVG in RCA (Dr. Claudia Desanctis)  . Cardiac catheterization  04/10/2007    3x69mm Promus DES to SVG to RCA  . Coronary angioplasty  03/22/2006    patent  LIMA to LAD, patent SVG to small diagonal, patent SVG to OM, patent vein graft to RV acute marginal & RCA (Dr. Laurell Josephs)  . Coronary angioplasty  12/03/2002    ballon angioplasty to lesion in prox LAD, prox Cfx & stenting, angioplasty to L AV groove artery (Dr. Jonette Eva)  . Coronary angioplasty  09/08/2000    Dr. Jonette Eva  . Hip pinning,cannulated  09/19/2013  . Hip pinning,cannulated Right 09/19/2013    Procedure: CANNULATED HIP PINNING;  Surgeon: Sheral Apley, MD;  Location: [facility] OR;  Service: Orthopedics;  Laterality: Right;   Fracture table, c-arm, [employer]- Mr. Murphy to call rep    Family History  Problem Relation Age of Onset  . Hypertension Father    History  Substance Use Topics  . Smoking status: Former Smoker    Quit date: 01/19/2008  . Smokeless tobacco: Never Used  . Alcohol Use: No   OB History   Grav Para Term Preterm Abortions TAB SAB Ect Mult Living                 Review of Systems  10 systems reviewed and found to be negative, except as noted in the HPI.   Allergies  Codeine; Flagyl; Fluocinonide; Ranexa; Statins; Biaxin; and Demerol  Home Medications   Prior to Admission medications   Medication Sig Start Date End Date Taking? Authorizing Provider  ALPRAZolam Prudy Feeler) 1 MG tablet Take 1 mg by mouth every 6 (six) hours as needed for anxiety.    Historical Provider, MD  amitriptyline (ELAVIL) 50 MG tablet Take 50 mg by mouth 2 (two) times daily. 10/01/13   Russella Dar, NP  aspirin EC 81 MG tablet Take 162 mg by mouth 2 (two) times daily.    Historical Provider, MD  Calcium Carb-Cholecalciferol 600-500 MG-UNIT CAPS Take 1 capsule by mouth daily.    Historical Provider, MD  carvedilol (COREG) 25 MG tablet Take 25 mg by mouth 2 (two) times daily with a meal.    Historical Provider, MD  clopidogrel (PLAVIX) 75 MG tablet Take 1 tablet (75 mg total) by mouth daily. 12/04/13   Chrystie Nose, MD  COD LIVER OIL PO Take 1 capsule by mouth daily.     Historical Provider, MD  Cyanocobalamin (VITAMIN B-12 PO) Take 1 tablet by mouth daily.    Historical Provider, MD  docusate sodium (COLACE) 100 MG capsule Take 1 capsule (100 mg total) by mouth 2 (two) times daily. 09/23/13   Sheral Apley, MD  fentaNYL (DURAGESIC - DOSED MCG/HR) 25 MCG/HR patch Place 1 patch (25 mcg total) onto the skin every 3 (three) days. 10/01/13   Russella Dar, NP  folic acid (FOLVITE) 800 MCG tablet Take 1,600 mcg by mouth daily.    Historical Provider, MD  furosemide (LASIX) 40 MG tablet Take 40-60 mg by mouth See admin instructions. Take 40 mg every  morning.  Take 20  mg every other evening.    Historical Provider, MD  insulin glargine (LANTUS) 100 UNIT/ML injection Inject 0.28 mLs (28 Units total) into the skin daily. 09/30/13   Lonia Blood, MD  insulin glargine (LANTUS) 100 UNIT/ML injection Inject 0.15 mLs (15 Units total) into the skin at bedtime. 09/30/13   Lonia Blood, MD  iron polysaccharides (NU-IRON) 150 MG capsule Take 1 capsule (150 mg total) by mouth daily. 10/15/12   Governor Rooks, MD  isosorbide mononitrate (IMDUR) 60 MG 24 hr tablet Take 1 tablet (60 mg  total) by mouth 2 (two) times daily. 03/22/13   Chrystie Nose, MD  Lutein 20 MG CAPS Take 1 capsule by mouth daily.    Historical Provider, MD  nitroGLYCERIN (NITROSTAT) 0.4 MG SL tablet Place 0.4 mg under the tongue every 5 (five) minutes as needed for chest pain.    Historical Provider, MD  omeprazole (PRILOSEC) 20 MG capsule Take 20 mg by mouth daily.    Historical Provider, MD  oxyCODONE-acetaminophen (PERCOCET) 10-325 MG per tablet Take 1 tablet by mouth every 4 (four) hours as needed for pain. 10/01/13   Russella Dar, NP  potassium chloride SA (K-DUR,KLOR-CON) 20 MEQ tablet Take 30 mEq by mouth daily.    Historical Provider, MD  predniSONE (DELTASONE) 5 MG tablet Take 1 tablet (5 mg total) by mouth daily with breakfast. 09/30/13   Lonia Blood, MD  Pyridoxine HCl (VITAMIN B-6 PO) Take 1 tablet by mouth daily.    Historical Provider, MD  sertraline (ZOLOFT) 50 MG tablet Take 50 mg by mouth daily.    Historical Provider, MD  timolol (TIMOPTIC) 0.25 % ophthalmic solution Place 1 drop into both eyes daily.     Historical Provider, MD  TRAVATAN Z 0.004 % SOLN ophthalmic solution Place 1 drop into both eyes at bedtime. 06/03/13   Historical Provider, MD  valsartan (DIOVAN) 40 MG tablet Take 40 mg by mouth daily.    Historical Provider, MD  vitamin E 400 UNIT capsule Take 400 Units by mouth daily.    Historical Provider, MD  zinc gluconate 50 MG tablet Take 50 mg by mouth daily.     Historical Provider, MD  ZINC GLUCONATE PO Take by mouth.    Historical Provider, MD   BP 110/39  Pulse 78  Temp(Src) 100 F (37.8 C) (Oral)  Resp 18  SpO2 89% Physical Exam  Nursing note and vitals reviewed. Constitutional: She is oriented to person, place, and time. She appears well-developed and well-nourished. No distress.  Hard of hearing  HENT:  Head: Normocephalic and atraumatic.  Mouth/Throat: Oropharynx is clear and moist.  Eyes: Conjunctivae and EOM are normal. Pupils are equal, round, and reactive to light.  Neck: Normal range of motion.  FROM to C-spine. Pt can touch chin to chest without discomfort. No TTP of midline cervical spine.   Cardiovascular: Normal rate, regular rhythm and intact distal pulses.   Pulmonary/Chest: Effort normal and breath sounds normal. No stridor. No respiratory distress. She has no wheezes. She has no rales. She exhibits no tenderness.  Abdominal: Soft. Bowel sounds are normal. She exhibits no distension and no mass. There is tenderness. There is no rebound and no guarding.  Tender to palpation of the epigastrium, no guarding or rebound  Genitourinary:  No CVA tenderness palpation bilaterally  Musculoskeletal: Normal range of motion. She exhibits no edema.  Right lower extremity vein harvesting scar, well-healed, no edema or tenderness palpation, Homans sign negative, no superficial collaterals or palpable cords.  Neurological: She is alert and oriented to person, place, and time.  Skin: No rash noted.  Psychiatric: She has a normal mood and affect.    ED Course  Procedures (including critical care time) Labs Review Labs Reviewed  CBC WITH DIFFERENTIAL - Abnormal; Notable for the following:    WBC 13.9 (*)    Neutro Abs 10.1 (*)    Monocytes Absolute 1.2 (*)    All other components within normal limits  BASIC METABOLIC PANEL - Abnormal; Notable for the following:  Potassium 3.6 (*)    Glucose, Bld 114 (*)    BUN 25 (*)    GFR  calc non Af Amer 59 (*)    GFR calc Af Amer 68 (*)    All other components within normal limits  URINALYSIS, ROUTINE W REFLEX MICROSCOPIC - Abnormal; Notable for the following:    APPearance CLOUDY (*)    Hgb urine dipstick TRACE (*)    Protein, ur 30 (*)    Leukocytes, UA LARGE (*)    All other components within normal limits  I-STAT CG4 LACTIC ACID, ED - Abnormal; Notable for the following:    Lactic Acid, Venous 2.21 (*)    All other components within normal limits  CULTURE, BLOOD (ROUTINE X 2)  CULTURE, BLOOD (ROUTINE X 2)  URINE CULTURE  HEPATIC FUNCTION PANEL  TROPONIN I  URINE MICROSCOPIC-ADD ON    Imaging Review Dg Chest 2 View  12/06/2013   CLINICAL DATA:  Nausea, vomiting. History of diabetes and hypertension.  EXAM: CHEST  2 VIEW  COMPARISON:  09/25/2013  FINDINGS: Prior CABG. Heart is normal size. Linear atelectasis in the left base. Right lung is clear. No effusions. No acute bony abnormality.  IMPRESSION: Left basilar atelectasis.   Electronically Signed   By: Charlett Nose M.D.   On: 12/06/2013 12:56     EKG Interpretation None      MDM   Final diagnoses:  None    Filed Vitals:   12/06/13 1209 12/06/13 1211 12/06/13 1430  BP: 114/32  110/39  Pulse: 82  78  Temp: 100 F (37.8 C)    TempSrc: Oral    Resp:   18  SpO2: 89% 97% 89%    Medications  morphine 4 MG/ML injection 4 mg (not administered)  sodium chloride 0.9 % bolus 500 mL (0 mLs Intravenous Stopped 12/06/13 1431)  acetaminophen (TYLENOL) tablet 650 mg (650 mg Oral Given 12/06/13 1351)  ondansetron (ZOFRAN) injection 4 mg (4 mg Intravenous Given 12/06/13 1350)    ORIYA KETTERING is a 63 y.o. female presenting with fever, episode of hypoglycemia, nausea vomiting. Patient is mildly elevated lactic acid 2.21. White blood cell count of 13.9. Denies cough, shortness of breath, chest pain. Patient will general see for workup. Will be given medication for her headache.  1:45 PM Pt refusing ABD CT, patient  states she doesn't need a CAT scan because she had one several months ago. I have explained to her she is febrile today and that she is also vomiting today, I think it situation may change I recommend CAT scan at this time. Patient again declines I have asked her to reconsider and we will revisit this a later time.  2:58 checked with the nurse, the patient is refusing CT and urine cath  Case signed out to Dr. Juleen China at shift change       Wynetta Emery, PA-C 12/07/13 1305

## 2013-12-07 LAB — CBC WITH DIFFERENTIAL/PLATELET

## 2013-12-07 LAB — COMPREHENSIVE METABOLIC PANEL
ALBUMIN: 3.2 g/dL — AB (ref 3.5–5.2)
ALK PHOS: 84 U/L (ref 39–117)
ALT: 19 U/L (ref 0–35)
ALT: 14 U/L (ref 0–35)
ANION GAP: 10 (ref 5–15)
ANION GAP: 13 (ref 5–15)
AST: 20 U/L (ref 0–37)
AST: 39 U/L — AB (ref 0–37)
Albumin: 3 g/dL — ABNORMAL LOW (ref 3.5–5.2)
Alkaline Phosphatase: 90 U/L (ref 39–117)
BILIRUBIN TOTAL: 0.3 mg/dL (ref 0.3–1.2)
BILIRUBIN TOTAL: 0.3 mg/dL (ref 0.3–1.2)
BUN: 24 mg/dL — AB (ref 6–23)
BUN: 27 mg/dL — AB (ref 6–23)
CALCIUM: 8.5 mg/dL (ref 8.4–10.5)
CHLORIDE: 103 meq/L (ref 96–112)
CO2: 23 mEq/L (ref 19–32)
CO2: 24 mEq/L (ref 19–32)
CREATININE: 0.87 mg/dL (ref 0.50–1.10)
CREATININE: 0.87 mg/dL (ref 0.50–1.10)
Calcium: 8.4 mg/dL (ref 8.4–10.5)
Chloride: 98 mEq/L (ref 96–112)
GFR calc Af Amer: 81 mL/min — ABNORMAL LOW (ref >90)
GFR calc Af Amer: 81 mL/min — ABNORMAL LOW (ref >90)
GFR calc non Af Amer: 70 mL/min — ABNORMAL LOW (ref >90)
GFR calc non Af Amer: 70 mL/min — ABNORMAL LOW (ref >90)
Glucose, Bld: 153 mg/dL — ABNORMAL HIGH (ref 70–99)
Glucose, Bld: 205 mg/dL — ABNORMAL HIGH (ref 70–99)
POTASSIUM: 4.4 meq/L (ref 3.7–5.3)
Potassium: 5.2 mEq/L (ref 3.7–5.3)
Sodium: 134 mEq/L — ABNORMAL LOW (ref 137–147)
Sodium: 137 mEq/L (ref 137–147)
TOTAL PROTEIN: 6.5 g/dL (ref 6.0–8.3)
Total Protein: 7.2 g/dL (ref 6.0–8.3)

## 2013-12-07 LAB — CBC
HCT: 34 % — ABNORMAL LOW (ref 36.0–46.0)
HEMOGLOBIN: 11.4 g/dL — AB (ref 12.0–15.0)
MCH: 29.6 pg (ref 26.0–34.0)
MCHC: 33.5 g/dL (ref 30.0–36.0)
MCV: 88.3 fL (ref 78.0–100.0)
Platelets: 326 10*3/uL (ref 150–400)
RBC: 3.85 MIL/uL — ABNORMAL LOW (ref 3.87–5.11)
RDW: 13.1 % (ref 11.5–15.5)
WBC: 16.2 10*3/uL — AB (ref 4.0–10.5)

## 2013-12-07 LAB — PHOSPHORUS: PHOSPHORUS: 3 mg/dL (ref 2.3–4.6)

## 2013-12-07 LAB — GLUCOSE, CAPILLARY
GLUCOSE-CAPILLARY: 272 mg/dL — AB (ref 70–99)
Glucose-Capillary: 145 mg/dL — ABNORMAL HIGH (ref 70–99)
Glucose-Capillary: 186 mg/dL — ABNORMAL HIGH (ref 70–99)
Glucose-Capillary: 186 mg/dL — ABNORMAL HIGH (ref 70–99)
Glucose-Capillary: 239 mg/dL — ABNORMAL HIGH (ref 70–99)

## 2013-12-07 LAB — PROTIME-INR
INR: 0.97 (ref 0.00–1.49)
PROTHROMBIN TIME: 12.9 s (ref 11.6–15.2)

## 2013-12-07 LAB — APTT: aPTT: 22 seconds — ABNORMAL LOW (ref 24–37)

## 2013-12-07 LAB — MAGNESIUM: Magnesium: 2.1 mg/dL (ref 1.5–2.5)

## 2013-12-07 LAB — HEMOGLOBIN A1C
Hgb A1c MFr Bld: 10.1 % — ABNORMAL HIGH (ref <5.7)
Mean Plasma Glucose: 243 mg/dL — ABNORMAL HIGH (ref <117)

## 2013-12-07 LAB — MRSA PCR SCREENING: MRSA by PCR: NEGATIVE

## 2013-12-07 MED ORDER — SODIUM CHLORIDE 0.9 % IV SOLN: INTRAVENOUS | Status: DC

## 2013-12-07 MED ORDER — SENNOSIDES-DOCUSATE SODIUM 8.6-50 MG PO TABS
1.0000 | ORAL_TABLET | Freq: Two times a day (BID) | ORAL | Status: DC
  Administered 2013-12-07 – 2013-12-09 (×5): 1 via ORAL
  Filled 2013-12-07 (×5): qty 1

## 2013-12-07 MED ORDER — OXYCODONE HCL 5 MG PO TABS
5.0000 mg | ORAL_TABLET | ORAL | Status: DC | PRN
  Administered 2013-12-07 – 2013-12-09 (×5): 5 mg via ORAL
  Filled 2013-12-07 (×4): qty 1

## 2013-12-07 MED ORDER — OXYCODONE-ACETAMINOPHEN 5-325 MG PO TABS
1.0000 | ORAL_TABLET | ORAL | Status: DC | PRN
  Administered 2013-12-07: 1 via ORAL
  Filled 2013-12-07: qty 1

## 2013-12-07 NOTE — Progress Notes (Signed)
PROGRESS NOTE  [name] NFA:[id number] DOB: 18-Nov-1950 DOA: 12/06/2013 PCP: Alonza Bogus, MD  HPI: 63 -year-old female with multiple medical comorbidities including CAD and quite extensive coronary angiographies (most recently seen in Dr. Lysbeth Penner office who said she is not a candidate for repeat cardiac surgery or intervention and at that time she was placed on Duragesic patch), recent hip surgery for hip fracture, has developed delirium after the procedure, has suffered a non-STEMI during that hospitalization but because of her significant coronary disease cardiac cath was not recommended (hospitalization in 09/2013), history of polymyalgia rheumatic on low dose prednisone, anxiety and depression, diabetes, dyslipidemia who presented to [facility] ED 12/06/2013 with altered mental status on the morning prior to this admission per family and found to have sepsis in the setting of a UTI.   Subjective/ 24 H Interval events - feeling better this morning, her friend tells me that her mental status is much clear - denies chest pain or breathing difficulties   Assessment/Plan: Sepsis secondary to UTI  Sepsis criteria met with initial vital signs that included hypertension, low-grade fever in addition to leukocytosis and lactic acidosis patient had evidence of infection based on urinalysis which showed large leukocytes and too numerous to count white blood cells.  Continue IVF until they expire, patient with mild LE edema  Most recent 2-D echo in July 2015 showed ejection fraction of 55-60%.  Continue Vancomycin and Zosyn, narrow based on cultures when available.  BP better, resumed home medications Acute encephalopathy  Likely secondary to urinary tract infection.  Mental status improved this morning.  Diabetes mellitus type 1, uncontrolled, insulin dependent with complications of neuropathy  Check A1c on this admission. A1c in July 2014 10.4 indicating poor glycemic control.  Continue Lantus 20  units in the morning and 15 units at bedtime in addition to sliding scale Humalog. Polymyalgia rheumatica  On low dose prednisone HTN (hypertension)  Continue Coreg, Lasix, Imdur, Irbesartan CAD with multiple PCIs / Cardiomyopathy, ischemic / History of recent NSTEMI with recent PCI to SVG to RCA (3.0 x 15 Xience expedition DES) in 2013, no further percutaneous options / Chronic diastolic CHF  Stable, no complaints of chest pain.  Resume aspirin and Plavix.  Resume Coreg, Lasix, Imdur, Diovan, Irbesartan  Hip fracture- surgery 09/19/13  Resume home analgesics. Started morphine 2 mg IV every 4 hours when necessary for severe pain. Anxiety and depression  Continue Xanax and Elavil, Zoloft per home regimen Left Upper pole kidney density   Per CT c/w hemorrhagic/proteinaceous cyst, Dr. Charlies Silvers discussed with GU and Cardiology and patient OK to take Aspirin and Plavix.   Patient aware of this lesion and it was noted on CT abdomen as far as 2008  Diet: Carb Modified Fluids: NS 75 cc/h DVT Prophylaxis: Aspirin, Plavix, SCD  Code Status: Full Family Communication: partner bedside  Disposition Plan: transfer to telemetry in the afternoon if stable  Consultants:  None   Procedures:  None    Antibiotics Vancomycin 9/25 >> Zosyn 9/25 >>  Studies  Filed Vitals:   12/07/13 0300 12/07/13 0400 12/07/13 0500 12/07/13 0600  BP: 130/42 137/43 120/64 141/39  Pulse: 78 74 75 77  Temp:  100.7 F (38.2 C)    TempSrc:  Oral    Resp: _0 Height:      Weight:  75.5 kg (166 lb 7.2 oz)    SpO2: 97% 97% 98% 97%    Intake/Output Summary (Last 24 hours) at 12/07/13 [phone]  Last data filed at 12/07/13 0600  Gross per 24 hour  Intake 2657.5 ml  Output   1800 ml  Net  857.5 ml   Filed Weights   12/06/13 2224 12/07/13 0400  Weight: 74.4 kg (164 lb 0.4 oz) 75.5 kg (166 lb 7.2 oz)   Exam:  General:  NAD  Cardiovascular: RRR, no MRG  Respiratory: CTA biL on anterior  auscultation  Abdomen: soft, non tender  MSK: 1+ LE edema  Neuro: non focal  Data Reviewed: Basic Metabolic Panel:  Recent Labs Lab 12/06/13 1320 12/06/13 2359 12/07/13 0343  NA 139 134* 137  K 3.6* 5.2 4.4  CL 96 98 103  CO2 _0 GLUCOSE 114* 153* 205*  BUN 25* 27* 24*  CREATININE 1.00 0.87 0.87  CALCIUM 9.3 8.5 8.4  MG  --  2.1  --   PHOS  --  3.0  --    Liver Function Tests:  Recent Labs Lab 12/06/13 1320 12/06/13 2359 12/07/13 0343  AST 21 39* 20  ALT _1 ALKPHOS 100 90 84  BILITOT 0.3 0.3 0.3  PROT 7.8 7.2 6.5  ALBUMIN 3.8 3.2* 3.0*   CBC:  Recent Labs Lab 12/06/13 1320 12/06/13 2359 12/07/13 0343  WBC 13.9* TEST RESCHEDULED PER TIFFANY RN 16.2*  NEUTROABS 10.1* PENDING  --   HGB 12.1 TEST RESCHEDULED PER TIFFANY RN 11.4*  HCT 36.1 TEST RESCHEDULED PER TIFFANY RN 34.0*  MCV 87.6 TEST RESCHEDULED PER TIFFANY RN 88.3  PLT 280 TEST RESCHEDULED PER TIFFANY RN 326   Cardiac Enzymes:  Recent Labs Lab 12/06/13 1320  TROPONINI <0.30   BNP (last 3 results)  Recent Labs  09/18/13 2221  PROBNP 700.3*   CBG:  Recent Labs Lab 12/06/13 2237 12/07/13 0009  GLUCAP 168* 145*    Recent Results (from the past 240 hour(s))  MRSA PCR SCREENING     Status: None   Collection Time    12/06/13 11:44 PM      Result Value Ref Range Status   MRSA by PCR NEGATIVE  NEGATIVE Final   Comment:            The GeneXpert MRSA Assay (FDA     approved for NASAL specimens     only), is one component of a     comprehensive MRSA colonization     surveillance program. It is not     intended to diagnose MRSA     infection nor to guide or     monitor treatment for     MRSA infections.     Studies: Ct Abdomen Pelvis Wo Contrast 12/06/2013  Moderate amount of retained large bowel stool without bowel obstruction or acute intra-abdominal/pelvic process.  Density within upper pole LEFT kidney most consistent with hemorrhagic/ proteinaceous cyst.  Dg  Chest 2 View 12/06/2013  Prior CABG. Heart is normal size. Linear atelectasis in the left base. Right lung is clear. No effusions. No acute bony abnormality.  Scheduled Meds: . amitriptyline  50 mg Oral BID  . aspirin EC  162 mg Oral BID  . calcium-vitamin D  1 tablet Oral Q breakfast  . carvedilol  25 mg Oral BID WC  . clopidogrel  75 mg Oral Q breakfast  . docusate sodium  100 mg Oral BID  . fentaNYL  25 mcg Transdermal Q72H  . folic acid  1.5 mg Oral Daily  . furosemide  20 mg Oral Q48H  . furosemide  40 mg Oral  Daily  . insulin aspart  0-20 Units Subcutaneous TID WC  . insulin aspart  0-5 Units Subcutaneous QHS  . insulin glargine  15 Units Subcutaneous QHS  . insulin glargine  28 Units Subcutaneous Daily  . irbesartan  75 mg Oral Daily  . iron polysaccharides  150 mg Oral Daily  . isosorbide mononitrate  60 mg Oral BID  . latanoprost  1 drop Both Eyes QHS  . multivitamin  1 tablet Oral Daily  . pantoprazole  40 mg Oral Daily  . piperacillin-tazobactam (ZOSYN)  IV  3.375 g Intravenous Q8H  . polyethylene glycol  17 g Oral BID  . potassium chloride SA  30 mEq Oral Daily  . predniSONE  2.5 mg Oral Q breakfast  . vitamin B-6  25 mg Oral Daily  . sertraline  50 mg Oral Daily  . sodium chloride  3 mL Intravenous Q12H  . timolol  1 drop Both Eyes Daily  . vancomycin  750 mg Intravenous Q12H  . vitamin B-12  100 mcg Oral Daily  . vitamin E  400 Units Oral Daily  . zinc sulfate  220 mg Oral Daily   Continuous Infusions: . sodium chloride 75 mL/hr (12/06/13 2246)    Principal Problem:   Sepsis Active Problems:   Diabetes mellitus type 1, uncontrolled, insulin dependent   Polymyalgia rheumatica   HTN (hypertension)   CAD- multiple PCIs   Peripheral autonomic neuropathy due to diabetes mellitus   Hip fracture- surgery 09/19/13   Anxiety   Cardiomyopathy, ischemic- EF 35-40% at cath Oct 2014   UTI (urinary tract infection)   Hypokalemia   Time spent: Simms,  MD [facility] Pager [phone]. If 7 PM - 7 AM, please contact night-coverage at [email or web address], password [facility] 12/07/2013, 7:11 AM  LOS: 1 day

## 2013-12-07 NOTE — ED Provider Notes (Signed)
History/physical exam/procedure(s) were performed by non-physician practitioner and as supervising physician I was immediately available for consultation/collaboration. I have reviewed all notes and am in agreement with care and plan.   Hilario Quarry, MD 12/07/13 [phone]

## 2013-12-08 LAB — GLUCOSE, CAPILLARY
GLUCOSE-CAPILLARY: 259 mg/dL — AB (ref 70–99)
GLUCOSE-CAPILLARY: 191 mg/dL — AB (ref 70–99)
Glucose-Capillary: 286 mg/dL — ABNORMAL HIGH (ref 70–99)
Glucose-Capillary: 114 mg/dL — ABNORMAL HIGH (ref 70–99)
Glucose-Capillary: 61 mg/dL — ABNORMAL LOW (ref 70–99)

## 2013-12-08 LAB — URINE CULTURE

## 2013-12-08 LAB — CBC
HCT: 27.1 % — ABNORMAL LOW (ref 36.0–46.0)
Hemoglobin: 9 g/dL — ABNORMAL LOW (ref 12.0–15.0)
MCH: 29.6 pg (ref 26.0–34.0)
MCHC: 33.2 g/dL (ref 30.0–36.0)
MCV: 89.1 fL (ref 78.0–100.0)
PLATELETS: 265 10*3/uL (ref 150–400)
RBC: 3.04 MIL/uL — ABNORMAL LOW (ref 3.87–5.11)
RDW: 13.4 % (ref 11.5–15.5)
WBC: 9.3 10*3/uL (ref 4.0–10.5)

## 2013-12-08 LAB — COMPREHENSIVE METABOLIC PANEL
ALBUMIN: 2.6 g/dL — AB (ref 3.5–5.2)
ALT: 13 U/L (ref 0–35)
AST: 15 U/L (ref 0–37)
Alkaline Phosphatase: 82 U/L (ref 39–117)
Anion gap: 10 (ref 5–15)
BUN: 20 mg/dL (ref 6–23)
CALCIUM: 8.3 mg/dL — AB (ref 8.4–10.5)
CHLORIDE: 99 meq/L (ref 96–112)
CO2: 24 mEq/L (ref 19–32)
CREATININE: 1.05 mg/dL (ref 0.50–1.10)
GFR calc Af Amer: 65 mL/min — ABNORMAL LOW (ref >90)
GFR calc non Af Amer: 56 mL/min — ABNORMAL LOW (ref >90)
Glucose, Bld: 279 mg/dL — ABNORMAL HIGH (ref 70–99)
Potassium: 4.8 mEq/L (ref 3.7–5.3)
Sodium: 133 mEq/L — ABNORMAL LOW (ref 137–147)
Total Bilirubin: 0.3 mg/dL (ref 0.3–1.2)
Total Protein: 6.1 g/dL (ref 6.0–8.3)

## 2013-12-08 LAB — VANCOMYCIN, TROUGH: VANCOMYCIN TR: 21.2 ug/mL — AB (ref 10.0–20.0)

## 2013-12-08 MED ORDER — GLYCERIN (LAXATIVE) 2.1 G RE SUPP
1.0000 | Freq: Every day | RECTAL | Status: DC | PRN
  Administered 2013-12-08: 1 via RECTAL
  Filled 2013-12-08: qty 1

## 2013-12-08 MED ORDER — VANCOMYCIN HCL IN DEXTROSE 1-5 GM/200ML-% IV SOLN
1000.0000 mg | INTRAVENOUS | Status: DC
  Administered 2013-12-08: 1000 mg via INTRAVENOUS
  Filled 2013-12-08: qty 200

## 2013-12-08 MED ORDER — INSULIN ASPART 100 UNIT/ML ~~LOC~~ SOLN
3.0000 [IU] | Freq: Three times a day (TID) | SUBCUTANEOUS | Status: DC
  Administered 2013-12-08 – 2013-12-09 (×2): 3 [IU] via SUBCUTANEOUS

## 2013-12-08 NOTE — Progress Notes (Addendum)
ANTIBIOTIC CONSULT NOTE - FOLLOW UP  Pharmacy Consult for Vancomycin, Zosyn Indication: sepsis 2/2 UTI  Allergies  Allergen Reactions  . Codeine     Unknown  . Flagyl [Metronidazole]     Unknown  . Fluocinonide     Unknown  . Ranexa [Ranolazine] Other (See Comments)    Elevated liver function  . Statins Other (See Comments)    Muscle aches  . Biaxin [Clarithromycin] Rash  . Demerol [Meperidine] Rash    Patient Measurements: Height: _0  (162.6 cm) Weight: 168 lb 6.9 oz (76.4 kg) IBW/kg (Calculated) : 54.7  Vital Signs: Temp: 98.9 F (37.2 C) (09/27 0456) Temp src: Oral (09/27 0456) BP: 113/39 mmHg (09/27 0456) Pulse Rate: 67 (09/27 0456) Intake/Output from previous day: 09/26 0701 - 09/27 0700 In: 1887.5 [P.O.:900; I.V.:100; IV Piggyback:412.5] Out: 2275 [Urine:2275] Intake/Output from this shift: Total I/O In: -  Out: 500 [Urine:500]  Labs:  Recent Labs  12/06/13 2359 12/07/13 0343 12/08/13 0431  WBC TEST RESCHEDULED PER TIFFANY RN 16.2* 9.3  HGB TEST RESCHEDULED PER TIFFANY RN 11.4* 9.0*  PLT TEST RESCHEDULED PER TIFFANY RN [phone]  CREATININE 0.87 0.87 1.05   Estimated Creatinine Clearance: 55.6 ml/min (by C-G formula based on Cr of 1.05). No results found for this basename: VANCOTROUGH, VANCOPEAK, VANCORANDOM, GENTTROUGH, GENTPEAK, GENTRANDOM, TOBRATROUGH, TOBRAPEAK, TOBRARND, AMIKACINPEAK, AMIKACINTROU, AMIKACIN,  in the last 72 hours    Assessment: 9 yof presents with fever and hypoglycemia. She was recently admitted In July for L hip fx.  H/o UTI. Presents with fever, nausea and vomiting. Upon admission, pharmacy requested to dose Vancomycin and Zosyn for sepsis.  9/25 >>vanco >> 9/25 >> cefepime >> 9/25 9/26 >> Zosyn >>  Tmax: 100.7 WBCs: improved to WNL Renal: Scr = 1.05 with CrCl (CG) ~ 66 ml/min; (n) ~63 ml/min  9/25 blood x 2: ngtd 9/25 urine: 50,000 cfu/ml multiple morphotypes, none predominant 9/25 MRSA screen (-) 9/25 UA showed  large leukocytes and too numerous to count WBC  Today is day #3 Vanc 1500 x 1 then 744m q12h and day #2 Zosyn EI for sepsis 2/2 UTI. Per MD's note today, plan is to continue Vancomycin and Zosyn for 48 hours and transition to oral agents if blood cultures remain negative. Since we're planning on an additional 48h, will check VT tonight.  Targeting higher trough since patient met sepsis criteria.  Goal of Therapy:  Vancomycin trough level 15-20 mcg/ml Doses adjusted per renal function Eradication of infection  Plan:  1.  Vancomycin trough tonight at steady state. 2.  Continue Zosyn 3.375g IV q8h (4 hour infusion time).  3.  F/u culture results and plans for deescalation of antibiotics.  RHershal Coria9/27/2015,10:51 AM

## 2013-12-08 NOTE — Progress Notes (Signed)
Pharmacy Consult Note - Vancomycin follow up  Labs: vanc trough 21.4  A/P: Vanc trough supratherpeutic (goal now 10-15 mcg/mL). Reduce dose from  IV q12 to 1g q24  Hessie Knows, PharmD, BCPS Pager [phone] 12/08/2013 8:56 PM

## 2013-12-08 NOTE — Progress Notes (Signed)
PROGRESS NOTE  [name] VQM:[id number] DOB: 16-Apr-1950 DOA: 12/06/2013 PCP: Alonza Bogus, MD  HPI: 63 -year-old female with multiple medical comorbidities including CAD and quite extensive coronary angiographies (most recently seen in Dr. Lysbeth Penner office who said she is not a candidate for repeat cardiac surgery or intervention and at that time she was placed on Duragesic patch), recent hip surgery for hip fracture, has developed delirium after the procedure, has suffered a non-STEMI during that hospitalization but because of her significant coronary disease cardiac cath was not recommended (hospitalization in 09/2013), history of polymyalgia rheumatic on low dose prednisone, anxiety and depression, diabetes, dyslipidemia who presented to [facility] 12/06/2013 with altered mental status on the morning prior to this admission per family and found to have sepsis in the setting of a UTI.   Subjective/ 24 H Interval events - stable on the floor overnight, feeling well this morning   Assessment/Plan: Sepsis secondary to UTI  Sepsis criteria met with initial vital signs that included hypertension, low-grade fever in addition to leukocytosis and lactic acidosis patient had evidence of infection based on urinalysis which showed large leukocytes and too numerous to count white blood cells.  Continue IVF until they expire, patient with mild LE edema  Most recent 2-D echo in July 2015 showed ejection fraction of 55-60%.  Continue Vancomycin and Zosyn for 48 hours and transition to oral agents if blood cultures remain negative. Urine culture final today unfortunately could not identify pathogen. BP better, resumed home medications Acute encephalopathy  Likely secondary to urinary tract infection.  Mental status back to baseline Diabetes mellitus type 1, uncontrolled, insulin dependent with complications of neuropathy  Check A1c on this admission. A1c in July 2014 10.4 indicating poor glycemic control.    Continue Lantus 28 units in the morning and 15 units at bedtime in addition to sliding scale Humalog. Add mealtime 3 U given persistent high sugars Polymyalgia rheumatica  On low dose prednisone HTN (hypertension)  Continue Coreg, Lasix, Imdur, Irbesartan CAD with multiple PCIs / Cardiomyopathy, ischemic / History of recent NSTEMI with recent PCI to SVG to RCA (3.0 x 15 Xience expedition DES) in 2013, no further percutaneous options / Chronic diastolic CHF  Stable, no complaints of chest pain.  Resume aspirin and Plavix.  Resume Coreg, Lasix, Imdur, Diovan, Irbesartan  Hip fracture- surgery 09/19/13  Resume home analgesics. Started morphine 2 mg IV every 4 hours when necessary for severe pain. Anxiety and depression  Continue Xanax and Elavil, Zoloft per home regimen Left Upper pole kidney density   Per CT c/w hemorrhagic/proteinaceous cyst, Dr. Charlies Silvers discussed with GU and Cardiology and patient OK to take Aspirin and Plavix.   Patient aware of this lesion and it was noted on CT abdomen as far as 2008  Diet: Carb Modified Fluids: NS 75 cc/h DVT Prophylaxis: Aspirin, Plavix, SCD  Code Status: Full Family Communication: none this morning  Disposition Plan: home tomorrow if stable  Consultants:  None   Procedures:  None    Antibiotics Vancomycin 9/25 >> Zosyn 9/25 >>  Studies  Filed Vitals:   12/07/13 1600 12/07/13 1858 12/07/13 2102 12/08/13 0456  BP: 84/50 96/37 104/45 113/39  Pulse: 70 67 62 67  Temp: 99.6 F (37.6 C) 98.1 F (36.7 C) 98 F (36.7 C) 98.9 F (37.2 C)  TempSrc: Oral Oral Oral Oral  Resp: _0 Height:      Weight:    76.4 kg (168 lb 6.9 oz)  SpO2: 96% 97% 97% 91%    Intake/Output Summary (Last 24 hours) at 12/08/13 0815 Last data filed at 12/08/13 0739  Gross per 24 hour  Intake   1560 ml  Output   2175 ml  Net   -615 ml   Filed Weights   12/06/13 2224 12/07/13 0400 12/08/13 0456  Weight: 74.4 kg (164 lb 0.4 oz) 75.5 kg (166  lb 7.2 oz) 76.4 kg (168 lb 6.9 oz)   Exam:  General:  NAD  Cardiovascular: RRR, no MRG  Respiratory: CTA biL on anterior auscultation  Abdomen: soft, non tender  MSK: 1+ LE edema  Neuro: non focal  Data Reviewed: Basic Metabolic Panel:  Recent Labs Lab 12/06/13 1320 12/06/13 2359 12/07/13 0343 12/08/13 0431  NA 139 134* 137 133*  K 3.6* 5.2 4.4 4.8  CL 96 98 103 99  CO2 _0 GLUCOSE 114* 153* 205* 279*  BUN 25* 27* 24* 20  CREATININE 1.00 0.87 0.87 1.05  CALCIUM 9.3 8.5 8.4 8.3*  MG  --  2.1  --   --   PHOS  --  3.0  --   --    Liver Function Tests:  Recent Labs Lab 12/06/13 1320 12/06/13 2359 12/07/13 0343 12/08/13 0431  AST 21 39* 20 15  ALT _1 ALKPHOS 100 90 84 82  BILITOT 0.3 0.3 0.3 0.3  PROT 7.8 7.2 6.5 6.1  ALBUMIN 3.8 3.2* 3.0* 2.6*   CBC:  Recent Labs Lab 12/06/13 1320 12/06/13 2359 12/07/13 0343 12/08/13 0431  WBC 13.9* TEST RESCHEDULED PER TIFFANY RN 16.2* 9.3  NEUTROABS 10.1* PENDING  --   --   HGB 12.1 TEST RESCHEDULED PER TIFFANY RN 11.4* 9.0*  HCT 36.1 TEST RESCHEDULED PER TIFFANY RN 34.0* 27.1*  MCV 87.6 TEST RESCHEDULED PER TIFFANY RN 88.3 89.1  PLT 280 TEST RESCHEDULED PER TIFFANY RN 326 265   Cardiac Enzymes:  Recent Labs Lab 12/06/13 1320  TROPONINI <0.30   BNP (last 3 results)  Recent Labs  09/18/13 2221  PROBNP 700.3*   CBG:  Recent Labs Lab 12/07/13 0747 12/07/13 1131 12/07/13 1603 12/07/13 2059 12/08/13 0741  GLUCAP 186* 272* 186* 239* 259*    Recent Results (from the past 240 hour(s))  CULTURE, BLOOD (ROUTINE X 2)     Status: None   Collection Time    12/06/13  1:01 PM      Result Value Ref Range Status   Specimen Description BLOOD RIGHT FOREARM   Final   Special Requests BOTTLES DRAWN AEROBIC AND ANAEROBIC 2ML   Final   Culture  Setup Time     Final   Value: 12/06/2013 16:16     Performed at [facility]   Culture     Final   Value:        BLOOD CULTURE RECEIVED  NO GROWTH TO DATE CULTURE WILL BE HELD FOR 5 DAYS BEFORE ISSUING A FINAL NEGATIVE REPORT     Performed at [facility]   Report Status PENDING   Incomplete  CULTURE, BLOOD (ROUTINE X 2)     Status: None   Collection Time    12/06/13  1:20 PM      Result Value Ref Range Status   Specimen Description BLOOD LEFT HAND   Final   Special Requests BOTTLES DRAWN AEROBIC ONLY 2ML   Final   Culture  Setup Time     Final   Value: 12/06/2013 16:16  Performed at [facility]     Final   Value:        BLOOD CULTURE RECEIVED NO GROWTH TO DATE CULTURE WILL BE HELD FOR 5 DAYS BEFORE ISSUING A FINAL NEGATIVE REPORT     Performed at [facility]   Report Status PENDING   Incomplete  URINE CULTURE     Status: None   Collection Time    12/06/13  3:17 PM      Result Value Ref Range Status   Specimen Description URINE, CLEAN CATCH   Final   Special Requests NONE   Final   Culture  Setup Time     Final   Value: 12/07/2013 00:30     Performed at [place]     Final   Value: 50,000 COLONIES/ML     Performed at [facility]   Culture     Final   Value: Multiple bacterial morphotypes present, none predominant. Suggest appropriate recollection if clinically indicated.     Performed at [facility]   Report Status 12/08/2013 FINAL   Final  MRSA PCR SCREENING     Status: None   Collection Time    12/06/13 11:44 PM      Result Value Ref Range Status   MRSA by PCR NEGATIVE  NEGATIVE Final   Comment:            The GeneXpert MRSA Assay (FDA     approved for NASAL specimens     only), is one component of a     comprehensive MRSA colonization     surveillance program. It is not     intended to diagnose MRSA     infection nor to guide or     monitor treatment for     MRSA infections.     Studies: Ct Abdomen Pelvis Wo Contrast 12/06/2013  Moderate amount of retained large bowel stool without bowel obstruction or acute  intra-abdominal/pelvic process.  Density within upper pole LEFT kidney most consistent with hemorrhagic/ proteinaceous cyst.  Dg Chest 2 View 12/06/2013  Prior CABG. Heart is normal size. Linear atelectasis in the left base. Right lung is clear. No effusions. No acute bony abnormality.  Scheduled Meds: . amitriptyline  50 mg Oral BID  . aspirin EC  162 mg Oral BID  . calcium-vitamin D  1 tablet Oral Q breakfast  . carvedilol  25 mg Oral BID WC  . clopidogrel  75 mg Oral Q breakfast  . fentaNYL  25 mcg Transdermal Q72H  . folic acid  1.5 mg Oral Daily  . furosemide  20 mg Oral Q48H  . furosemide  40 mg Oral Daily  . insulin aspart  0-20 Units Subcutaneous TID WC  . insulin aspart  0-5 Units Subcutaneous QHS  . insulin glargine  15 Units Subcutaneous QHS  . insulin glargine  28 Units Subcutaneous Daily  . irbesartan  75 mg Oral Daily  . iron polysaccharides  150 mg Oral Daily  . isosorbide mononitrate  60 mg Oral BID  . latanoprost  1 drop Both Eyes QHS  . multivitamin  1 tablet Oral Daily  . pantoprazole  40 mg Oral Daily  . piperacillin-tazobactam (ZOSYN)  IV  3.375 g Intravenous Q8H  . polyethylene glycol  17 g Oral BID  . potassium chloride SA  30 mEq Oral Daily  . predniSONE  2.5 mg Oral Q breakfast  . vitamin B-6  25 mg Oral Daily  . senna-docusate  1 tablet Oral BID  . sertraline  50 mg Oral Daily  . sodium chloride  3 mL Intravenous Q12H  . timolol  1 drop Both Eyes Daily  . vancomycin  750 mg Intravenous Q12H  . vitamin B-12  100 mcg Oral Daily  . vitamin E  400 Units Oral Daily  . zinc sulfate  220 mg Oral Daily   Continuous Infusions: . sodium chloride 10 mL/hr (12/07/13 1344)    Principal Problem:   Sepsis Active Problems:   Diabetes mellitus type 1, uncontrolled, insulin dependent   Polymyalgia rheumatica   HTN (hypertension)   CAD- multiple PCIs   Peripheral autonomic neuropathy due to diabetes mellitus   Hip fracture- surgery 09/19/13   Anxiety    Cardiomyopathy, ischemic- EF 35-40% at cath Oct 2014   UTI (urinary tract infection)   Hypokalemia  Time spent: Level Green, MD [facility] Pager [phone]. If 7 PM - 7 AM, please contact night-coverage at [email or web address], password [facility] 12/08/2013, 8:15 AM  LOS: 2 days

## 2013-12-08 NOTE — Evaluation (Signed)
Physical Therapy Evaluation Patient Details Name: VERNE COVE MRN: [id number] DOB: 1950/09/18 Today's Date: 12/08/2013   History of Present Illness  63 -year-old female with PMHx of CAD and quite extensive coronary angiographies, recent hip surgery for hip fracture, has developed delirium after the procedure, has suffered a non-STEMI during that hospitalization but because of her significant coronary disease cardiac cath was not recommended (hospitalization in 09/2013), history of polymyalgia rheumatic on low dose prednisone, anxiety and depression, diabetes, dyslipidemia and admitted 12/06/2013 with altered mental status and found to have sepsis in the setting of a UTI.   Clinical Impression  Pt currently with functional limitations due to the deficits listed below (see PT Problem List).  Pt will benefit from skilled PT to increase their independence and safety with mobility to allow discharge to the venue listed below.  Pt very HOH so difficult to get home living set up however reports her friends are at the beach and won't be back until Tuesday which is when she hopes to be discharged so she will have assist at home.  Recommend at least initial supervision with mobility and HHPT as pt still recovering and receiving therapy from recent hip surgery due to fall resulting in hip fracture.     Follow Up Recommendations Home health PT;Supervision for mobility/OOB    Equipment Recommendations  None recommended by PT    Recommendations for Other Services       Precautions / Restrictions Precautions Precautions: Fall Restrictions Other Position/Activity Restrictions: pt states WBAT after recent R hip surgery      Mobility  Bed Mobility Overal bed mobility: Needs Assistance Bed Mobility: Sit to Supine       Sit to supine: Min assist   General bed mobility comments: assist for LEs onto bed due to fatigue  Transfers Overall transfer level: Needs assistance Equipment used: Rolling walker  (2 wheeled) Transfers: Sit to/from Stand Sit to Stand: Min guard         General transfer comment: good hand placement, min/guard for safety, slight dizziness reported upon standing  Ambulation/Gait Ambulation/Gait assistance: Min guard Ambulation Distance (Feet): 60 Feet Assistive device: Rolling walker (2 wheeled) Gait Pattern/deviations: Antalgic;Decreased stance time - right Gait velocity: decr   General Gait Details: antalgic gait observed s/p R hip surgery a couple months ago, fatigued quickly  Stairs            Wheelchair Mobility    Modified Rankin (Stroke Patients Only)       Balance                                             Pertinent Vitals/Pain Pain Assessment: No/denies pain    Home Living Family/patient expects to be discharged to:: Private residence Living Arrangements: Non-relatives/Friends Available Help at Discharge: Friend(s) Type of Home: House Home Access: Stairs to enter Entrance Stairs-Rails: Right Entrance Stairs-Number of Steps: 3 Home Layout: One level Home Equipment: Environmental consultant - 2 wheels;Cane - single point      Prior Function Level of Independence: Independent with assistive device(s)         Comments: states she was doing well s/p R hip surgery with RW and receiving HHPT     Hand Dominance        Extremity/Trunk Assessment               Lower Extremity Assessment: Generalized  weakness;RLE deficits/detail RLE Deficits / Details: able to perform active LE movement however slow and cautious       Communication   Communication: HOH  Cognition Arousal/Alertness: Awake/alert Behavior During Therapy: WFL for tasks assessed/performed Overall Cognitive Status: Within Functional Limits for tasks assessed                      General Comments      Exercises        Assessment/Plan    PT Assessment Patient needs continued PT services  PT Diagnosis Difficulty walking;Generalized  weakness   PT Problem List Decreased strength;Decreased balance;Decreased activity tolerance;Decreased mobility  PT Treatment Interventions DME instruction;Gait training;Functional mobility training;Therapeutic activities;Therapeutic exercise;Patient/family education   PT Goals (Current goals can be found in the Care Plan section) Acute Rehab PT Goals PT Goal Formulation: With patient Time For Goal Achievement: 12/08/13 Potential to Achieve Goals: Good    Frequency Min 3X/week   Barriers to discharge        Co-evaluation               End of Session Equipment Utilized During Treatment: Gait belt Activity Tolerance: Patient limited by fatigue Patient left: in bed;with call bell/phone within reach           Time: 1301-1313 PT Time Calculation (min): 12 min   Charges:   PT Evaluation $Initial PT Evaluation Tier I: 1 Procedure PT Treatments $Gait Training: 8-22 mins   PT G Codes:          Jasime Westergren,KATHrine E 12/08/2013, 2:06 PM Zenovia Jarred, PT, DPT 12/08/2013 Pager: [phone]

## 2013-12-09 LAB — CBC
HCT: 26.1 % — ABNORMAL LOW (ref 36.0–46.0)
Hemoglobin: 8.8 g/dL — ABNORMAL LOW (ref 12.0–15.0)
MCH: 29.5 pg (ref 26.0–34.0)
MCHC: 33.7 g/dL (ref 30.0–36.0)
MCV: 87.6 fL (ref 78.0–100.0)
Platelets: 271 10*3/uL (ref 150–400)
RBC: 2.98 MIL/uL — ABNORMAL LOW (ref 3.87–5.11)
RDW: 13.1 % (ref 11.5–15.5)
WBC: 8.3 10*3/uL (ref 4.0–10.5)

## 2013-12-09 LAB — GLUCOSE, CAPILLARY: Glucose-Capillary: 163 mg/dL — ABNORMAL HIGH (ref 70–99)

## 2013-12-09 MED ORDER — CIPROFLOXACIN HCL 500 MG PO TABS
500.0000 mg | ORAL_TABLET | Freq: Two times a day (BID) | ORAL | Status: DC
  Administered 2013-12-09: 500 mg via ORAL
  Filled 2013-12-09: qty 1

## 2013-12-09 MED ORDER — CIPROFLOXACIN HCL 500 MG PO TABS: 500.0000 mg | ORAL_TABLET | Freq: Two times a day (BID) | ORAL | Status: AC

## 2013-12-09 NOTE — Care Management Note (Signed)
    Page 1 of 1   12/09/2013     11:22:29 AM CARE MANAGEMENT NOTE 12/09/2013  Patient:  MUSETTE, KISAMORE A   Account Number:  [id number]  Date Initiated:  12/09/2013  Documentation initiated by:  Lanier Clam  Subjective/Objective Assessment:   63 Y/O F ADMITTED W/SEPSIS.     Action/Plan:   FROM HOME.ACTIVE W/[facility].   Anticipated DC Date:  12/09/2013   Anticipated DC Plan:  HOME W HOME HEALTH SERVICES      DC Planning Services  CM consult      [facility] Choice  Resumption Of Svcs/PTA Provider   Choice offered to / List presented to:  C-1 Patient        HH arranged  HH-2 PT      [facility] agency  [facility]   Status of service:  Completed, signed off Medicare Important Message given?  YES (If response is "NO", the following Medicare IM given date fields will be blank) Date Medicare IM given:  12/09/2013 Medicare IM given by:  [facility] Date Additional Medicare IM given:   Additional Medicare IM given by:    Discharge Disposition:  HOME W HOME HEALTH SERVICES  Per UR Regulation:  Reviewed for med. necessity/level of care/duration of stay  If discussed at Long Length of Stay Meetings, dates discussed:    Comments:  12/09/13 Aquiles Ruffini RN,BSN NCM [phone] PT-HH.RECIEVED HHPT ORDER.CINFIRMED G[facility]A ALREADY ACTIVE.TC [phone] INFORMED MANDY OF RESUMPTION OF HHPT,SHE WILL INFORM ONSITE REPS-MARY/JERRY(THEY WILL GET INFO FROM [employer]).PATIWNT STATES SHE HAS FRIEND WHO WILL PICK HER UP TO TAKE HOME.NO FURTHER D/C NEEDS.

## 2013-12-09 NOTE — Progress Notes (Signed)
CSW received referral that pt requesting ambulance transport home.  CSW reviewed chart and met with pt at bedside. CSW explored with pt regarding transportation home. Pt shared that her friend/roommate is currently at the beach and cannot provide her transportation. CSW explored with pt if there were any other friends/family members that could assist. CSW discussed with pt that CSW can arrange ambulance transport, but there is no guarantee that insurance will cover ambulance transport. Pt shared that she does not wish to have ambulance transport as she already has a lot of medical bills and does not want to add to the medical bills for the ambulance transport. Pt reports that she has a neighbor that she feels that can assist her with transport home. CSW offered to assist pt in making phone call, but pt states that she is able to call neighbor and plans to call neighbor to arrange transport.  CSW notified RN.   No further social work needs identified at this time.  CSW signing off.   [name], MSW, [place] [facility] [phone]

## 2013-12-09 NOTE — Clinical Documentation Improvement (Signed)
H&P notes pt "admitted to step down unit because of hypotension". 0.9% NS boluses of 538ml and 1025ml ordered day of admission.  Later in H&P under 'assessment/plan" section and progress notes state  "sepsis criteria met with initial vitals signs that included hypertension, low-grade fever, ...leukocytosis and lactic acidosis...".  Please clarify if hypotension or hypertension as part of sepsis criteria.  Thank you, Mateo Flow, RN [phone] Clinical Documentation Specialist

## 2013-12-09 NOTE — Discharge Instructions (Signed)

## 2013-12-09 NOTE — Discharge Summary (Signed)
Physician Discharge Summary  [name] YIF:[id number] DOB: 10-04-1950 DOA: 12/06/2013  PCP: Alonza Bogus, MD  Admit date: 12/06/2013 Discharge date: 12/09/2013  Time spent: 40 minutes  Recommendations for Outpatient Follow-up:  1. Follow up with Dr. Luan Pulling in 2 days. Patient already has an appointment.   Recommendations for primary care physician for things to follow:  Repeat CBC, CMP  Discharge Diagnoses:  Principal Problem:   Sepsis Active Problems:   Diabetes mellitus type 1, uncontrolled, insulin dependent   Polymyalgia rheumatica   HTN (hypertension)   CAD- multiple PCIs   Peripheral autonomic neuropathy due to diabetes mellitus   Hip fracture- surgery 09/19/13   Anxiety   Cardiomyopathy, ischemic- EF 35-40% at cath Oct 2014   UTI (urinary tract infection)   Hypokalemia  Discharge Condition: stable  Diet recommendation: heart healthy   Filed Weights   12/07/13 0400 12/08/13 0456 12/09/13 0442  Weight: 75.5 kg (166 lb 7.2 oz) 76.4 kg (168 lb 6.9 oz) 76.3 kg (168 lb 3.4 oz)    History of present illness:  63 -year-old female with multiple medical comorbidities including CAD and quite extensive coronary angiographies (most recently seen in Dr. Lysbeth Penner office who said she is not a candidate for repeat cardiac surgery or intervention and at that time she was placed on Duragesic patch), recent hip surgery for hip fracture, has developed delirium after the procedure, has suffered a non-STEMI during that hospitalization but because of her significant coronary disease cardiac cath was not recommended (hospitalization in 09/2013), history of polymyalgia rheumatic on low dose prednisone, anxiety and depression, diabetes, dyslipidemia who presented to [facility] ED 12/06/2013 with altered mental status on the morning prior to this admission per family. Patient's family is not at the bedside to provide the details of the history but patient is back at her baseline mental status, oriented  to time, place and person. Per the earlier report in ED apparently patient was found to be hypoglycemic at home with CBGs in 40s and with orange juice her mental status has improved. No complaints of chest pain or shortness of breath or palpitations. Patient complained of epigastric abdominal pain in ED but her pain is now resolved. Pt had some nausea and vomiting but now feels better. No lightheadedness or loss of consciousness. No reports of blood in the stool or urine. In ED, blood pressure was 85/30, heart rate 72, T max 100 F and oxygen saturation 89 - 100%. On 2 L nasal cannula oxygen support. Blood work revealed leukocytosis of 13.9 and potassium of 3.6. Urinalysis showed large leukocytes and too numerous to count white blood cells. Lactic acid was 2.21. The troponin level was within normal limits. CT abdomen showed moderate amount of retained large bowel stool without bowel obstruction or acute intra-abdominal process. Also seen was a density within the left upper pole of the kidney consistent with hemorrhagic/proteinaceous cysts. I spoke with GU as well as cardiology to clarify if patient can take aspirin and Plavix because apparently earlier today in ED patient was not given dose of medications and she was adamant about having them since she did not take them this am. Per GU and cardio pt ok to take aspirin and plavix. Further imaging study included chest x-ray which showed left basilar atelectasis. Patient was started on broad-spectrum antibiotics vancomycin and Zosyn for treatment of sepsis likely secondary to urinary tract infection. She was admitted to step down unit because of hypotension.  Hospital Course:  Sepsis secondary to UTI  Sepsis criteria met with initial vital signs that included hypertension, low-grade fever in addition to leukocytosis and lactic acidosis patient had evidence of infection based on urinalysis which showed large leukocytes and too numerous to count white blood cells.    Patient was started on broad spectrum antibiotics Vancomycin and Zosyn and cultures were obtained. She improved quite rapidly and after a night in SDU she was transferred to floor where she has remained clinically stable. Her cultures including urine culture remained negative and her antibiotics were narrowed to Ciprofloxacin to cover her potential UTI and she is to finish a course as an outpatient.  Acute encephalopathy  Likely secondary to urinary tract infection.  Mental status back to baseline after initial antibiotics Diabetes mellitus type 1, uncontrolled, insulin dependent with complications of neuropathy  A1c on this admission 10.1 indicating poor glycemic control.  Continue Lantus 28 units in the morning and 15 units at bedtime in addition to sliding scale Humalog. These can be further titrated as an outpatient.  Polymyalgia rheumatica  On low dose prednisone HTN (hypertension)  Continue home regimen  CAD with multiple PCIs / Cardiomyopathy, ischemic / History of recent NSTEMI with recent PCI to SVG to RCA (3.0 x 15 Xience expedition DES) in 2013, no further percutaneous options / Chronic diastolic CHF  Stable, no complaints of chest pain.  Hip fracture- surgery 09/19/13 Anxiety and depression  Continue Xanax and Elavil, Zoloft per home regimen Left Upper pole kidney density  Per CT c/w hemorrhagic/proteinaceous cyst, Dr. Charlies Silvers discussed with GU and Cardiology and patient OK to take Aspirin and Plavix.  Patient aware of this lesion and it was noted on CT abdomen as far as 2008  Procedures:  None    Consultations:  None   Discharge Exam: Filed Vitals:   12/08/13 0456 12/08/13 1401 12/08/13 2120 12/09/13 0442  BP: 113/39 105/40 140/46 125/45  Pulse: 67 72 74 76  Temp: 98.9 F (37.2 C) 98.8 F (37.1 C) 98.8 F (37.1 C) 98.2 F (36.8 C)  TempSrc: Oral Oral Oral Oral  Resp: $Remo'15 16 18 16  'dXseU$ Height:      Weight: 76.4 kg (168 lb 6.9 oz)   76.3 kg (168 lb 3.4 oz)  SpO2: 91%  97% 99% 94%    General: NAD Cardiovascular: RRR Respiratory: CTA biL  Discharge Instructions     Medication List         ALPRAZolam 1 MG tablet  Commonly known as:  XANAX  Take 1 mg by mouth every 6 (six) hours as needed for anxiety (anxiety).     amitriptyline 50 MG tablet  Commonly known as:  ELAVIL  Take 50 mg by mouth 2 (two) times daily.     aspirin EC 81 MG tablet  Take 162 mg by mouth 2 (two) times daily.     Calcium Carb-Cholecalciferol 600-500 MG-UNIT Caps  Take 1 capsule by mouth daily.     carvedilol 25 MG tablet  Commonly known as:  COREG  Take 25 mg by mouth 2 (two) times daily with a meal.     ciprofloxacin 500 MG tablet  Commonly known as:  CIPRO  Take 1 tablet (500 mg total) by mouth 2 (two) times daily.     clopidogrel 75 MG tablet  Commonly known as:  PLAVIX  Take 1 tablet (75 mg total) by mouth daily.     COD LIVER OIL PO  Take 1 capsule by mouth daily.     docusate sodium 100 MG capsule  Commonly known as:  COLACE  Take 1 capsule (100 mg total) by mouth 2 (two) times daily.     fentaNYL 25 MCG/HR patch  Commonly known as:  DURAGESIC - dosed mcg/hr  Place 1 patch (25 mcg total) onto the skin every 3 (three) days.     folic acid 702 MCG tablet  Commonly known as:  FOLVITE  Take 1,600 mcg by mouth daily.     furosemide 40 MG tablet  Commonly known as:  LASIX  Take 40-60 mg by mouth See admin instructions. Take 40 mg every  morning.  Take 20 mg every other evening.     insulin glargine 100 UNIT/ML injection  Commonly known as:  LANTUS  Inject 0.28 mLs (28 Units total) into the skin daily.     insulin glargine 100 UNIT/ML injection  Commonly known as:  LANTUS  Inject 0.15 mLs (15 Units total) into the skin at bedtime.     insulin lispro 100 UNIT/ML injection  Commonly known as:  HUMALOG  Inject 4-12 Units into the skin 3 (three) times daily before meals.     iron polysaccharides 150 MG capsule  Commonly known as:  NU-IRON  Take 1  capsule (150 mg total) by mouth daily.     isosorbide mononitrate 60 MG 24 hr tablet  Commonly known as:  IMDUR  Take 1 tablet (60 mg total) by mouth 2 (two) times daily.     Lutein 20 MG Caps  Take 1 capsule by mouth daily.     nitroGLYCERIN 0.4 MG SL tablet  Commonly known as:  NITROSTAT  Place 0.4 mg under the tongue every 5 (five) minutes as needed for chest pain (chest pain).     omeprazole 20 MG capsule  Commonly known as:  PRILOSEC  Take 20 mg by mouth daily.     oxyCODONE-acetaminophen 10-325 MG per tablet  Commonly known as:  PERCOCET  Take 1 tablet by mouth every 4 (four) hours as needed for pain (pain).     potassium chloride SA 20 MEQ tablet  Commonly known as:  K-DUR,KLOR-CON  Take 30 mEq by mouth daily.     predniSONE 2.5 MG tablet  Commonly known as:  DELTASONE  Take 2.5 mg by mouth daily with breakfast.     sertraline 50 MG tablet  Commonly known as:  ZOLOFT  Take 50 mg by mouth daily.     timolol 0.25 % ophthalmic solution  Commonly known as:  TIMOPTIC  Place 1 drop into both eyes daily.     TRAVATAN Z 0.004 % Soln ophthalmic solution  Generic drug:  Travoprost (BAK Free)  Place 1 drop into both eyes at bedtime.     valsartan 40 MG tablet  Commonly known as:  DIOVAN  Take 40 mg by mouth daily.     VITAMIN B-12 PO  Take 1 tablet by mouth daily.     VITAMIN B-6 PO  Take 1 tablet by mouth daily.     vitamin E 400 UNIT capsule  Take 400 Units by mouth daily.     zinc gluconate 50 MG tablet  Take 50 mg by mouth daily.           Follow-up Information   Follow up with HAWKINS,EDWARD L, MD. Schedule an appointment as soon as possible for a visit in 1 week.   Specialty:  Pulmonary Disease   Contact information:   [place]  [phone]  The results of significant diagnostics from this hospitalization (including imaging, microbiology, ancillary and laboratory) are listed below for reference.     Significant Diagnostic Studies: Ct Abdomen Pelvis Wo Contrast  12/06/2013   CLINICAL DATA:  Lower pelvic pain with nausea, vomiting and fever.  EXAM: CT ABDOMEN AND PELVIS WITHOUT CONTRAST  TECHNIQUE: Multidetector CT imaging of the abdomen and pelvis was performed following the standard protocol without IV contrast.  COMPARISON:  CT of the abdomen and pelvis October 09, 2012  FINDINGS: LUNG BASES: Apparent vascular congestion, dependent atelectasis without pleural effusions. Status post median sternotomy. Heart size appears mildly enlarged, included pericardium is unremarkable. Similar tiny calcifications in RIGHT breast.  KIDNEYS/BLADDER: Kidneys are orthotopic, demonstrating normal size and morphology. No nephrolithiasis, hydronephrosis; limited assessment for renal masses on this nonenhanced examination. Faint 15 mm density in LEFT upper pole of the kidney corresponds to cyst present on prior examination. The unopacified ureters are normal in course and caliber. Urinary bladder is partially distended and unremarkable.  SOLID ORGANS: The liver, spleen, gallbladder, pancreas and adrenal glands are unremarkable for this non-contrast examination.  GASTROINTESTINAL TRACT: The stomach, small and large bowel are normal in course and caliber without inflammatory changes, the sensitivity may be decreased by lack of enteric contrast. Moderate amount of retained large bowel stool. Normal appendix.  PERITONEUM/RETROPERITONEUM: No intraperitoneal free fluid nor free air. Aortoiliac vessels are normal in course and caliber, moderate to severe. No lymphadenopathy by CT size criteria. Internal reproductive organs are unremarkable.  SOFT TISSUES/ OSSEOUS STRUCTURES: Nonsuspicious. Status post RIGHT femoral neck pinning. Stable soft tissue masses within the anterior abdominal wall subcutaneous fat may reflect injection sites, unchanged.  IMPRESSION: Moderate amount of retained large bowel stool without bowel obstruction or  acute intra-abdominal/pelvic process.  Density within upper pole LEFT kidney most consistent with hemorrhagic/ proteinaceous cyst.   Electronically Signed   By: Elon Alas   On: 12/06/2013 19:35   Dg Chest 2 View  12/06/2013   CLINICAL DATA:  Nausea, vomiting. History of diabetes and hypertension.  EXAM: CHEST  2 VIEW  COMPARISON:  09/25/2013  FINDINGS: Prior CABG. Heart is normal size. Linear atelectasis in the left base. Right lung is clear. No effusions. No acute bony abnormality.  IMPRESSION: Left basilar atelectasis.   Electronically Signed   By: Rolm Baptise M.D.   On: 12/06/2013 12:56    Microbiology: Recent Results (from the past 240 hour(s))  CULTURE, BLOOD (ROUTINE X 2)     Status: None   Collection Time    12/06/13  1:01 PM      Result Value Ref Range Status   Specimen Description BLOOD RIGHT FOREARM   Final   Special Requests BOTTLES DRAWN AEROBIC AND ANAEROBIC 2ML   Final   Culture  Setup Time     Final   Value: 12/06/2013 16:16     Performed at [facility]   Culture     Final   Value:        BLOOD CULTURE RECEIVED NO GROWTH TO DATE CULTURE WILL BE HELD FOR 5 DAYS BEFORE ISSUING A FINAL NEGATIVE REPORT     Performed at [facility]   Report Status PENDING   Incomplete  CULTURE, BLOOD (ROUTINE X 2)     Status: None   Collection Time    12/06/13  1:20 PM      Result Value Ref Range Status   Specimen Description BLOOD LEFT HAND   Final   Special Requests BOTTLES  DRAWN AEROBIC ONLY 2ML   Final   Culture  Setup Time     Final   Value: 12/06/2013 16:16     Performed at [facility]   Culture     Final   Value:        BLOOD CULTURE RECEIVED NO GROWTH TO DATE CULTURE WILL BE HELD FOR 5 DAYS BEFORE ISSUING A FINAL NEGATIVE REPORT     Performed at [facility]   Report Status PENDING   Incomplete  URINE CULTURE     Status: None   Collection Time    12/06/13  3:17 PM      Result Value Ref Range Status   Specimen Description URINE, CLEAN  CATCH   Final   Special Requests NONE   Final   Culture  Setup Time     Final   Value: 12/07/2013 00:30     Performed at [place]     Final   Value: 50,000 COLONIES/ML     Performed at [facility]   Culture     Final   Value: Multiple bacterial morphotypes present, none predominant. Suggest appropriate recollection if clinically indicated.     Performed at [facility]   Report Status 12/08/2013 FINAL   Final  MRSA PCR SCREENING     Status: None   Collection Time    12/06/13 11:44 PM      Result Value Ref Range Status   MRSA by PCR NEGATIVE  NEGATIVE Final   Comment:            The GeneXpert MRSA Assay (FDA     approved for NASAL specimens     only), is one component of a     comprehensive MRSA colonization     surveillance program. It is not     intended to diagnose MRSA     infection nor to guide or     monitor treatment for     MRSA infections.     Labs: Basic Metabolic Panel:  Recent Labs Lab 12/06/13 1320 12/06/13 2359 12/07/13 0343 12/08/13 0431  NA 139 134* 137 133*  K 3.6* 5.2 4.4 4.8  CL 96 98 103 99  CO2 $Re'28 23 24 24  'Mhe$ GLUCOSE 114* 153* 205* 279*  BUN 25* 27* 24* 20  CREATININE 1.00 0.87 0.87 1.05  CALCIUM 9.3 8.5 8.4 8.3*  MG  --  2.1  --   --   PHOS  --  3.0  --   --    Liver Function Tests:  Recent Labs Lab 12/06/13 1320 12/06/13 2359 12/07/13 0343 12/08/13 0431  AST 21 39* 20 15  ALT $Re'17 19 14 13  'pTm$ ALKPHOS 100 90 84 82  BILITOT 0.3 0.3 0.3 0.3  PROT 7.8 7.2 6.5 6.1  ALBUMIN 3.8 3.2* 3.0* 2.6*   CBC:  Recent Labs Lab 12/06/13 1320 12/06/13 2359 12/07/13 0343 12/08/13 0431 12/09/13 0427  WBC 13.9* TEST RESCHEDULED PER TIFFANY RN 16.2* 9.3 8.3  NEUTROABS 10.1* PENDING  --   --   --   HGB 12.1 TEST RESCHEDULED PER TIFFANY RN 11.4* 9.0* 8.8*  HCT 36.1 TEST RESCHEDULED PER TIFFANY RN 34.0* 27.1* 26.1*  MCV 87.6 TEST RESCHEDULED PER TIFFANY RN 88.3 89.1 87.6  PLT 280 TEST RESCHEDULED PER  TIFFANY RN 326 265 271   Cardiac Enzymes:  Recent Labs Lab 12/06/13 1320  TROPONINI <0.30   BNP: BNP (last 3 results)  Recent  Labs  09/18/13 2221  PROBNP 700.3*   CBG:  Recent Labs Lab 12/08/13 1136 12/08/13 1634 12/08/13 1726 12/08/13 2136 12/09/13 0721  GLUCAP 286* 61* 114* 191* 163*   Signed:  Mikayla Chiusano  [facility] 12/09/2013, 1:27 PM

## 2013-12-11 NOTE — Progress Notes (Signed)
Pt's caregiver Drinda ButtsMarty Heim contacted the floor this am with complaints on pt being discharged to home. The caregiver did not feel patient was ready for d/c. Electronics engineerContacted Business services Manager over hospitalist group, Ro Gentry FitzBingham. She states she will review pt's chart and give [name] a call to discuss her concerns.   Arta BruceDeutsch, Kaliya Shreiner [facility] 12/11/2013

## 2013-12-12 ENCOUNTER — Telehealth: Payer: Self-pay | Admitting: Internal Medicine

## 2013-12-12 ENCOUNTER — Telehealth: Payer: Self-pay | Admitting: *Deleted

## 2013-12-12 LAB — CULTURE, BLOOD (ROUTINE X 2)
CULTURE: NO GROWTH
Culture: NO GROWTH

## 2013-12-12 NOTE — Progress Notes (Signed)
12/06/13:  Angiocath insertion Performed by: Raeford RazorKOHUT, Jojo Geving  Consent: Verbal consent obtained. Risks and benefits: risks, benefits and alternatives were discussed Time out: Immediately prior to procedure a "time out" was called to verify the correct patient, procedure, equipment, support staff and site/side marked as required.  Preparation: Patient was prepped and draped in the usual sterile fashion.  Vein Location: L AC  Ultrasound Guided  Gauge: 20 g  Normal blood return and flush without difficulty  Patient tolerance: Patient tolerated the procedure well with no immediate complications.

## 2013-12-12 NOTE — Telephone Encounter (Signed)
Arline AspCindy called in stating that Ms. Erika OrleansBush is being discharged from the hospital today and her family is going to want an order put in for resumption of nursing service. Arline AspCindy said that order can be faxed to the office at [phone].   Thanks

## 2013-12-12 NOTE — Telephone Encounter (Signed)
Pt. Needs order for home nursing service, pt. Was discharged from hospital today

## 2013-12-12 NOTE — Telephone Encounter (Signed)
Faxed signed plan of treatment to [facility]

## 2013-12-13 ENCOUNTER — Other Ambulatory Visit: Payer: Self-pay | Admitting: *Deleted

## 2013-12-13 NOTE — Telephone Encounter (Signed)
Go ahead and order it.  Dr. HRexene Edison

## 2013-12-16 ENCOUNTER — Other Ambulatory Visit: Payer: Self-pay | Admitting: *Deleted

## 2013-12-16 MED ORDER — CLOPIDOGREL BISULFATE 75 MG PO TABS: 75.0000 mg | ORAL_TABLET | Freq: Every day | ORAL | Status: AC

## 2013-12-16 NOTE — Telephone Encounter (Signed)
Rx was sent to pharmacy electronically. 

## 2013-12-19 NOTE — Telephone Encounter (Signed)
I talked with [name] whom is the pt.s mate and she stated Dr. Juanetta GoslingHawkins office is taking care of it

## 2013-12-27 ENCOUNTER — Other Ambulatory Visit: Payer: Self-pay

## 2014-01-03 ENCOUNTER — Telehealth: Payer: Self-pay | Admitting: Internal Medicine

## 2014-01-03 NOTE — Telephone Encounter (Signed)
[facility].She stated she wanted Dr.Hilty to know the past 3 visits patient's B/P has been low.Ranging 98/68,92/62,90/60.Pulse 70 to 80 bpm.Stated patient complains of dizziness, but she has always complained of dizziness.Message sent to Dr.Hilty for advice.

## 2014-01-07 NOTE — Telephone Encounter (Signed)
[facility] advised to decrease diovan to 20 mg daily.Advised to continue to monitor B/P and call back if B/P does not improve.

## 2014-01-07 NOTE — Telephone Encounter (Signed)
She was just admitted for sepsis - low BP has not completely recovered. I would advise decreasing her diovan from 40 mg daily to 20 mg daily (1/2 tablet).  Dr. HRexene Edison

## 2014-01-08 ENCOUNTER — Encounter: Payer: Self-pay | Admitting: Internal Medicine

## 2014-01-12 DEATH — deceased

## 2014-02-20 ENCOUNTER — Encounter (HOSPITAL_COMMUNITY): Payer: Self-pay | Admitting: Cardiovascular Disease

## 2014-05-01 ENCOUNTER — Telehealth (HOSPITAL_COMMUNITY): Payer: Self-pay

## 2014-09-26 ENCOUNTER — Encounter: Payer: Self-pay | Admitting: Internal Medicine

## 2014-09-26 ENCOUNTER — Encounter: Payer: Self-pay | Admitting: Cardiovascular Disease

## 2015-09-29 IMAGING — CR DG CHEST 1V PORT
1 series · 1 of 1 positions shown · non-contrast
Comparison: 01/07/2013

CLINICAL DATA: Preop hip fracture.  Diabetes.  Hypertension.

EXAM:
PORTABLE CHEST - 1 VIEW

[AP]
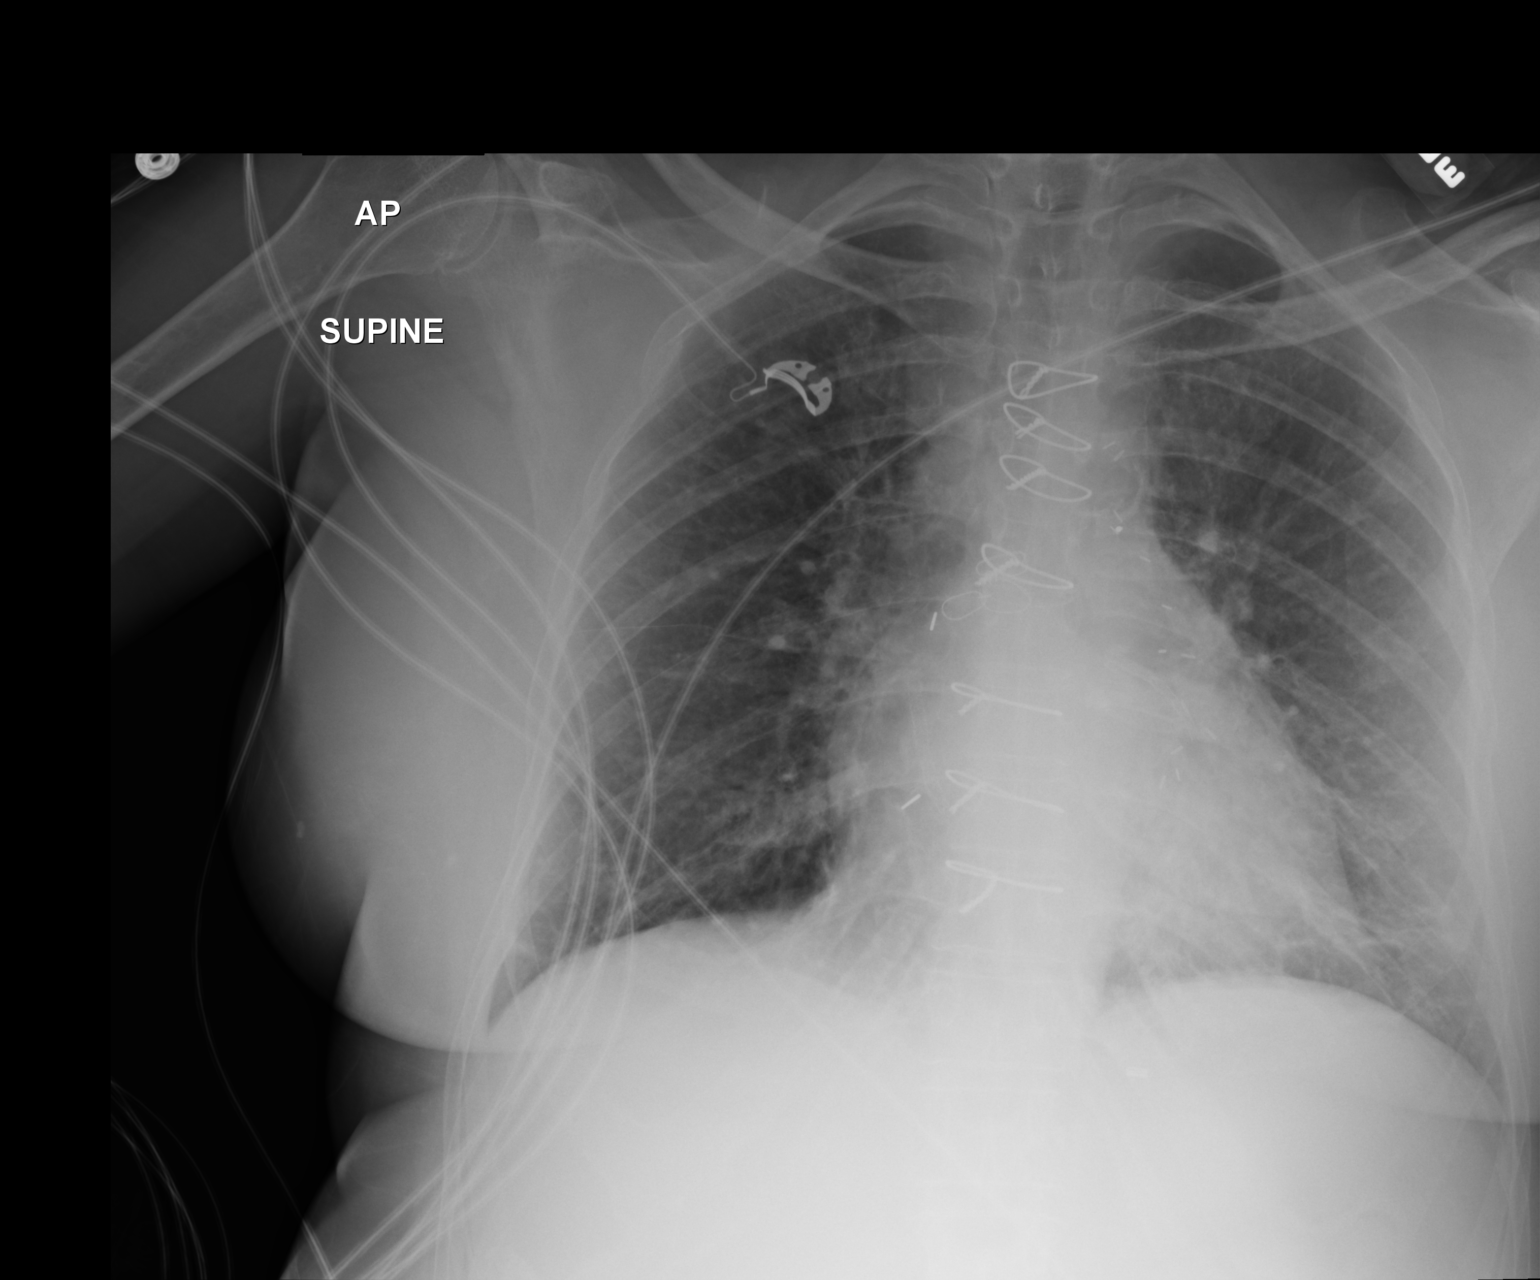

[1 of 1 positions shown; findings below may reference images not displayed]

FINDINGS: Postoperative changes in the mediastinum. Normal heart size and
pulmonary vascularity. Interstitial changes in the lungs probably
representing fibrosis, similar to previous study no focal airspace
disease. No blunting of costophrenic angles. No pneumothorax.
IMPRESSION: No active disease.

## 2015-09-30 IMAGING — CR DG PORTABLE PELVIS
1 series · 1 of 1 positions shown · non-contrast
Comparison: None.

CLINICAL DATA: Postop right hip.

EXAM:
PORTABLE PELVIS 1-2 VIEWS

[AP]
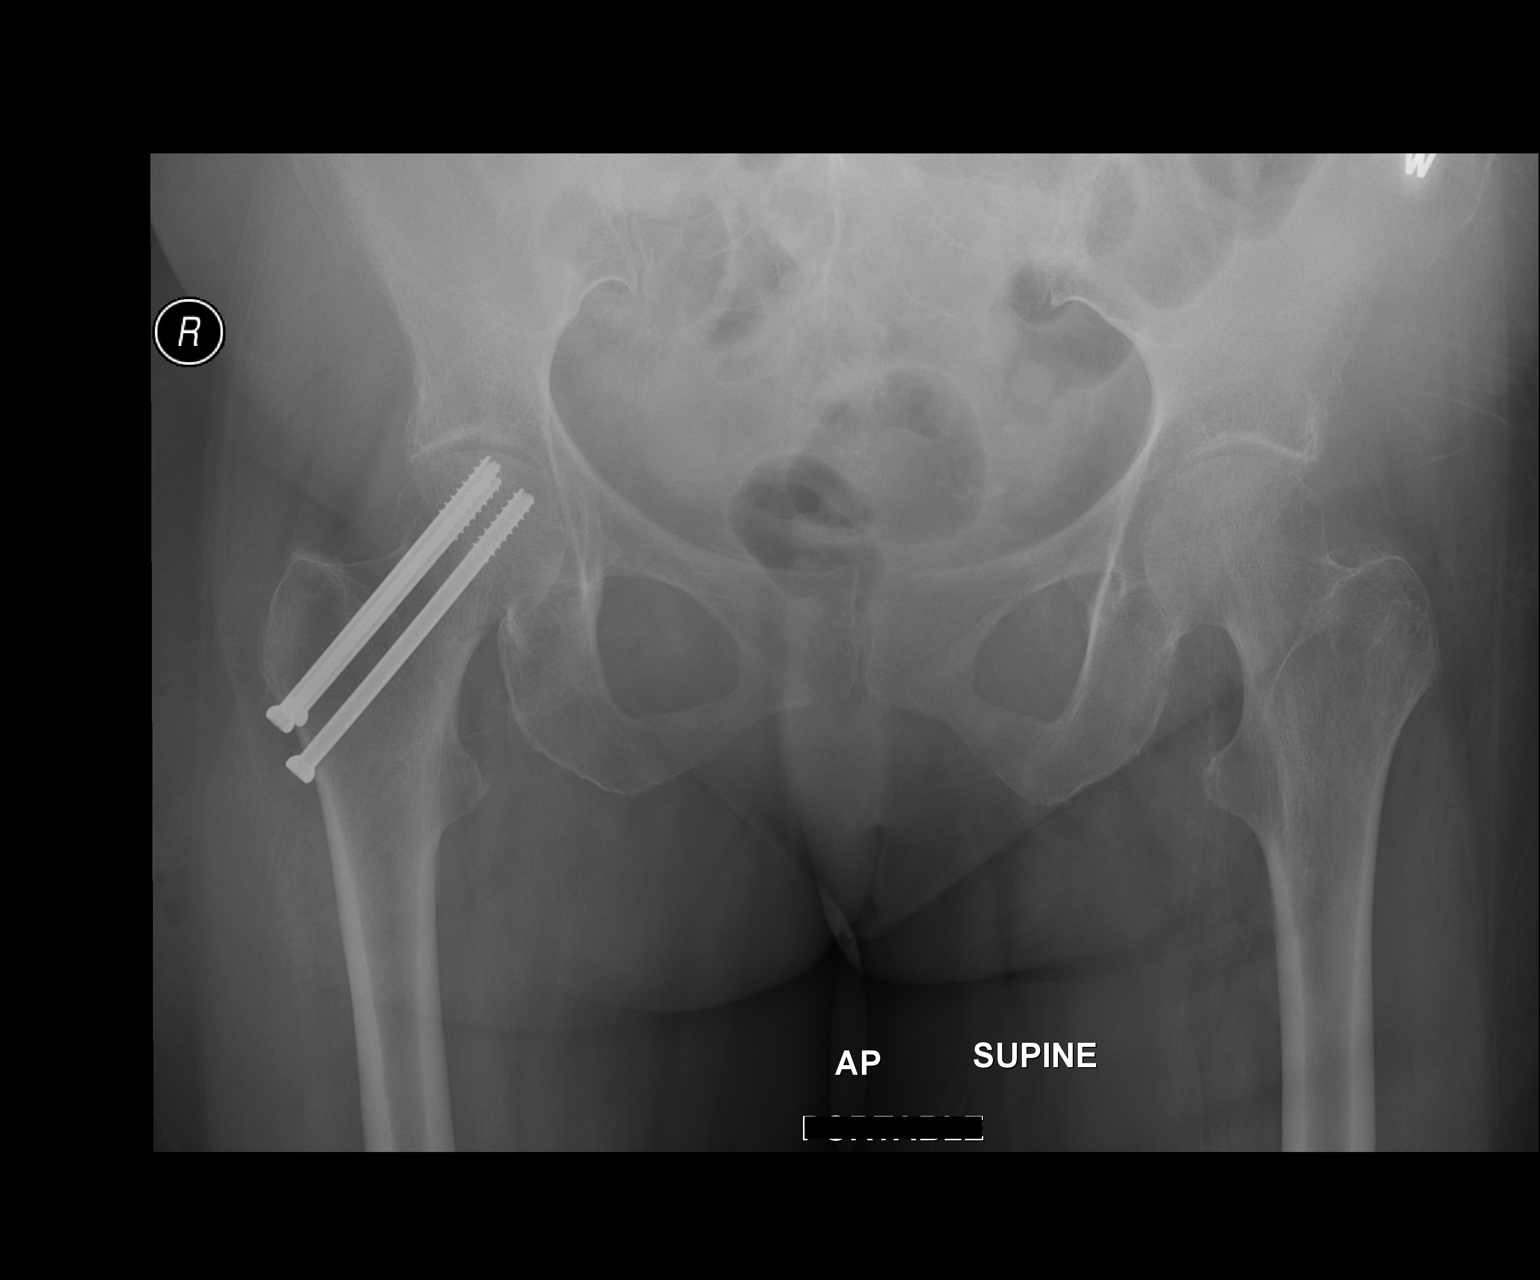

[1 of 1 positions shown; findings below may reference images not displayed]

FINDINGS: Three screws reduce a femoral neck fracture on the right. Fracture
is in near anatomic alignment with mild valgus angulation.
Orthopedic hardware is well-seated. No evidence of an operative
complication.
IMPRESSION: Status post ORIF of a right femoral neck fracture.

## 2015-09-30 IMAGING — RF DG HIP OPERATIVE*R*
1 series · 2 of 2 positions shown · non-contrast
Comparison: 09/18/2013

CLINICAL DATA: Operative images from the ORIF of a right subcapital
femoral neck fracture.

EXAM:
DG OPERATIVE RIGHT HIP
TECHNIQUE: A single spot fluoroscopic AP image of the right hip is submitted.

[Series 1: run · 2 of 2 slices shown]
[im 1/2]
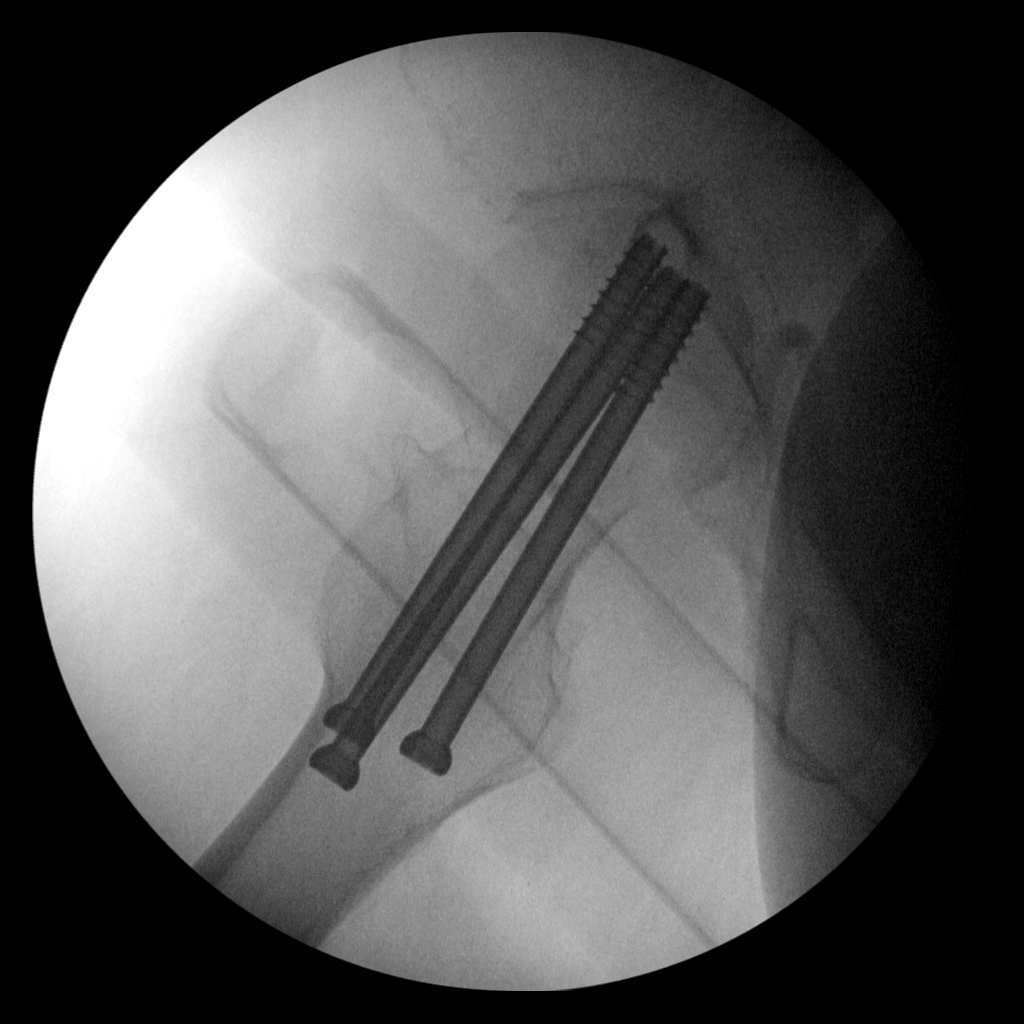
[im 2/2]
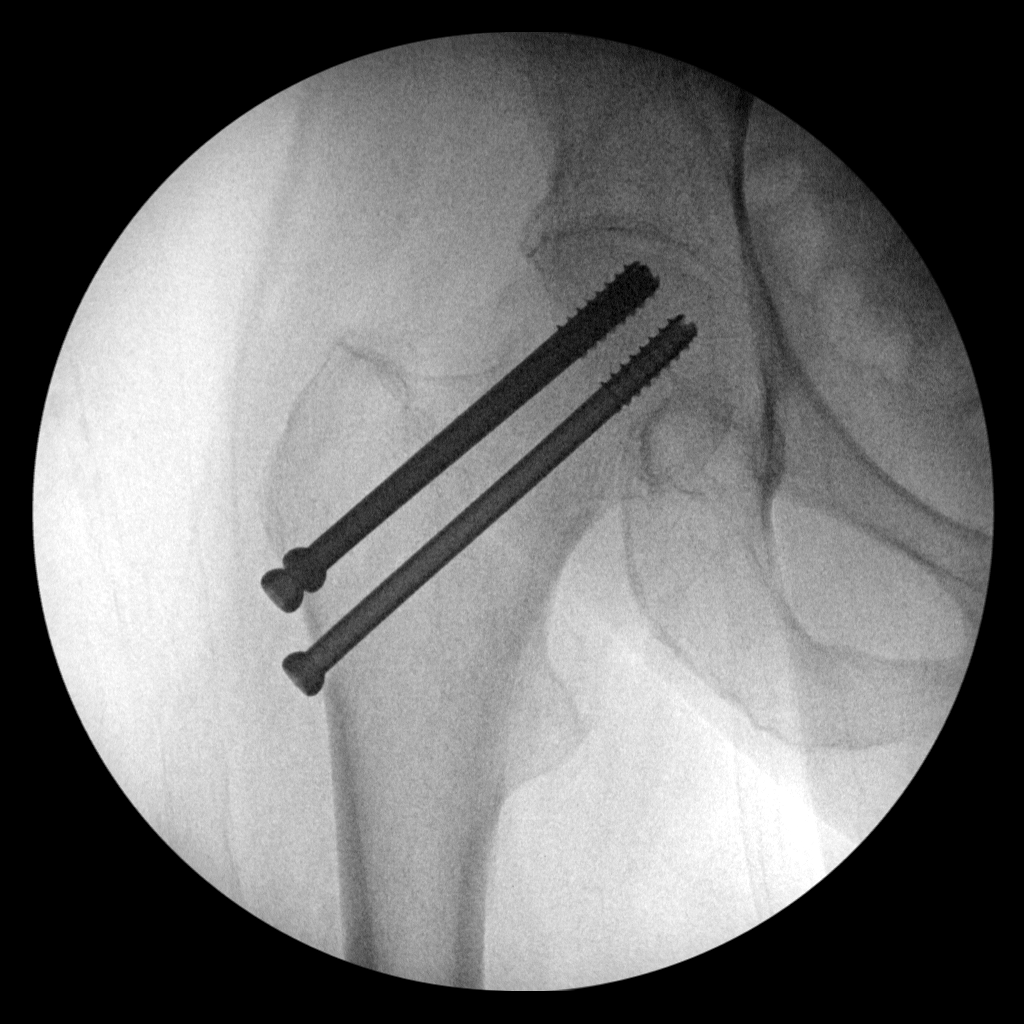

[2 of 2 positions shown; findings below may reference images not displayed]

FINDINGS: Three screws reduce the subcapital fracture into near anatomic
alignment. Mild residual valgus angulation is noted. Orthopedic
hardware is well-seated. No evidence of a new fracture or operative
complication.
IMPRESSION: ORIF of a right subcapital femoral neck fracture as described.
